# Patient Record
Sex: Female | Born: 1995 | Race: Black or African American | Hispanic: No | Marital: Married | Smoking: Never smoker
Health system: Southern US, Community
[De-identification: ages and names within clinical notes are randomized; demographics above are authoritative.]

## PROBLEM LIST (undated history)

## (undated) ENCOUNTER — Inpatient Hospital Stay (HOSPITAL_COMMUNITY): Payer: Self-pay

## (undated) ENCOUNTER — Ambulatory Visit: Admission: EM | Payer: BC Managed Care – PPO | Source: Home / Self Care

## (undated) DIAGNOSIS — R519 Headache, unspecified: Secondary | ICD-10-CM

## (undated) DIAGNOSIS — R51 Headache: Secondary | ICD-10-CM

## (undated) DIAGNOSIS — E559 Vitamin D deficiency, unspecified: Secondary | ICD-10-CM

## (undated) HISTORY — DX: Headache, unspecified: R51.9

## (undated) HISTORY — PX: NO PAST SURGERIES: SHX2092

## (undated) HISTORY — DX: Headache: R51

## (undated) HISTORY — DX: Vitamin D deficiency, unspecified: E55.9

---
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
## 2013-11-24 ENCOUNTER — Emergency Department (HOSPITAL_COMMUNITY): Payer: Medicaid Other

## 2013-11-24 ENCOUNTER — Emergency Department (HOSPITAL_COMMUNITY)
Admission: EM | Admit: 2013-11-24 | Discharge: 2013-11-24 | Disposition: A | Discharge status: Home / Self Care | Payer: Medicaid Other | Attending: Emergency Medicine | Admitting: Emergency Medicine

## 2013-11-24 ENCOUNTER — Encounter (HOSPITAL_COMMUNITY): Payer: Self-pay | Admitting: Emergency Medicine

## 2013-11-24 DIAGNOSIS — R040 Epistaxis: Secondary | ICD-10-CM | POA: Insufficient documentation

## 2013-11-24 DIAGNOSIS — Z79899 Other long term (current) drug therapy: Secondary | ICD-10-CM | POA: Insufficient documentation

## 2013-11-24 DIAGNOSIS — J3489 Other specified disorders of nose and nasal sinuses: Secondary | ICD-10-CM | POA: Insufficient documentation

## 2013-11-24 DIAGNOSIS — R51 Headache: Secondary | ICD-10-CM | POA: Insufficient documentation

## 2013-11-24 MED ORDER — OXYMETAZOLINE HCL 0.05 % NA SOLN: 1.0000 | Freq: Two times a day (BID) | NASAL | Status: DC

## 2013-11-24 NOTE — ED Notes (Signed)
Patient presents with c/o nosebleed off and on and has a headache

## 2013-11-24 NOTE — ED Provider Notes (Signed)
CSN: [id number]     Arrival date & time 11/24/13  0445 History   First MD Initiated Contact with Patient 11/24/13 [phone]     Chief Complaint  Patient presents with  . Epistaxis  . Headache     (Consider location/radiation/quality/duration/timing/severity/associated sxs/prior Treatment) HPI Comments: Patient presents with intermittent nosebleeds for the past 2 days. States she has noticed blood with clots from her right nostril mostly that is controlled with pressure. No bleeding currently. She endorses gradual onset headache and no focal weakness, numbness or tingling. No fever. Denies any recent illness. Denies any sore throat, runny nose, chest pain or shortness of breath.  The history is provided by the patient.    History reviewed. No pertinent past medical history. History reviewed. No pertinent past surgical history. History reviewed. No pertinent family history. History  Substance Use Topics  . Smoking status: Never Smoker   . Smokeless tobacco: Never Used  . Alcohol Use: No   OB History   Grav Para Term Preterm Abortions TAB SAB Ect Mult Living   1 1 1       1      Review of Systems  Constitutional: Negative for fever, activity change and appetite change.  HENT: Positive for congestion, nosebleeds and rhinorrhea.   Respiratory: Negative for cough, chest tightness and shortness of breath.   Cardiovascular: Negative for chest pain.  Gastrointestinal: Negative for nausea, vomiting and abdominal pain.  Genitourinary: Negative for dysuria, hematuria, vaginal bleeding and vaginal discharge.  Musculoskeletal: Negative for arthralgias and myalgias.  Skin: Negative for rash.  Neurological: Positive for headaches. Negative for dizziness and weakness.  A complete 10 system review of systems was obtained and all systems are negative except as noted in the HPI and PMH.      Allergies  Review of patient's allergies indicates no known allergies.  Home Medications   Prior to  Admission medications   Medication Sig Start Date End Date Taking? Authorizing Provider  oxymetazoline (AFRIN NASAL SPRAY) 0.05 % nasal spray Place 1 spray into both nostrils 2 (two) times daily. 11/24/13   Glynn OctaveStephen Ciani Rutten, MD   BP 118/65  Pulse 66  Temp(Src) 98.3 F (36.8 C) (Oral)  Resp 15  Ht 5\' 6"  (1.676 m)  Wt 178 lb (80.74 kg)  BMI 28.74 kg/m2  SpO2 99%  LMP 11/24/2013 Physical Exam  Nursing note and vitals reviewed. Constitutional: She is oriented to person, place, and time. She appears well-developed and well-nourished. No distress.  HENT:  Head: Normocephalic and atraumatic.  Right Ear: External ear normal.  Left Ear: External ear normal.  Mouth/Throat: Oropharynx is clear and moist. No oropharyngeal exudate.  No active bleeding from bilateral nares. No evidence of recent bleed. No septal hematoma. Oropharynx is clear.  Eyes: Conjunctivae and EOM are normal. Pupils are equal, round, and reactive to light.  Neck: Normal range of motion. Neck supple.  No meningismus.  Cardiovascular: Normal rate, regular rhythm, normal heart sounds and intact distal pulses.   No murmur heard. Pulmonary/Chest: Effort normal and breath sounds normal. No respiratory distress.  Abdominal: Soft. There is no tenderness. There is no rebound and no guarding.  Musculoskeletal: Normal range of motion. She exhibits no edema and no tenderness.  Neurological: She is alert and oriented to person, place, and time. No cranial nerve deficit. She exhibits normal muscle tone. Coordination normal.  No ataxia on finger to nose bilaterally. No pronator drift. 5/5 strength throughout. CN 2-12 intact. Negative Romberg. Equal grip strength. Sensation intact.  Gait is normal.   Skin: Skin is warm.  Psychiatric: She has a normal mood and affect. Her behavior is normal.    ED Course  Procedures (including critical care time) Labs Review Labs Reviewed - No data to display  Imaging Review Dg Chest 2  View  11/24/2013   CLINICAL DATA:  Headache.  Epistaxis.  EXAM: CHEST  2 VIEW  COMPARISON:  None.  FINDINGS: The heart size and mediastinal contours are within normal limits. Both lungs are clear. No pleural effusion or pneumothorax. The visualized skeletal structures are unremarkable.  IMPRESSION: Normal chest radiographs.   Electronically Signed   By: Amie Portland M.D.   On: 11/24/2013 09:08     EKG Interpretation None      MDM   Final diagnoses:  Epistaxis   Intermittent nosebleeds for the past 2 days. Denies trauma. No anticoagulant use. Bleeding controlled on arrival.  No evidence of septal hematoma or hemotympanum.   Patient in no distress  Supportive care and with Afrin and nasal saline. Follow up with PCP.  Glynn Octave, MD 11/24/13 [phone]

## 2013-11-24 NOTE — Discharge Instructions (Signed)

## 2014-02-10 ENCOUNTER — Encounter (HOSPITAL_COMMUNITY): Payer: Self-pay | Admitting: Emergency Medicine

## 2014-06-04 ENCOUNTER — Other Ambulatory Visit: Payer: Self-pay | Admitting: Obstetrics and Gynecology

## 2014-06-05 LAB — CYTOLOGY - PAP

## 2014-07-02 ENCOUNTER — Other Ambulatory Visit: Payer: Self-pay | Admitting: Obstetrics and Gynecology

## 2014-07-24 ENCOUNTER — Ambulatory Visit (INDEPENDENT_AMBULATORY_CARE_PROVIDER_SITE_OTHER): Payer: BLUE CROSS/BLUE SHIELD | Admitting: Neurology

## 2014-07-24 ENCOUNTER — Encounter: Payer: Self-pay | Admitting: Neurology

## 2014-07-24 VITALS — BP 123/79 | HR 73 | Ht 66.0 in | Wt 192.0 lb

## 2014-07-24 DIAGNOSIS — G43009 Migraine without aura, not intractable, without status migrainosus: Secondary | ICD-10-CM | POA: Insufficient documentation

## 2014-07-24 MED ORDER — NORTRIPTYLINE HCL 10 MG PO CAPS: ORAL_CAPSULE | ORAL | Status: DC

## 2014-07-24 MED ORDER — RIZATRIPTAN BENZOATE 5 MG PO TBDP: 5.0000 mg | ORAL_TABLET | ORAL | Status: DC | PRN

## 2014-07-24 NOTE — Progress Notes (Signed)
PATIENT: Wendy Gutierrez DOB: 08/05/1995  HISTORICAL  Wendy Gutierrez is a 19 yo RH AAM, by her gynecologist. Wendy Gutierrez for evaluation of headaches  She denied previous history of headaches, since March 2016, she began to have headaches, initial headache last for 2 weeks, holoacranial severe pounding headaches with associated light noise sensitivity, nauseous, she denies visual change, denies hearing loss, headache gradually improved,  However over the past 1 week, she also had intermittent moderate to severe headaches 2 days in a row, during intense headaches, she saw color distortion,  She is a mother of 89 years old, works at irregular hours, lack of sleep, complains of weight gain, blurry vision,   Laboratory evaluation in February 2016 showed normal TSH, CBC CMP, B12, A1c 5.8, mildly low vitamin D 11  REVIEW OF SYSTEMS: Full 14 system review of systems performed and notable only for not enough sleep, headaches, dizziness, increased thirst, frequent urination, blurry vision, weight gain ALLERGIES: No Known Allergies  HOME MEDICATIONS: Current Outpatient Prescriptions  Medication Sig Dispense Refill  . ergocalciferol (VITAMIN D2) 50000 UNITS capsule Take 50,000 Units by mouth once a week.       PAST MEDICAL HISTORY: Past Medical History  Diagnosis Date  . Vitamin D deficiency   . Headache     PAST SURGICAL HISTORY: Past Surgical History  Procedure Laterality Date  . No past surgeries      FAMILY HISTORY: Family History  Problem Relation Age of Onset  . Healthy Mother   . Healthy Father     SOCIAL HISTORY:  History   Social History  . Marital Status: Single    Spouse Name: N/A  . Number of Children: 0  . Years of Education: 13   Occupational History  . Bakery   . Hostess    Social History Main Topics  . Smoking status: Never Smoker   . Smokeless tobacco: Never Used  . Alcohol Use: No  . Drug Use: No  . Sexual Activity: Yes   Other Topics Concern  .  Not on file   Social History Narrative   Lives at home with mother.   Right-handed.   2-3 cups caffeine per day.   PHYSICAL EXAM   Filed Vitals:   07/24/14 0831  BP: 123/79  Pulse: 73  Height: 5\' 6"  (1.676 m)  Weight: 192 lb (87.091 kg)    Not recorded      Body mass index is 31 kg/(m^2).  PHYSICAL EXAMNIATION:  Gen: NAD, conversant, well nourised, obese, well groomed                     Cardiovascular: Regular rate rhythm, no peripheral edema, warm, nontender. Eyes: Conjunctivae clear without exudates or hemorrhage Neck: Supple, no carotid bruise. Pulmonary: Clear to auscultation bilaterally   NEUROLOGICAL EXAM:  MENTAL STATUS: Speech:    Speech is normal; fluent and spontaneous with normal comprehension.  Cognition:    The patient is oriented to person, place, and time;     recent and remote memory intact;     language fluent;     normal attention, concentration,     fund of knowledge.  CRANIAL NERVES: CN II: Visual fields are full to confrontation. Fundoscopic exam is normal with sharp discs and no vascular changes. Venous pulsations are present bilaterally. Pupils are 4 mm and briskly reactive to light. Visual acuity is 20/20 bilaterally. CN III, IV, VI: extraocular movement are normal. No ptosis. CN V: Facial sensation is intact  to pinprick in all 3 divisions bilaterally. Corneal responses are intact.  CN VII: Face is symmetric with normal eye closure and smile. CN VIII: Hearing is normal to rubbing fingers CN IX, X: Palate elevates symmetrically. Phonation is normal. CN XI: Head turning and shoulder shrug are intact CN XII: Tongue is midline with normal movements and no atrophy.  MOTOR: There is no pronator drift of out-stretched arms. Muscle bulk and tone are normal. Muscle strength is normal.   Shoulder abduction Shoulder external rotation Elbow flexion Elbow extension Wrist flexion Wrist extension Finger abduction Hip flexion Knee flexion Knee extension  Ankle dorsi flexion Ankle plantar flexion  R L REFLEXES: Reflexes are 2+ and symmetric at the biceps, triceps, knees, and ankles. Plantar responses are flexor.  SENSORY: Light touch, pinprick, position sense, and vibration sense are intact in fingers and toes.  COORDINATION: Rapid alternating movements and fine finger movements are intact. There is no dysmetria on finger-to-nose and heel-knee-shin. There are no abnormal or extraneous movements.   GAIT/STANCE: Posture is normal. Gait is steady with normal steps, base, arm swing, and turning. Heel and toe walking are normal. Tandem gait is normal.  Romberg is absent.   DIAGNOSTIC DATA (LABS, IMAGING, TESTING) - I reviewed patient records, labs, notes, testing and imaging myself where available.  Lab Results  Component Value Date   WBC 28.0* 12/06/2011   HGB 11.4* 12/06/2011   HCT 34.1* 12/06/2011   MCV 86.3 12/06/2011   PLT 123* 12/06/2011      Component Value Date/Time   NA 138 07/03/2011 2328   K 4.0 07/03/2011 2328   CL 107 07/03/2011 2328   GLUCOSE 82 07/03/2011 2328   BUN 9 07/03/2011 2328   CREATININE 0.60 07/03/2011 2328    ASSESSMENT AND PLAN  Teriann Livingood is a 19 y.o. female  with migraine headaches, normal neurological examination, I do not see papillary edema today  1, preventive medications nortriptyline, titrating to 10 mg 2 tablets every night 2. Maxalt as needed 3. Return to clinic in 1-2 months  Levert Feinstein, M.D. Ph.D.  [facility] [address], [address] [place], [place] [address] Ph: [phone] Fax: [phone]

## 2014-09-04 ENCOUNTER — Telehealth: Payer: Self-pay | Admitting: *Deleted

## 2014-09-04 ENCOUNTER — Ambulatory Visit: Payer: BLUE CROSS/BLUE SHIELD | Admitting: Neurology

## 2014-09-04 NOTE — Telephone Encounter (Signed)
Patient no showed her appt today

## 2014-09-05 ENCOUNTER — Encounter: Payer: Self-pay | Admitting: Neurology

## 2014-09-24 ENCOUNTER — Ambulatory Visit: Payer: BLUE CROSS/BLUE SHIELD | Admitting: Neurology

## 2014-09-24 ENCOUNTER — Telehealth: Payer: Self-pay | Admitting: *Deleted

## 2014-09-24 NOTE — Telephone Encounter (Signed)
No showed follow up appt. 

## 2014-09-25 ENCOUNTER — Encounter: Payer: Self-pay | Admitting: Neurology

## 2015-06-01 ENCOUNTER — Emergency Department (HOSPITAL_COMMUNITY)
Admission: EM | Admit: 2015-06-01 | Discharge: 2015-06-01 | Disposition: A | Discharge status: Home / Self Care | Payer: Medicaid Other | Attending: Emergency Medicine | Admitting: Emergency Medicine

## 2015-06-01 ENCOUNTER — Encounter (HOSPITAL_COMMUNITY): Payer: Self-pay | Admitting: Family Medicine

## 2015-06-01 DIAGNOSIS — B349 Viral infection, unspecified: Secondary | ICD-10-CM

## 2015-06-01 DIAGNOSIS — Z79899 Other long term (current) drug therapy: Secondary | ICD-10-CM | POA: Insufficient documentation

## 2015-06-01 DIAGNOSIS — R61 Generalized hyperhidrosis: Secondary | ICD-10-CM | POA: Insufficient documentation

## 2015-06-01 DIAGNOSIS — J069 Acute upper respiratory infection, unspecified: Secondary | ICD-10-CM

## 2015-06-01 DIAGNOSIS — E559 Vitamin D deficiency, unspecified: Secondary | ICD-10-CM | POA: Insufficient documentation

## 2015-06-01 MED ORDER — FLUTICASONE PROPIONATE 50 MCG/ACT NA SUSP: 2.0000 | Freq: Every day | NASAL | Status: DC

## 2015-06-01 MED ORDER — ALBUTEROL SULFATE HFA 108 (90 BASE) MCG/ACT IN AERS
2.0000 | INHALATION_SPRAY | Freq: Once | RESPIRATORY_TRACT | Status: AC
  Administered 2015-06-01: 2 via RESPIRATORY_TRACT
  Filled 2015-06-01: qty 6.7

## 2015-06-01 MED ORDER — CETIRIZINE-PSEUDOEPHEDRINE ER 5-120 MG PO TB12: 1.0000 | ORAL_TABLET | Freq: Two times a day (BID) | ORAL | Status: DC

## 2015-06-01 MED ORDER — BENZONATATE 100 MG PO CAPS: 100.0000 mg | ORAL_CAPSULE | Freq: Three times a day (TID) | ORAL | Status: DC | PRN

## 2015-06-01 MED ORDER — NAPROXEN 500 MG PO TABS: 500.0000 mg | ORAL_TABLET | Freq: Two times a day (BID) | ORAL | Status: DC

## 2015-06-01 MED ORDER — SALINE SPRAY 0.65 % NA SOLN: 1.0000 | NASAL | Status: DC | PRN

## 2015-06-01 NOTE — ED Provider Notes (Signed)
CSN: [id number]     Arrival date & time 06/01/15  1834 History  By signing my name below, I, Wendy Gutierrez, attest that this documentation has been prepared under the direction and in the presence of [employer], PA-C. Electronically Signed: Gonzella Gutierrez, Scribe. 06/01/2015. 9:33 PM.   Chief Complaint  Patient presents with  . Nasal Congestion  . Cough  . Headache   The history is provided by the patient. No language interpreter was used.   HPI Comments: Wendy Gutierrez is a 20 y.o. female who presents to the Emergency Department complaining of sudden onset, constant, mild nasal congestion, nonproductive cough, diaphoresis, and HA which began three days ago. She also reports associated SOB, chills, generalized body aches, and mild sore throat in the morning. Pt has tried taking Theraflu, Tylenol, and Mucinex with no relief. She also notes that her niece was told four days ago that she had the flu. Pt denies vomiting and diarrhea. She also denies hx of asthma.  Past Medical History  Diagnosis Date  . Vitamin D deficiency   . Headache    Past Surgical History  Procedure Laterality Date  . No past surgeries     Family History  Problem Relation Age of Onset  . Healthy Mother   . Healthy Father    Social History  Substance Use Topics  . Smoking status: Never Smoker   . Smokeless tobacco: Never Used  . Alcohol Use: No   OB History    Gravida Para Term Preterm AB TAB SAB Ectopic Multiple Living   Review of Systems  Constitutional: Positive for chills and diaphoresis.  HENT: Positive for congestion and sore throat.   Respiratory: Positive for cough and shortness of breath.   Gastrointestinal: Negative for vomiting and diarrhea.  Musculoskeletal: Positive for myalgias ( generalized body aches).  Neurological: Positive for headaches.  All other systems reviewed and are negative.   Allergies  Review of patient's allergies indicates no known  allergies.  Home Medications   Prior to Admission medications   Medication Sig Start Date End Date Taking? Authorizing Provider  ergocalciferol (VITAMIN D2) 50000 UNITS capsule Take 50,000 Units by mouth once a week.    Historical Provider, MD  nortriptyline (PAMELOR) 10 MG capsule One po qhs xone week, then 2 tabs po qhs 07/24/14   Levert Feinstein, MD  rizatriptan (MAXALT-MLT) 5 MG disintegrating tablet Take 1 tablet (5 mg total) by mouth as needed. May repeat in 2 hours if needed 07/24/14   Levert Feinstein, MD  UNABLE TO FIND Med Name: Hydroxycut Weight Loss Tablets    Historical Provider, MD   BP 106/76 mmHg  Pulse 77  Temp(Src) 98.3 F (36.8 C) (Oral)  Resp 16  Ht  (1.651 m)  Wt 192 lb (87.091 kg)  BMI 31.95 kg/m2  SpO2 100%  LMP 05/18/2015   Physical Exam  Constitutional: She is oriented to person, place, and time. She appears well-developed and well-nourished. No distress.  Nontoxic/nonseptic appearing  HENT:  Head: Normocephalic and atraumatic.  Mouth/Throat: Oropharynx is clear and moist. No oropharyngeal exudate.  Oropharynx clear. Uvula midline. Patient tolerating secretions without difficulty.  Eyes: Conjunctivae and EOM are normal. No scleral icterus.  Neck: Normal range of motion.  No nuchal rigidity or meningismus  Cardiovascular: Normal rate, regular rhythm and intact distal pulses.   Pulmonary/Chest: Effort normal and breath sounds normal. No respiratory distress.  She has no wheezes. She has no rales.  No accessory muscle use, tachypnea, or dyspnea. Lungs clear to auscultation bilaterally. Chest expansion symmetric.  Musculoskeletal: Normal range of motion.  Neurological: She is alert and oriented to person, place, and time. She exhibits normal muscle tone. Coordination normal.  Ambulatory with steady gait  Skin: Skin is warm and dry. No rash noted. She is not diaphoretic. No erythema. No pallor.  Psychiatric: She has a normal mood and affect. Her behavior is normal.   Nursing note and vitals reviewed.   ED Course  Procedures  DIAGNOSTIC STUDIES:    Oxygen Saturation is 100% on RA, normal by my interpretation.   COORDINATION OF CARE:  9:33 PM Will prescribe pt nasal spray and inhaler. Discussed treatment plan with pt at bedside and pt agreed to plan.   MDM   Final diagnoses:  Viral illness  Acute URI    Patient with symptoms consistent with viral illness, possible influenza. Vitals are stable, patient afebrile. No signs of dehydration, tolerating PO's. Lungs are clear. Patient out of the window for tx with Tamiflu. She will be discharged with instructions to orally hydrate, rest, and use over-the-counter medications such as anti-inflammatories ibuprofen and Aleve for muscle aches and Tylenol for fever.  Patient will also be given a cough suppressant.   I personally performed the services described in this documentation, which was scribed in my presence. The recorded information has been reviewed and is accurate.    Filed Vitals:   06/01/15 2008 06/01/15 2009 06/01/15 2201  BP: 106/76  119/69  Pulse: 77  72  Temp: 98.3 F (36.8 C)    TempSrc: Oral    Resp: 16  16  Height:   (1.651 m)   Weight:  87.091 kg   SpO2: 100%  99%    Antony Madura, PA-C 06/02/15 1907  Bethann Berkshire, MD 06/04/15 [phone]

## 2015-06-01 NOTE — ED Notes (Signed)
Patient is complaining of nasal congestion, non-productive cough, sweating, and headache. Symptoms started on Friday. Pt has tried Thera-Flu, Tylenol, and Mucinex.

## 2015-06-01 NOTE — Discharge Instructions (Signed)
Upper Respiratory Infection, Adult Most upper respiratory infections (URIs) are a viral infection of the air passages leading to the lungs. A URI affects the nose, throat, and upper air passages. The most common type of URI is nasopharyngitis and is typically referred to as "the common cold." URIs run their course and usually go away on their own. Most of the time, a URI does not require medical attention, but sometimes a bacterial infection in the upper airways can follow a viral infection. This is called a secondary infection. Sinus and middle ear infections are common types of secondary upper respiratory infections. Bacterial pneumonia can also complicate a URI. A URI can worsen asthma and chronic obstructive pulmonary disease (COPD). Sometimes, these complications can require emergency medical care and may be life threatening.  CAUSES Almost all URIs are caused by viruses. A virus is a type of germ and can spread from one person to another.  RISKS FACTORS You may be at risk for a URI if:   You smoke.   You have chronic heart or lung disease.  You have a weakened defense (immune) system.   You are very young or very old.   You have nasal allergies or asthma.  You work in crowded or poorly ventilated areas.  You work in health care facilities or schools. SIGNS AND SYMPTOMS  Symptoms typically develop 2-3 days after you come in contact with a cold virus. Most viral URIs last 7-10 days. However, viral URIs from the influenza virus (flu virus) can last 14-18 days and are typically more severe. Symptoms may include:   Runny or stuffy (congested) nose.   Sneezing.   Cough.   Sore throat.   Headache.   Fatigue.   Fever.   Loss of appetite.   Pain in your forehead, behind your eyes, and over your cheekbones (sinus pain).  Muscle aches.  DIAGNOSIS  Your health care provider may diagnose a URI by:  Physical exam.  Tests to check that your symptoms are not due to  another condition such as:  Strep throat.  Sinusitis.  Pneumonia.  Asthma. TREATMENT  A URI goes away on its own with time. It cannot be cured with medicines, but medicines may be prescribed or recommended to relieve symptoms. Medicines may help:  Reduce your fever.  Reduce your cough.  Relieve nasal congestion. HOME CARE INSTRUCTIONS   Take medicines only as directed by your health care provider.   Gargle warm saltwater or take cough drops to comfort your throat as directed by your health care provider.  Use a warm mist humidifier or inhale steam from a shower to increase air moisture. This may make it easier to breathe.  Drink enough fluid to keep your urine clear or pale yellow.   Eat soups and other clear broths and maintain good nutrition.   Rest as needed.   Return to work when your temperature has returned to normal or as your health care provider advises. You may need to stay home longer to avoid infecting others. You can also use a face mask and careful hand washing to prevent spread of the virus.  Increase the usage of your inhaler if you have asthma.   Do not use any tobacco products, including cigarettes, chewing tobacco, or electronic cigarettes. If you need help quitting, ask your health care provider. PREVENTION  The best way to protect yourself from getting a cold is to practice good hygiene.   Avoid oral or hand contact with people with cold   symptoms.   Wash your hands often if contact occurs.  There is no clear evidence that vitamin C, vitamin E, echinacea, or exercise reduces the chance of developing a cold. However, it is always recommended to get plenty of rest, exercise, and practice good nutrition.  SEEK MEDICAL CARE IF:   You are getting worse rather than better.   Your symptoms are not controlled by medicine.   You have chills.  You have worsening shortness of breath.  You have brown or red mucus.  You have yellow or brown nasal  discharge.  You have pain in your face, especially when you bend forward.  You have a fever.  You have swollen neck glands.  You have pain while swallowing.  You have white areas in the back of your throat. SEEK IMMEDIATE MEDICAL CARE IF:   You have severe or persistent:  Headache.  Ear pain.  Sinus pain.  Chest pain.  You have chronic lung disease and any of the following:  Wheezing.  Prolonged cough.  Coughing up blood.  A change in your usual mucus.  You have a stiff neck.  You have changes in your:  Vision.  Hearing.  Thinking.  Mood. MAKE SURE YOU:   Understand these instructions.  Will watch your condition.  Will get help right away if you are not doing well or get worse.   This information is not intended to replace advice given to you by your health care provider. Make sure you discuss any questions you have with your health care provider.   Document Released: 09/21/2000 Document Revised: 08/12/2014 Document Reviewed: 07/03/2013 Elsevier Interactive Patient Education 2016 [employer]  

## 2016-09-29 DIAGNOSIS — Z3490 Encounter for supervision of normal pregnancy, unspecified, unspecified trimester: Secondary | ICD-10-CM | POA: Insufficient documentation

## 2016-10-10 DIAGNOSIS — R87612 Low grade squamous intraepithelial lesion on cytologic smear of cervix (LGSIL): Secondary | ICD-10-CM | POA: Insufficient documentation

## 2016-11-09 ENCOUNTER — Encounter (HOSPITAL_COMMUNITY): Payer: Self-pay | Admitting: *Deleted

## 2016-11-09 ENCOUNTER — Inpatient Hospital Stay (HOSPITAL_COMMUNITY)
Admission: AD | Admit: 2016-11-09 | Discharge: 2016-11-09 | Disposition: A | Discharge status: Home / Self Care | Payer: Medicaid Other | Source: Ambulatory Visit | Attending: Family Medicine | Admitting: Family Medicine

## 2016-11-09 DIAGNOSIS — R109 Unspecified abdominal pain: Secondary | ICD-10-CM

## 2016-11-09 DIAGNOSIS — O26892 Other specified pregnancy related conditions, second trimester: Secondary | ICD-10-CM

## 2016-11-09 DIAGNOSIS — O23592 Infection of other part of genital tract in pregnancy, second trimester: Secondary | ICD-10-CM | POA: Diagnosis not present

## 2016-11-09 DIAGNOSIS — Z3A14 14 weeks gestation of pregnancy: Secondary | ICD-10-CM | POA: Insufficient documentation

## 2016-11-09 DIAGNOSIS — R1030 Lower abdominal pain, unspecified: Secondary | ICD-10-CM | POA: Insufficient documentation

## 2016-11-09 DIAGNOSIS — B9689 Other specified bacterial agents as the cause of diseases classified elsewhere: Secondary | ICD-10-CM | POA: Diagnosis not present

## 2016-11-09 DIAGNOSIS — N760 Acute vaginitis: Secondary | ICD-10-CM

## 2016-11-09 DIAGNOSIS — O26899 Other specified pregnancy related conditions, unspecified trimester: Secondary | ICD-10-CM

## 2016-11-09 LAB — URINALYSIS, ROUTINE W REFLEX MICROSCOPIC
BACTERIA UA: NONE SEEN
Bilirubin Urine: NEGATIVE
GLUCOSE, UA: NEGATIVE mg/dL
Hgb urine dipstick: NEGATIVE
KETONES UR: NEGATIVE mg/dL
Nitrite: NEGATIVE
Protein, ur: NEGATIVE mg/dL
Specific Gravity, Urine: 1.027 (ref 1.005–1.030)
pH: 5 (ref 5.0–8.0)

## 2016-11-09 LAB — WET PREP, GENITAL
SPERM: NONE SEEN
TRICH WET PREP: NONE SEEN
Yeast Wet Prep HPF POC: NONE SEEN

## 2016-11-09 MED ORDER — METRONIDAZOLE 500 MG PO TABS: 500.0000 mg | ORAL_TABLET | Freq: Two times a day (BID) | ORAL | 0 refills | Status: DC

## 2016-11-09 NOTE — MAU Note (Signed)
Over 2 wks, pain in lower abd.  Mainly when she is standing and walking around.  Sometimes will have a little in her back.

## 2016-11-09 NOTE — Progress Notes (Signed)
Reviewed d/c papers regarding treatment for BV and [facility]

## 2016-11-09 NOTE — MAU Provider Note (Signed)
History     CSN: [id number]  Arrival date and time: 11/09/16 1211   First Provider Initiated Contact with Patient 11/09/16 1402      Chief Complaint  Patient presents with  . Abdominal Pain   HPI  [name] is 21 yo G2P1001 at 14.[redacted] wks gestation presenting with complaints of lower abdominal pain x 2 wks.  She states the pain increases with standing and walking.  She also complains of occasional back pain.  Patient and spouse verbalizes "need to leave immediately to be somewhere else".   Past Medical History:  Diagnosis Date  . Headache   . Vitamin D deficiency     Past Surgical History:  Procedure Laterality Date  . NO PAST SURGERIES      Family History  Problem Relation Age of Onset  . Healthy Mother   . Healthy Father     Social History  Substance Use Topics  . Smoking status: Never Smoker  . Smokeless tobacco: Never Used  . Alcohol use No    Allergies: No Known Allergies  Prescriptions Prior to Admission  Medication Sig Dispense Refill Last Dose  . Prenatal Vit-Fe Fumarate-FA (PRENATAL MULTIVITAMIN) TABS tablet Take 1 tablet by mouth daily at 12 noon.   Past Week at Unknown time  . benzonatate (TESSALON) 100 MG capsule Take 1 capsule (100 mg total) by mouth 3 (three) times daily as needed for cough. 21 capsule 0   . cetirizine-pseudoephedrine (ZYRTEC-D) 5-120 MG tablet Take 1 tablet by mouth 2 (two) times daily. 14 tablet 0   . ergocalciferol (VITAMIN D2) 50000 UNITS capsule Take 50,000 Units by mouth once a week.   Taking  . fluticasone (FLONASE) 50 MCG/ACT nasal spray Place 2 sprays into both nostrils daily. 16 g 0   . naproxen (NAPROSYN) 500 MG tablet Take 1 tablet (500 mg total) by mouth 2 (two) times daily. 30 tablet 0   . nortriptyline (PAMELOR) 10 MG capsule One po qhs xone week, then 2 tabs po qhs 60 capsule 11   . rizatriptan (MAXALT-MLT) 5 MG disintegrating tablet Take 1 tablet (5 mg total) by mouth as needed. May repeat in 2 hours if needed  15 tablet 6   . sodium chloride (OCEAN) 0.65 % SOLN nasal spray Place 1 spray into both nostrils as needed for congestion. 1 Bottle 0   . UNABLE TO FIND Med Name: Hydroxycut Weight Loss Tablets   Taking    Review of Systems  Constitutional: Negative.   HENT: Negative.   Eyes: Negative.   Respiratory: Negative.   Cardiovascular: Negative.   Gastrointestinal: Positive for abdominal pain.  Endocrine: Negative.   Genitourinary: Positive for pelvic pain.  Musculoskeletal: Negative.   Skin: Negative.   Allergic/Immunologic: Negative.   Neurological: Negative.   Hematological: Negative.   Psychiatric/Behavioral: Negative.    Physical Exam   Blood pressure 115/63, pulse 79, temperature 98.6 F (37 C), temperature source Oral, resp. rate 16, weight 202 lb 12 oz (92 kg), SpO2 100 %, unknown if currently breastfeeding.  Physical Exam  Constitutional: She is oriented to person, place, and time. She appears well-developed and well-nourished.  HENT:  Head: Normocephalic.  Eyes: Pupils are equal, round, and reactive to light.  Neck: Normal range of motion.  Cardiovascular: Normal rate, regular rhythm and normal heart sounds.   Respiratory: Effort normal and breath sounds normal.  GI: Soft. Bowel sounds are normal.  Genitourinary:  Genitourinary Comments: Uterus: gravid, S=D, cx; smooth, pink, no lesions, moederate amt  of thick, white d/c, closed/long/firm, no CMT or friability, no adnexal tenderness   Musculoskeletal: Normal range of motion.  Neurological: She is alert and oriented to person, place, and time. She has normal reflexes.  Skin: Skin is warm and dry.  Psychiatric: She has a normal mood and affect. Her behavior is normal. Judgment and thought content normal.    MAU Course  Procedures  MDM CCUA Wet Prep GC/CT HIV (+) FHTS by doppler 155 bpm  Results for orders placed or performed during the hospital encounter of 11/09/16 (from the past 24 hour(s))  Urinalysis,  Routine w reflex microscopic     Status: Abnormal   Collection Time: 11/09/16 12:24 PM  Result Value Ref Range   Color, Urine YELLOW YELLOW   APPearance HAZY (A) CLEAR   Specific Gravity, Urine 1.027 1.005 - 1.030   pH 5.0 5.0 - 8.0   Glucose, UA NEGATIVE NEGATIVE mg/dL   Hgb urine dipstick NEGATIVE NEGATIVE   Bilirubin Urine NEGATIVE NEGATIVE   Ketones, ur NEGATIVE NEGATIVE mg/dL   Protein, ur NEGATIVE NEGATIVE mg/dL   Nitrite NEGATIVE NEGATIVE   Leukocytes, UA TRACE (A) NEGATIVE   RBC / HPF 0-5 0 - 5 RBC/hpf   WBC, UA 0-5 0 - 5 WBC/hpf   Bacteria, UA NONE SEEN NONE SEEN   Squamous Epithelial / LPF 0-5 (A) NONE SEEN   Mucous PRESENT   Wet prep, genital     Status: Abnormal   Collection Time: 11/09/16  2:00 PM  Result Value Ref Range   Yeast Wet Prep HPF POC NONE SEEN NONE SEEN   Trich, Wet Prep NONE SEEN NONE SEEN   Clue Cells Wet Prep HPF POC PRESENT (A) NONE SEEN   WBC, Wet Prep HPF POC FEW (A) NONE SEEN   Sperm NONE SEEN    Assessment and Plan  Abdominal pain affecting pregnancy - Instructions on abd pain in preg given - Keep scheduled appt with [facility] on 8/6  Bacterial vaginitis - Rx for Flagyl 500 mg BID x 7 days - Instructions on BV given  Discharge home  Patient verbalized an understanding of the plan of care and agrees.  Wendy Moraolitta Winnona Wargo, MSN, CNM 11/09/2016, 2:02 PM

## 2016-11-09 NOTE — Progress Notes (Signed)
Wendy Gutierrez CNM discussed d/c plan with pt after exam. Written and verbal d/c instructions given and understanding voiced.

## 2016-11-11 LAB — GC/CHLAMYDIA PROBE AMP (~~LOC~~) NOT AT ARMC
Chlamydia: NEGATIVE
NEISSERIA GONORRHEA: NEGATIVE

## 2016-11-14 ENCOUNTER — Encounter: Payer: Self-pay | Admitting: Obstetrics and Gynecology

## 2016-11-14 ENCOUNTER — Ambulatory Visit (INDEPENDENT_AMBULATORY_CARE_PROVIDER_SITE_OTHER): Payer: Medicaid Other | Admitting: Obstetrics and Gynecology

## 2016-11-14 DIAGNOSIS — Z3480 Encounter for supervision of other normal pregnancy, unspecified trimester: Secondary | ICD-10-CM

## 2016-11-14 DIAGNOSIS — O99210 Obesity complicating pregnancy, unspecified trimester: Secondary | ICD-10-CM

## 2016-11-14 DIAGNOSIS — E669 Obesity, unspecified: Secondary | ICD-10-CM

## 2016-11-14 DIAGNOSIS — Z3482 Encounter for supervision of other normal pregnancy, second trimester: Secondary | ICD-10-CM

## 2016-11-14 DIAGNOSIS — O99212 Obesity complicating pregnancy, second trimester: Secondary | ICD-10-CM

## 2016-11-14 DIAGNOSIS — R87612 Low grade squamous intraepithelial lesion on cytologic smear of cervix (LGSIL): Secondary | ICD-10-CM | POA: Insufficient documentation

## 2016-11-14 MED ORDER — COMFORT FIT MATERNITY SUPP MED MISC: 0 refills | Status: DC

## 2016-11-14 NOTE — Progress Notes (Signed)
  Subjective:    Wendy Gutierrez is a G2P1001 6659w2d being seen today for her first obstetrical visit.  Her obstetrical history is significant for other normal pregnancy. Patient does intend to breast feed. Pregnancy history fully reviewed.  Patient reports pelvic and lower back pain.  Vitals:   11/14/16 1421  BP: 119/70  Pulse: 85  Weight: 202 lb 14.4 oz (92 kg)    HISTORY: OB History  Gravida Para Term Preterm AB Living  2 1 1     1   SAB TAB Ectopic Multiple Live Births          1    # Outcome Date GA Lbr Len/2nd Weight Sex Delivery Anes PTL Lv  2 Current           1 Term 12/05/11 6952w5d 14:00 / 00:28 6 lb 15.6 oz (3.165 kg) F Vag-Spont EPI  LIV     Past Medical History:  Diagnosis Date  . Headache   . Vitamin D deficiency    Past Surgical History:  Procedure Laterality Date  . NO PAST SURGERIES     Family History  Problem Relation Age of Onset  . Healthy Mother   . Healthy Father   . Breast cancer Maternal Grandmother      Exam    Uterus:   16- week size uterus      Assessment:    Pregnancy: G2P1001 Patient Active Problem List   Diagnosis Date Noted  . Encounter for supervision of other normal pregnancy, unspecified trimester 11/14/2016  . LGSIL on Pap smear of cervix 11/14/2016  . Migraine without aura and without status migrainosus, not intractable 07/24/2014        Plan:     Initial labs drawn. Prenatal vitamins. Problem list reviewed and updated. Pap smear and ob urine culture reviewed Rx pregnancy support belt provided to help with lower back discomfort. Also advised patient to stretch or join a maternity exercise class Genetic Screening discussed Quad Screen: ordered.  Ultrasound discussed; fetal survey: ordered.  Follow up in 4 weeks. 50% of 30 min visit spent on counseling and coordination of care.     Marshel Golubski 11/14/2016

## 2016-11-14 NOTE — Patient Instructions (Addendum)
 Second Trimester of Pregnancy The second trimester is from week 14 through week 27 (months 4 through 6). The second trimester is often a time when you feel your best. Your body has adjusted to being pregnant, and you begin to feel better physically. Usually, morning sickness has lessened or quit completely, you may have more energy, and you may have an increase in appetite. The second trimester is also a time when the fetus is growing rapidly. At the end of the sixth month, the fetus is about 9 inches long and weighs about 1 pounds. You will likely begin to feel the baby move (quickening) between 16 and 20 weeks of pregnancy. Body changes during your second trimester Your body continues to go through many changes during your second trimester. The changes vary from woman to woman.  Your weight will continue to increase. You will notice your lower abdomen bulging out.  You may begin to get stretch marks on your hips, abdomen, and breasts.  You may develop headaches that can be relieved by medicines. The medicines should be approved by your health care provider.  You may urinate more often because the fetus is pressing on your bladder.  You may develop or continue to have heartburn as a result of your pregnancy.  You may develop constipation because certain hormones are causing the muscles that push waste through your intestines to slow down.  You may develop hemorrhoids or swollen, bulging veins (varicose veins).  You may have back pain. This is caused by: ? Weight gain. ? Pregnancy hormones that are relaxing the joints in your pelvis. ? A shift in weight and the muscles that support your balance.  Your breasts will continue to grow and they will continue to become tender.  Your gums may bleed and may be sensitive to brushing and flossing.  Dark spots or blotches (chloasma, mask of pregnancy) may develop on your face. This will likely fade after the baby is born.  A dark line from  your belly button to the pubic area (linea nigra) may appear. This will likely fade after the baby is born.  You may have changes in your hair. These can include thickening of your hair, rapid growth, and changes in texture. Some women also have hair loss during or after pregnancy, or hair that feels dry or thin. Your hair will most likely return to normal after your baby is born.  What to expect at prenatal visits During a routine prenatal visit:  You will be weighed to make sure you and the fetus are growing normally.  Your blood pressure will be taken.  Your abdomen will be measured to track your baby's growth.  The fetal heartbeat will be listened to.  Any test results from the previous visit will be discussed.  Your health care provider may ask you:  How you are feeling.  If you are feeling the baby move.  If you have had any abnormal symptoms, such as leaking fluid, bleeding, severe headaches, or abdominal cramping.  If you are using any tobacco products, including cigarettes, chewing tobacco, and electronic cigarettes.  If you have any questions.  Other tests that may be performed during your second trimester include:  Blood tests that check for: ? Low iron levels (anemia). ? High blood sugar that affects pregnant women (gestational diabetes) between 24 and 28 weeks. ? Rh antibodies. This is to check for a protein on red blood cells (Rh factor).  Urine tests to check for infections, diabetes,   or protein in the urine.  An ultrasound to confirm the proper growth and development of the baby.  An amniocentesis to check for possible genetic problems.  Fetal screens for spina bifida and Down syndrome.  HIV (human immunodeficiency virus) testing. Routine prenatal testing includes screening for HIV, unless you choose not to have this test.  Follow these instructions at home: Medicines  Follow your health care provider's instructions regarding medicine use. Specific  medicines may be either safe or unsafe to take during pregnancy.  Take a prenatal vitamin that contains at least 600 micrograms (mcg) of folic acid.  If you develop constipation, try taking a stool softener if your health care provider approves. Eating and drinking  Eat a balanced diet that includes fresh fruits and vegetables, whole grains, good sources of protein such as meat, eggs, or tofu, and low-fat dairy. Your health care provider will help you determine the amount of weight gain that is right for you.  Avoid raw meat and uncooked cheese. These carry germs that can cause birth defects in the baby.  If you have low calcium intake from food, talk to your health care provider about whether you should take a daily calcium supplement.  Limit foods that are high in fat and processed sugars, such as fried and sweet foods.  To prevent constipation: ? Drink enough fluid to keep your urine clear or pale yellow. ? Eat foods that are high in fiber, such as fresh fruits and vegetables, whole grains, and beans. Activity  Exercise only as directed by your health care provider. Most women can continue their usual exercise routine during pregnancy. Try to exercise for 30 minutes at least 5 days a week. Stop exercising if you experience uterine contractions.  Avoid heavy lifting, wear low heel shoes, and practice good posture.  A sexual relationship may be continued unless your health care provider directs you otherwise. Relieving pain and discomfort  Wear a good support bra to prevent discomfort from breast tenderness.  Take warm sitz baths to soothe any pain or discomfort caused by hemorrhoids. Use hemorrhoid cream if your health care provider approves.  Rest with your legs elevated if you have leg cramps or low back pain.  If you develop varicose veins, wear support hose. Elevate your feet for 15 minutes, 3-4 times a day. Limit salt in your diet. Prenatal Care  Write down your questions.  Take them to your prenatal visits.  Keep all your prenatal visits as told by your health care provider. This is important. Safety  Wear your seat belt at all times when driving.  Make a list of emergency phone numbers, including numbers for family, friends, the hospital, and police and fire departments. General instructions  Ask your health care provider for a referral to a local prenatal education class. Begin classes no later than the beginning of month 6 of your pregnancy.  Ask for help if you have counseling or nutritional needs during pregnancy. Your health care provider can offer advice or refer you to specialists for help with various needs.  Do not use hot tubs, steam rooms, or saunas.  Do not douche or use tampons or scented sanitary pads.  Do not cross your legs for long periods of time.  Avoid cat litter boxes and soil used by cats. These carry germs that can cause birth defects in the baby and possibly loss of the fetus by miscarriage or stillbirth.  Avoid all smoking, herbs, alcohol, and unprescribed drugs. Chemicals in these products   can affect the formation and growth of the baby.  Do not use any products that contain nicotine or tobacco, such as cigarettes and e-cigarettes. If you need help quitting, ask your health care provider.  Visit your dentist if you have not gone yet during your pregnancy. Use a soft toothbrush to brush your teeth and be gentle when you floss. Contact a health care provider if:  You have dizziness.  You have mild pelvic cramps, pelvic pressure, or nagging pain in the abdominal area.  You have persistent nausea, vomiting, or diarrhea.  You have a bad smelling vaginal discharge.  You have pain when you urinate. Get help right away if:  You have a fever.  You are leaking fluid from your vagina.  You have spotting or bleeding from your vagina.  You have severe abdominal cramping or pain.  You have rapid weight gain or weight  loss.  You have shortness of breath with chest pain.  You notice sudden or extreme swelling of your face, hands, ankles, feet, or legs.  You have not felt your baby move in over an hour.  You have severe headaches that do not go away when you take medicine.  You have vision changes. Summary  The second trimester is from week 14 through week 27 (months 4 through 6). It is also a time when the fetus is growing rapidly.  Your body goes through many changes during pregnancy. The changes vary from woman to woman.  Avoid all smoking, herbs, alcohol, and unprescribed drugs. These chemicals affect the formation and growth your baby.  Do not use any tobacco products, such as cigarettes, chewing tobacco, and e-cigarettes. If you need help quitting, ask your health care provider.  Contact your health care provider if you have any questions. Keep all prenatal visits as told by your health care provider. This is important. This information is not intended to replace advice given to you by your health care provider. Make sure you discuss any questions you have with your health care provider. Document Released: 03/22/2001 Document Revised: 09/03/2015 Document Reviewed: 05/29/2012 [employer] Interactive Patient Education  2017 [employer]  Contraception Choices Contraception (birth control) is the use of any methods or devices to prevent pregnancy. Below are some methods to help avoid pregnancy. Hormonal methods  Contraceptive implant. This is a thin, plastic tube containing progesterone hormone. It does not contain estrogen hormone. Your health care provider inserts the tube in the inner part of the upper arm. The tube can remain in place for up to 3 years. After 3 years, the implant must be removed. The implant prevents the ovaries from releasing an egg (ovulation), thickens the cervical mucus to prevent sperm from entering the uterus, and thins the lining of the inside of the  uterus.  Progesterone-only injections. These injections are given every 3 months by your health care provider to prevent pregnancy. This synthetic progesterone hormone stops the ovaries from releasing eggs. It also thickens cervical mucus and changes the uterine lining. This makes it harder for sperm to survive in the uterus.  Birth control pills. These pills contain estrogen and progesterone hormone. They work by preventing the ovaries from releasing eggs (ovulation). They also cause the cervical mucus to thicken, preventing the sperm from entering the uterus. Birth control pills are prescribed by a health care provider.Birth control pills can also be used to treat heavy periods.  Minipill. This type of birth control pill contains only the progesterone hormone. They are taken every day   of each month and must be prescribed by your health care provider.  Birth control patch. The patch contains hormones similar to those in birth control pills. It must be changed once a week and is prescribed by a health care provider.  Vaginal ring. The ring contains hormones similar to those in birth control pills. It is left in the vagina for 3 weeks, removed for 1 week, and then a new one is put back in place. The patient must be comfortable inserting and removing the ring from the vagina.A health care provider's prescription is necessary.  Emergency contraception. Emergency contraceptives prevent pregnancy after unprotected sexual intercourse. This pill can be taken right after sex or up to 5 days after unprotected sex. It is most effective the sooner you take the pills after having sexual intercourse. Most emergency contraceptive pills are available without a prescription. Check with your pharmacist. Do not use emergency contraception as your only form of birth control. Barrier methods  Female condom. This is a thin sheath (latex or rubber) that is worn over the penis during sexual intercourse. It can be used with  spermicide to increase effectiveness.  Female condom. This is a soft, loose-fitting sheath that is put into the vagina before sexual intercourse.  Diaphragm. This is a soft, latex, dome-shaped barrier that must be fitted by a health care provider. It is inserted into the vagina, along with a spermicidal jelly. It is inserted before intercourse. The diaphragm should be left in the vagina for 6 to 8 hours after intercourse.  Cervical cap. This is a round, soft, latex or plastic cup that fits over the cervix and must be fitted by a health care provider. The cap can be left in place for up to 48 hours after intercourse.  Sponge. This is a soft, circular piece of polyurethane foam. The sponge has spermicide in it. It is inserted into the vagina after wetting it and before sexual intercourse.  Spermicides. These are chemicals that kill or block sperm from entering the cervix and uterus. They come in the form of creams, jellies, suppositories, foam, or tablets. They do not require a prescription. They are inserted into the vagina with an applicator before having sexual intercourse. The process must be repeated every time you have sexual intercourse. Intrauterine contraception  Intrauterine device (IUD). This is a T-shaped device that is put in a woman's uterus during a menstrual period to prevent pregnancy. There are 2 types: ? Copper IUD. This type of IUD is wrapped in copper wire and is placed inside the uterus. Copper makes the uterus and fallopian tubes produce a fluid that kills sperm. It can stay in place for 10 years. ? Hormone IUD. This type of IUD contains the hormone progestin (synthetic progesterone). The hormone thickens the cervical mucus and prevents sperm from entering the uterus, and it also thins the uterine lining to prevent implantation of a fertilized egg. The hormone can weaken or kill the sperm that get into the uterus. It can stay in place for 3-5 years, depending on which type of IUD  is used. Permanent methods of contraception  Female tubal ligation. This is when the woman's fallopian tubes are surgically sealed, tied, or blocked to prevent the egg from traveling to the uterus.  Hysteroscopic sterilization. This involves placing a small coil or insert into each fallopian tube. Your doctor uses a technique called hysteroscopy to do the procedure. The device causes scar tissue to form. This results in permanent blockage of the fallopian   tubes, so the sperm cannot fertilize the egg. It takes about 3 months after the procedure for the tubes to become blocked. You must use another form of birth control for these 3 months.  Female sterilization. This is when the female has the tubes that carry sperm tied off (vasectomy).This blocks sperm from entering the vagina during sexual intercourse. After the procedure, the man can still ejaculate fluid (semen). Natural planning methods  Natural family planning. This is not having sexual intercourse or using a barrier method (condom, diaphragm, cervical cap) on days the woman could become pregnant.  Calendar method. This is keeping track of the length of each menstrual cycle and identifying when you are fertile.  Ovulation method. This is avoiding sexual intercourse during ovulation.  Symptothermal method. This is avoiding sexual intercourse during ovulation, using a thermometer and ovulation symptoms.  Post-ovulation method. This is timing sexual intercourse after you have ovulated. Regardless of which type or method of contraception you choose, it is important that you use condoms to protect against the transmission of sexually transmitted infections (STIs). Talk with your health care provider about which form of contraception is most appropriate for you. This information is not intended to replace advice given to you by your health care provider. Make sure you discuss any questions you have with your health care provider. Document Released:  03/28/2005 Document Revised: 09/03/2015 Document Reviewed: 09/20/2012 [employer] Interactive Patient Education  2017 [employer]   Breastfeeding Deciding to breastfeed is one of the best choices you can make for you and your baby. A change in hormones during pregnancy causes your breast tissue to grow and increases the number and size of your milk ducts. These hormones also allow proteins, sugars, and fats from your blood supply to make breast milk in your milk-producing glands. Hormones prevent breast milk from being released before your baby is born as well as prompt milk flow after birth. Once breastfeeding has begun, thoughts of your baby, as well as his or her sucking or crying, can stimulate the release of milk from your milk-producing glands. Benefits of breastfeeding For Your Baby  Your first milk (colostrum) helps your baby's digestive system function better.  There are antibodies in your milk that help your baby fight off infections.  Your baby has a lower incidence of asthma, allergies, and sudden infant death syndrome.  The nutrients in breast milk are better for your baby than infant formulas and are designed uniquely for your baby's needs.  Breast milk improves your baby's brain development.  Your baby is less likely to develop other conditions, such as childhood obesity, asthma, or type 2 diabetes mellitus.  For You  Breastfeeding helps to create a very special bond between you and your baby.  Breastfeeding is convenient. Breast milk is always available at the correct temperature and costs nothing.  Breastfeeding helps to burn calories and helps you lose the weight gained during pregnancy.  Breastfeeding makes your uterus contract to its prepregnancy size faster and slows bleeding (lochia) after you give birth.  Breastfeeding helps to lower your risk of developing type 2 diabetes mellitus, osteoporosis, and breast or ovarian cancer later in life.  Signs that your baby  is hungry Early Signs of Hunger  Increased alertness or activity.  Stretching.  Movement of the head from side to side.  Movement of the head and opening of the mouth when the corner of the mouth or cheek is stroked (rooting).  Increased sucking sounds, smacking lips, cooing, sighing, or   squeaking.  Hand-to-mouth movements.  Increased sucking of fingers or hands.  Late Signs of Hunger  Fussing.  Intermittent crying.  Extreme Signs of Hunger Signs of extreme hunger will require calming and consoling before your baby will be able to breastfeed successfully. Do not wait for the following signs of extreme hunger to occur before you initiate breastfeeding:  Restlessness.  A loud, strong cry.  Screaming.  Breastfeeding basics Breastfeeding Initiation  Find a comfortable place to sit or lie down, with your neck and back well supported.  Place a pillow or rolled up blanket under your baby to bring him or her to the level of your breast (if you are seated). Nursing pillows are specially designed to help support your arms and your baby while you breastfeed.  Make sure that your baby's abdomen is facing your abdomen.  Gently massage your breast. With your fingertips, massage from your chest wall toward your nipple in a circular motion. This encourages milk flow. You may need to continue this action during the feeding if your milk flows slowly.  Support your breast with 4 fingers underneath and your thumb above your nipple. Make sure your fingers are well away from your nipple and your baby's mouth.  Stroke your baby's lips gently with your finger or nipple.  When your baby's mouth is open wide enough, quickly bring your baby to your breast, placing your entire nipple and as much of the colored area around your nipple (areola) as possible into your baby's mouth. ? More areola should be visible above your baby's upper lip than below the lower lip. ? Your baby's tongue should be  between his or her lower gum and your breast.  Ensure that your baby's mouth is correctly positioned around your nipple (latched). Your baby's lips should create a seal on your breast and be turned out (everted).  It is common for your baby to suck about 2-3 minutes in order to start the flow of breast milk.  Latching Teaching your baby how to latch on to your breast properly is very important. An improper latch can cause nipple pain and decreased milk supply for you and poor weight gain in your baby. Also, if your baby is not latched onto your nipple properly, he or she may swallow some air during feeding. This can make your baby fussy. Burping your baby when you switch breasts during the feeding can help to get rid of the air. However, teaching your baby to latch on properly is still the best way to prevent fussiness from swallowing air while breastfeeding. Signs that your baby has successfully latched on to your nipple:  Silent tugging or silent sucking, without causing you pain.  Swallowing heard between every 3-4 sucks.  Muscle movement above and in front of his or her ears while sucking.  Signs that your baby has not successfully latched on to nipple:  Sucking sounds or smacking sounds from your baby while breastfeeding.  Nipple pain.  If you think your baby has not latched on correctly, slip your finger into the corner of your baby's mouth to break the suction and place it between your baby's gums. Attempt breastfeeding initiation again. Signs of Successful Breastfeeding Signs from your baby:  A gradual decrease in the number of sucks or complete cessation of sucking.  Falling asleep.  Relaxation of his or her body.  Retention of a small amount of milk in his or her mouth.  Letting go of your breast by himself or herself.    Signs from you:  Breasts that have increased in firmness, weight, and size 1-3 hours after feeding.  Breasts that are softer immediately after  breastfeeding.  Increased milk volume, as well as a change in milk consistency and color by the fifth day of breastfeeding.  Nipples that are not sore, cracked, or bleeding.  Signs That Your Baby is Getting Enough Milk  Wetting at least 1-2 diapers during the first 24 hours after birth.  Wetting at least 5-6 diapers every 24 hours for the first week after birth. The urine should be clear or pale yellow by 5 days after birth.  Wetting 6-8 diapers every 24 hours as your baby continues to grow and develop.  At least 3 stools in a 24-hour period by age 5 days. The stool should be soft and yellow.  At least 3 stools in a 24-hour period by age 7 days. The stool should be seedy and yellow.  No loss of weight greater than 10% of birth weight during the first 3 days of age.  Average weight gain of 4-7 ounces (113-198 g) per week after age 4 days.  Consistent daily weight gain by age 5 days, without weight loss after the age of 2 weeks.  After a feeding, your baby may spit up a small amount. This is common. Breastfeeding frequency and duration Frequent feeding will help you make more milk and can prevent sore nipples and breast engorgement. Breastfeed when you feel the need to reduce the fullness of your breasts or when your baby shows signs of hunger. This is called "breastfeeding on demand." Avoid introducing a pacifier to your baby while you are working to establish breastfeeding (the first 4-6 weeks after your baby is born). After this time you may choose to use a pacifier. Research has shown that pacifier use during the first year of a baby's life decreases the risk of sudden infant death syndrome (SIDS). Allow your baby to feed on each breast as long as he or she wants. Breastfeed until your baby is finished feeding. When your baby unlatches or falls asleep while feeding from the first breast, offer the second breast. Because newborns are often sleepy in the first few weeks of life, you may  need to awaken your baby to get him or her to feed. Breastfeeding times will vary from baby to baby. However, the following rules can serve as a guide to help you ensure that your baby is properly fed:  Newborns (babies 4 weeks of age or younger) may breastfeed every 1-3 hours.  Newborns should not go longer than 3 hours during the day or 5 hours during the night without breastfeeding.  You should breastfeed your baby a minimum of 8 times in a 24-hour period until you begin to introduce solid foods to your baby at around 6 months of age.  Breast milk pumping Pumping and storing breast milk allows you to ensure that your baby is exclusively fed your breast milk, even at times when you are unable to breastfeed. This is especially important if you are going back to work while you are still breastfeeding or when you are not able to be present during feedings. Your lactation consultant can give you guidelines on how long it is safe to store breast milk. A breast pump is a machine that allows you to pump milk from your breast into a sterile bottle. The pumped breast milk can then be stored in a refrigerator or freezer. Some breast pumps are operated by   hand, while others use electricity. Ask your lactation consultant which type will work best for you. Breast pumps can be purchased, but some hospitals and breastfeeding support groups lease breast pumps on a monthly basis. A lactation consultant can teach you how to hand express breast milk, if you prefer not to use a pump. Caring for your breasts while you breastfeed Nipples can become dry, cracked, and sore while breastfeeding. The following recommendations can help keep your breasts moisturized and healthy:  Avoid using soap on your nipples.  Wear a supportive bra. Although not required, special nursing bras and tank tops are designed to allow access to your breasts for breastfeeding without taking off your entire bra or top. Avoid wearing  underwire-style bras or extremely tight bras.  Air dry your nipples for 3-4minutes after each feeding.  Use only cotton bra pads to absorb leaked breast milk. Leaking of breast milk between feedings is normal.  Use lanolin on your nipples after breastfeeding. Lanolin helps to maintain your skin's normal moisture barrier. If you use pure lanolin, you do not need to wash it off before feeding your baby again. Pure lanolin is not toxic to your baby. You may also hand express a few drops of breast milk and gently massage that milk into your nipples and allow the milk to air dry.  In the first few weeks after giving birth, some women experience extremely full breasts (engorgement). Engorgement can make your breasts feel heavy, warm, and tender to the touch. Engorgement peaks within 3-5 days after you give birth. The following recommendations can help ease engorgement:  Completely empty your breasts while breastfeeding or pumping. You may want to start by applying warm, moist heat (in the shower or with warm water-soaked hand towels) just before feeding or pumping. This increases circulation and helps the milk flow. If your baby does not completely empty your breasts while breastfeeding, pump any extra milk after he or she is finished.  Wear a snug bra (nursing or regular) or tank top for 1-2 days to signal your body to slightly decrease milk production.  Apply ice packs to your breasts, unless this is too uncomfortable for you.  Make sure that your baby is latched on and positioned properly while breastfeeding.  If engorgement persists after 48 hours of following these recommendations, contact your health care provider or a lactation consultant. Overall health care recommendations while breastfeeding  Eat healthy foods. Alternate between meals and snacks, eating 3 of each per day. Because what you eat affects your breast milk, some of the foods may make your baby more irritable than usual. Avoid  eating these foods if you are sure that they are negatively affecting your baby.  Drink milk, fruit juice, and water to satisfy your thirst (about 10 glasses a day).  Rest often, relax, and continue to take your prenatal vitamins to prevent fatigue, stress, and anemia.  Continue breast self-awareness checks.  Avoid chewing and smoking tobacco. Chemicals from cigarettes that pass into breast milk and exposure to secondhand smoke may harm your baby.  Avoid alcohol and drug use, including marijuana. Some medicines that may be harmful to your baby can pass through breast milk. It is important to ask your health care provider before taking any medicine, including all over-the-counter and prescription medicine as well as vitamin and herbal supplements. It is possible to become pregnant while breastfeeding. If birth control is desired, ask your health care provider about options that will be safe for your baby. Contact   a health care provider if:  You feel like you want to stop breastfeeding or have become frustrated with breastfeeding.  You have painful breasts or nipples.  Your nipples are cracked or bleeding.  Your breasts are red, tender, or warm.  You have a swollen area on either breast.  You have a fever or chills.  You have nausea or vomiting.  You have drainage other than breast milk from your nipples.  Your breasts do not become full before feedings by the fifth day after you give birth.  You feel sad and depressed.  Your baby is too sleepy to eat well.  Your baby is having trouble sleeping.  Your baby is wetting less than 3 diapers in a 24-hour period.  Your baby has less than 3 stools in a 24-hour period.  Your baby's skin or the white part of his or her eyes becomes yellow.  Your baby is not gaining weight by 5 days of age. Get help right away if:  Your baby is overly tired (lethargic) and does not want to wake up and feed.  Your baby develops an unexplained  fever. This information is not intended to replace advice given to you by your health care provider. Make sure you discuss any questions you have with your health care provider. Document Released: 03/28/2005 Document Revised: 09/09/2015 Document Reviewed: 09/19/2012 Elsevier Interactive Patient Education  2017 [employer]vier Inc.  

## 2016-11-14 NOTE — Addendum Note (Signed)
Addended by: Dalphine HandingGARDNER, Eryca Bolte L on: 11/14/2016 03:16 PM   Modules accepted: Orders

## 2016-11-15 DIAGNOSIS — Z3483 Encounter for supervision of other normal pregnancy, third trimester: Secondary | ICD-10-CM

## 2016-11-15 LAB — HEMOGLOBIN A1C
ESTIMATED AVERAGE GLUCOSE: 103 mg/dL
Hgb A1c MFr Bld: 5.2 % (ref 4.8–5.6)

## 2016-11-18 LAB — AFP TETRA
DIA MOM VALUE: 0.98
DIA VALUE (EIA): 148.39 pg/mL
DSR (By Age)    1 IN: 1081
DSR (SECOND TRIMESTER) 1 IN: 8418
GESTATIONAL AGE AFP: 15.2 wk
MATERNAL AGE AT EDD: 23.7 a
MSAFP MOM: 0.57
MSAFP: 14.9 ng/mL
MSHCG MOM: 0.65
MSHCG: 26502 m[IU]/mL
OSB RISK: 10000
T18 (By Age): 1:4212 {titer}
Test Results:: NEGATIVE
UE3 MOM: 1.04
WEIGHT: 202 [lb_av]
uE3 Value: 0.57 ng/mL

## 2016-11-18 LAB — OBSTETRIC PANEL, INCLUDING HIV
Antibody Screen: NEGATIVE
BASOS ABS: 0 10*3/uL (ref 0.0–0.2)
Basos: 0 %
EOS (ABSOLUTE): 0.2 10*3/uL (ref 0.0–0.4)
EOS: 2 %
HEMOGLOBIN: 11.9 g/dL (ref 11.1–15.9)
HEP B S AG: NEGATIVE
HIV Screen 4th Generation wRfx: NONREACTIVE
Hematocrit: 34.7 % (ref 34.0–46.6)
IMMATURE GRANS (ABS): 0 10*3/uL (ref 0.0–0.1)
IMMATURE GRANULOCYTES: 0 %
LYMPHS: 25 %
Lymphocytes Absolute: 2.3 10*3/uL (ref 0.7–3.1)
MCH: 27.6 pg (ref 26.6–33.0)
MCHC: 34.3 g/dL (ref 31.5–35.7)
MCV: 81 fL (ref 79–97)
MONOS ABS: 0.5 10*3/uL (ref 0.1–0.9)
Monocytes: 5 %
NEUTROS PCT: 68 %
Neutrophils Absolute: 6.2 10*3/uL (ref 1.4–7.0)
PLATELETS: 195 10*3/uL (ref 150–379)
RBC: 4.31 x10E6/uL (ref 3.77–5.28)
RDW: 14 % (ref 12.3–15.4)
RH TYPE: POSITIVE
RPR: NONREACTIVE
Rubella Antibodies, IGG: 0.97 index — ABNORMAL LOW (ref >0.99)
WBC: 9.1 10*3/uL (ref 3.4–10.8)

## 2016-11-18 LAB — HEMOGLOBINOPATHY EVALUATION
HEMOGLOBIN A2 QUANTITATION: 1.9 % (ref 1.8–3.2)
HGB C: 0 %
HGB S: 0 %
HGB VARIANT: 0 %
Hemoglobin F Quantitation: 0 % (ref 0.0–2.0)
Hgb A: 98.1 % (ref 96.4–98.8)

## 2016-11-21 ENCOUNTER — Encounter: Payer: Self-pay | Admitting: Obstetrics and Gynecology

## 2016-11-21 DIAGNOSIS — O9989 Other specified diseases and conditions complicating pregnancy, childbirth and the puerperium: Secondary | ICD-10-CM

## 2016-11-21 DIAGNOSIS — Z283 Underimmunization status: Secondary | ICD-10-CM | POA: Insufficient documentation

## 2016-11-21 DIAGNOSIS — O09899 Supervision of other high risk pregnancies, unspecified trimester: Secondary | ICD-10-CM | POA: Insufficient documentation

## 2016-11-21 LAB — SMN1 COPY NUMBER ANALYSIS (SMA CARRIER SCREENING)

## 2016-11-22 LAB — CYSTIC FIBROSIS MUTATION 97: GENE DIS ANAL CARRIER INTERP BLD/T-IMP: NOT DETECTED

## 2016-12-08 ENCOUNTER — Telehealth: Payer: Self-pay

## 2016-12-08 NOTE — Telephone Encounter (Signed)
S/w patient and she stated that additional info on FMLA forms needed to be completed, patient provided phone # to company, contacted and provided info over the phone and representative stated that they would inform the patient.

## 2016-12-13 ENCOUNTER — Ambulatory Visit (INDEPENDENT_AMBULATORY_CARE_PROVIDER_SITE_OTHER): Payer: Medicaid Other | Admitting: Obstetrics & Gynecology

## 2016-12-13 ENCOUNTER — Encounter: Payer: Self-pay | Admitting: Obstetrics & Gynecology

## 2016-12-13 ENCOUNTER — Encounter: Payer: Medicaid Other | Admitting: Obstetrics & Gynecology

## 2016-12-13 ENCOUNTER — Ambulatory Visit (HOSPITAL_COMMUNITY)
Admission: RE | Admit: 2016-12-13 | Discharge: 2016-12-13 | Disposition: A | Discharge status: Home / Self Care | Payer: Medicaid Other | Source: Ambulatory Visit | Attending: Obstetrics and Gynecology | Admitting: Obstetrics and Gynecology

## 2016-12-13 ENCOUNTER — Other Ambulatory Visit: Payer: Self-pay | Admitting: Obstetrics and Gynecology

## 2016-12-13 VITALS — BP 122/72 | HR 82 | Wt 204.0 lb

## 2016-12-13 DIAGNOSIS — O99212 Obesity complicating pregnancy, second trimester: Secondary | ICD-10-CM

## 2016-12-13 DIAGNOSIS — Z3689 Encounter for other specified antenatal screening: Secondary | ICD-10-CM

## 2016-12-13 DIAGNOSIS — Z3480 Encounter for supervision of other normal pregnancy, unspecified trimester: Secondary | ICD-10-CM

## 2016-12-13 DIAGNOSIS — Z3A19 19 weeks gestation of pregnancy: Secondary | ICD-10-CM

## 2016-12-13 NOTE — Patient Instructions (Signed)

## 2016-12-13 NOTE — Progress Notes (Signed)
   PRENATAL VISIT NOTE  Subjective:  Wendy Gutierrez is a 21 y.o. G2P1001 at 6946w1d being seen today for ongoing prenatal care.  She is currently monitored for the following issues for this low-risk pregnancy and has Migraine without aura and without status migrainosus, not intractable; Encounter for supervision of other normal pregnancy, unspecified trimester; LGSIL on Pap smear of cervix; Maternal obesity, antepartum; and Rubella non-immune status, antepartum on her problem list.  Patient reports no complaints.  Contractions: Not present. Vag. Bleeding: None.  Movement: Present. Denies leaking of fluid.   The following portions of the patient's history were reviewed and updated as appropriate: allergies, current medications, past family history, past medical history, past social history, past surgical history and problem list. Problem list updated.  Objective:   Vitals:   12/13/16 1124  BP: 122/72  Pulse: 82  Weight: 204 lb (92.5 kg)    Fetal Status: Fetal Heart Rate (bpm): 150   Movement: Present     General:  Alert, oriented and cooperative. Patient is in no acute distress.  Skin: Skin is warm and dry. No rash noted.   Cardiovascular: Normal heart rate noted  Respiratory: Normal respiratory effort, no problems with respiration noted  Abdomen: Soft, gravid, appropriate for gestational age.  Pain/Pressure: Present     Pelvic: Cervical exam deferred        Extremities: Normal range of motion.  Edema: None  Mental Status:  Normal mood and affect. Normal behavior. Normal judgment and thought content.   Assessment and Plan:anatomy scan is today  Pregnancy: G2P1001 at 5046w1d  1. Encounter for supervision of other normal pregnancy, unspecified trimester [facility]  Preterm labor symptoms and general obstetric precautions including but not limited to vaginal bleeding, contractions, leaking of fluid and fetal movement were reviewed in detail with the patient. Please refer  to After Visit Summary for other counseling recommendations.  Return in about 4 weeks (around 01/10/2017).   Scheryl DarterJames Reason Helzer, MD

## 2016-12-19 ENCOUNTER — Telehealth: Payer: Self-pay | Admitting: *Deleted

## 2016-12-19 ENCOUNTER — Encounter: Payer: Self-pay | Admitting: *Deleted

## 2016-12-19 NOTE — Telephone Encounter (Signed)
Spoke with pt regarding questions about u/s and back pain.  Pt states that her pregnancy support band is not really helping with her back pain.  Pt states that she is on her feet walking all day at work and is still having increase back pain. Pt advised that she may check to make sure she is wearing support belt properly.  Pt advised that she may take 2 extra strength Tylenol as needed, drink plenty of fluids and discuss with employer regarding rest breaks as needed or if she needs accomodation paperwork. Pt states she has been able to sit as needed at work but is still having back pain. Pt advised that if previous suggestions do not help, to discuss at next appt in order for any other recommendations.  Pt also had questions regarding u/s. Pt was confused by results.  Pt made aware that some of the fetal anatomy was difficult to visualize due to her size/body shape and way baby was positioned. Pt made aware that a repeat u/s should be ordered at her next OB visit in order to complete anatomy.  Pt states understanding.

## 2016-12-26 ENCOUNTER — Telehealth: Payer: Self-pay | Admitting: *Deleted

## 2016-12-26 NOTE — Telephone Encounter (Signed)
Patient is calling to alert the provider that she is having spells of blurry vision 2 times/month. 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 The blurred vision lasts about 1-2 hours and goes away-usually sitting and drinking water helps. She can not relate the symptoms to anything particular and wants to know if their is any advisement. ( It happened an hour after she got to work this week and she had eaten breakfast.) Told patient I would check with her provider and let her know.

## 2017-01-09 ENCOUNTER — Telehealth: Payer: Self-pay

## 2017-01-09 NOTE — Telephone Encounter (Signed)
Returned call and pt had questions about appt tomorrow.

## 2017-01-10 ENCOUNTER — Ambulatory Visit (INDEPENDENT_AMBULATORY_CARE_PROVIDER_SITE_OTHER): Payer: Medicaid Other | Admitting: Obstetrics & Gynecology

## 2017-01-10 DIAGNOSIS — Z3480 Encounter for supervision of other normal pregnancy, unspecified trimester: Secondary | ICD-10-CM

## 2017-01-10 DIAGNOSIS — Z3482 Encounter for supervision of other normal pregnancy, second trimester: Secondary | ICD-10-CM

## 2017-01-10 NOTE — Progress Notes (Signed)
   PRENATAL VISIT NOTE  Subjective:  Zakeria Kulzer is a 21 y.o. G2P1001 at [redacted]w[redacted]d being seen today for ongoing prenatal care.  She is currently monitored for the following issues for this low-risk pregnancy and has Migraine without aura and without status migrainosus, not intractable; Encounter for supervision of other normal pregnancy, unspecified trimester; LGSIL on Pap smear of cervix; Maternal obesity, antepartum; and Rubella non-immune status, antepartum on her problem list.  Patient reports low abdominal pressure.  Contractions: Not present. Vag. Bleeding: None.  Movement: Present. Denies leaking of fluid.   The following portions of the patient's history were reviewed and updated as appropriate: allergies, current medications, past family history, past medical history, past social history, past surgical history and problem list. Problem list updated.  Objective:   Vitals:   01/10/17 0942  BP: 123/74  Pulse: 96  Weight: 217 lb (98.4 kg)    Fetal Status: Fetal Heart Rate (bpm): 145   Movement: Present     General:  Alert, oriented and cooperative. Patient is in no acute distress.  Skin: Skin is warm and dry. No rash noted.   Cardiovascular: Normal heart rate noted  Respiratory: Normal respiratory effort, no problems with respiration noted  Abdomen: Soft, gravid, appropriate for gestational age.  Pain/Pressure: Present     Pelvic: Cervical exam deferred        Extremities: Normal range of motion.  Edema: None  Mental Status:  Normal mood and affect. Normal behavior. Normal judgment and thought content.   Assessment and Plan:  Pregnancy: G2P1001 at [redacted]w[redacted]d  1. Encounter for supervision of other normal pregnancy, unspecified trimester Needs to complete anatomy - US MFM OB FOLLOW UP; Future  Preterm labor symptoms and general obstetric precautions including but not limited to vaginal bleeding, contractions, leaking of fluid and fetal movement were reviewed in detail with the  patient. Please refer to After Visit Summary for other counseling recommendations.  Return in about 4 weeks (around 02/07/2017).   Scheryl Darter, MD

## 2017-01-10 NOTE — Patient Instructions (Signed)

## 2017-01-24 ENCOUNTER — Other Ambulatory Visit (HOSPITAL_COMMUNITY): Payer: Self-pay | Admitting: *Deleted

## 2017-01-24 ENCOUNTER — Other Ambulatory Visit: Payer: Self-pay | Admitting: Obstetrics & Gynecology

## 2017-01-24 ENCOUNTER — Ambulatory Visit (HOSPITAL_COMMUNITY)
Admission: RE | Admit: 2017-01-24 | Discharge: 2017-01-24 | Disposition: A | Discharge status: Home / Self Care | Payer: Medicaid Other | Source: Ambulatory Visit | Attending: Obstetrics & Gynecology | Admitting: Obstetrics & Gynecology

## 2017-01-24 DIAGNOSIS — Z3482 Encounter for supervision of other normal pregnancy, second trimester: Secondary | ICD-10-CM | POA: Insufficient documentation

## 2017-01-24 DIAGNOSIS — Z3A25 25 weeks gestation of pregnancy: Secondary | ICD-10-CM | POA: Diagnosis not present

## 2017-01-24 DIAGNOSIS — Z3480 Encounter for supervision of other normal pregnancy, unspecified trimester: Secondary | ICD-10-CM | POA: Diagnosis present

## 2017-01-24 DIAGNOSIS — O99212 Obesity complicating pregnancy, second trimester: Secondary | ICD-10-CM | POA: Diagnosis not present

## 2017-01-24 DIAGNOSIS — Z362 Encounter for other antenatal screening follow-up: Secondary | ICD-10-CM | POA: Diagnosis not present

## 2017-01-24 DIAGNOSIS — Z3683 Encounter for fetal screening for congenital cardiac abnormalities: Secondary | ICD-10-CM

## 2017-02-07 ENCOUNTER — Encounter (HOSPITAL_COMMUNITY): Payer: Self-pay

## 2017-02-07 ENCOUNTER — Ambulatory Visit (INDEPENDENT_AMBULATORY_CARE_PROVIDER_SITE_OTHER): Payer: Medicaid Other | Admitting: Obstetrics and Gynecology

## 2017-02-07 DIAGNOSIS — O99891 Other specified diseases and conditions complicating pregnancy: Secondary | ICD-10-CM

## 2017-02-07 DIAGNOSIS — O9989 Other specified diseases and conditions complicating pregnancy, childbirth and the puerperium: Secondary | ICD-10-CM

## 2017-02-07 DIAGNOSIS — Z3480 Encounter for supervision of other normal pregnancy, unspecified trimester: Secondary | ICD-10-CM

## 2017-02-07 DIAGNOSIS — M549 Dorsalgia, unspecified: Secondary | ICD-10-CM | POA: Insufficient documentation

## 2017-02-07 DIAGNOSIS — O358XX0 Maternal care for other (suspected) fetal abnormality and damage, not applicable or unspecified: Secondary | ICD-10-CM

## 2017-02-07 DIAGNOSIS — O35BXX0 Maternal care for other (suspected) fetal abnormality and damage, fetal cardiac anomalies, not applicable or unspecified: Secondary | ICD-10-CM | POA: Insufficient documentation

## 2017-02-07 MED ORDER — CYCLOBENZAPRINE HCL 10 MG PO TABS: 10.0000 mg | ORAL_TABLET | Freq: Three times a day (TID) | ORAL | 1 refills | Status: DC | PRN

## 2017-02-07 NOTE — Progress Notes (Signed)
Pt states increase in back and lower pelvic pain. Pt states support belt does not help. Would like to discuss work conditions as well.

## 2017-02-07 NOTE — Progress Notes (Signed)
Subjective:  Wendy Gutierrez is a 21 y.o. G2P1001 at 2513w1d being seen today for ongoing prenatal care.  She is currently monitored for the following issues for this high-risk pregnancy and has Migraine without aura and without status migrainosus, not intractable; Encounter for supervision of other normal pregnancy, unspecified trimester; LGSIL on Pap smear of cervix; Maternal obesity, antepartum; Rubella non-immune status, antepartum; Fetal cardiac anomaly affecting pregnancy, antepartum; and Back pain affecting pregnancy on her problem list.  Patient reports general discomforts of pregnancy.  Contractions: Not present. Vag. Bleeding: None.  Movement: Present. Denies leaking of fluid.   The following portions of the patient's history were reviewed and updated as appropriate: allergies, current medications, past family history, past medical history, past social history, past surgical history and problem list. Problem list updated.  Objective:   Vitals:   02/07/17 1002  BP: 130/79  Pulse: (!) 103  Weight: 221 lb (100.2 kg)    Fetal Status: Fetal Heart Rate (bpm): 150   Movement: Present     General:  Alert, oriented and cooperative. Patient is in no acute distress.  Skin: Skin is warm and dry. No rash noted.   Cardiovascular: Normal heart rate noted  Respiratory: Normal respiratory effort, no problems with respiration noted  Abdomen: Soft, gravid, appropriate for gestational age. Pain/Pressure: Present     Pelvic:  Cervical exam deferred        Extremities: Normal range of motion.     Mental Status: Normal mood and affect. Normal behavior. Normal judgment and thought content.   Urinalysis:      Assessment and Plan:  Pregnancy: G2P1001 at 4813w1d  1. Encounter for supervision of other normal pregnancy, unspecified trimester Stable Declined flu vaccine Glucola ordered  2. Anomaly of heart of fetus affecting pregnancy, antepartum, single or unspecified fetus  - US Fetal  Echocardiography; Future  3. Back pain affecting pregnancy in second trimester  - cyclobenzaprine (FLEXERIL) 10 MG tablet; Take 1 tablet (10 mg total) by mouth every 8 (eight) hours as needed for muscle spasms.  Dispense: 30 tablet; Refill: 1  Preterm labor symptoms and general obstetric precautions including but not limited to vaginal bleeding, contractions, leaking of fluid and fetal movement were reviewed in detail with the patient. Please refer to After Visit Summary for other counseling recommendations.  Return in about 3 weeks (around 02/28/2017) for OB visit.   Hermina StaggersErvin, Louann Hopson L, MD

## 2017-02-07 NOTE — Addendum Note (Signed)
Addended by: Hermina StaggersERVIN, Jaydeen Darley L on: 02/07/2017 12:31 PM   Modules accepted: Orders

## 2017-02-14 ENCOUNTER — Inpatient Hospital Stay (HOSPITAL_COMMUNITY): Payer: Medicaid Other

## 2017-02-14 ENCOUNTER — Inpatient Hospital Stay (HOSPITAL_COMMUNITY)
Admission: AD | Admit: 2017-02-14 | Discharge: 2017-02-14 | Disposition: A | Discharge status: Home / Self Care | Payer: Medicaid Other | Source: Ambulatory Visit | Attending: Obstetrics & Gynecology | Admitting: Obstetrics & Gynecology

## 2017-02-14 ENCOUNTER — Encounter (HOSPITAL_COMMUNITY): Payer: Self-pay | Admitting: *Deleted

## 2017-02-14 ENCOUNTER — Other Ambulatory Visit: Payer: Medicaid Other

## 2017-02-14 DIAGNOSIS — Z3A28 28 weeks gestation of pregnancy: Secondary | ICD-10-CM

## 2017-02-14 DIAGNOSIS — O9989 Other specified diseases and conditions complicating pregnancy, childbirth and the puerperium: Secondary | ICD-10-CM | POA: Diagnosis not present

## 2017-02-14 DIAGNOSIS — O4693 Antepartum hemorrhage, unspecified, third trimester: Secondary | ICD-10-CM | POA: Diagnosis not present

## 2017-02-14 DIAGNOSIS — O99283 Endocrine, nutritional and metabolic diseases complicating pregnancy, third trimester: Secondary | ICD-10-CM | POA: Insufficient documentation

## 2017-02-14 DIAGNOSIS — E559 Vitamin D deficiency, unspecified: Secondary | ICD-10-CM | POA: Insufficient documentation

## 2017-02-14 DIAGNOSIS — Z803 Family history of malignant neoplasm of breast: Secondary | ICD-10-CM | POA: Diagnosis not present

## 2017-02-14 DIAGNOSIS — O99210 Obesity complicating pregnancy, unspecified trimester: Secondary | ICD-10-CM

## 2017-02-14 DIAGNOSIS — Z79899 Other long term (current) drug therapy: Secondary | ICD-10-CM | POA: Diagnosis not present

## 2017-02-14 DIAGNOSIS — Z2839 Other underimmunization status: Secondary | ICD-10-CM

## 2017-02-14 DIAGNOSIS — Z283 Underimmunization status: Secondary | ICD-10-CM

## 2017-02-14 DIAGNOSIS — E669 Obesity, unspecified: Secondary | ICD-10-CM | POA: Diagnosis not present

## 2017-02-14 DIAGNOSIS — O99213 Obesity complicating pregnancy, third trimester: Secondary | ICD-10-CM | POA: Insufficient documentation

## 2017-02-14 DIAGNOSIS — R109 Unspecified abdominal pain: Secondary | ICD-10-CM

## 2017-02-14 DIAGNOSIS — N930 Postcoital and contact bleeding: Secondary | ICD-10-CM | POA: Diagnosis not present

## 2017-02-14 DIAGNOSIS — N939 Abnormal uterine and vaginal bleeding, unspecified: Secondary | ICD-10-CM

## 2017-02-14 DIAGNOSIS — O26893 Other specified pregnancy related conditions, third trimester: Secondary | ICD-10-CM | POA: Diagnosis present

## 2017-02-14 DIAGNOSIS — O09899 Supervision of other high risk pregnancies, unspecified trimester: Secondary | ICD-10-CM

## 2017-02-14 DIAGNOSIS — Z3480 Encounter for supervision of other normal pregnancy, unspecified trimester: Secondary | ICD-10-CM

## 2017-02-14 LAB — CBC
HCT: 32.4 % — ABNORMAL LOW (ref 36.0–46.0)
Hemoglobin: 10.7 g/dL — ABNORMAL LOW (ref 12.0–15.0)
MCH: 27.2 pg (ref 26.0–34.0)
MCHC: 33 g/dL (ref 30.0–36.0)
MCV: 82.2 fL (ref 78.0–100.0)
Platelets: 175 10*3/uL (ref 150–400)
RBC: 3.94 MIL/uL (ref 3.87–5.11)
RDW: 14.1 % (ref 11.5–15.5)
WBC: 8.3 10*3/uL (ref 4.0–10.5)

## 2017-02-14 LAB — URINALYSIS, ROUTINE W REFLEX MICROSCOPIC

## 2017-02-14 LAB — URINALYSIS, MICROSCOPIC (REFLEX)

## 2017-02-14 NOTE — Discharge Instructions (Signed)
Vaginal Bleeding During Pregnancy, Third Trimester °A small amount of bleeding (spotting) from the vagina is common in pregnancy. Sometimes the bleeding is normal and is not a problem, and sometimes it is a sign of something serious. Be sure to tell your doctor about any bleeding from your vagina right away. °Follow these instructions at home: °· Watch your condition for any changes. °· Follow your doctor's instructions about how active you can be. °· If you are on bed rest: °? You may need to stay in bed and only get up to use the bathroom. °? You may be allowed to do some activities. °? If you need help, make plans for someone to help you. °· Write down: °? The number of pads you use each day. °? How often you change pads. °? How soaked (saturated) your pads are. °· Do not use tampons. °· Do not douche. °· Do not have sex or orgasms until your doctor says it is okay. °· Follow your doctor's advice about lifting, driving, and doing physical activities. °· If you pass any tissue from your vagina, save the tissue so you can show it to your doctor. °· Only take medicines as told by your doctor. °· Do not take aspirin because it can make you bleed. °· Keep all follow-up visits as told by your doctor. °Contact a doctor if: °· You bleed from your vagina. °· You have cramps. °· You have labor pains. °· You have a fever that does not go away after you take medicine. °Get help right away if: °· You have very bad cramps in your back or belly (abdomen). °· You have chills. °· You have a gush of fluid from your vagina. °· You pass large clots or tissue from your vagina. °· You bleed more. °· You feel light-headed or weak. °· You pass out (faint). °· You do not feel your baby move around as much as before. °This information is not intended to replace advice given to you by your health care provider. Make sure you discuss any questions you have with your health care provider. °Document Released: 08/12/2013 Document Revised:  09/03/2015 Document Reviewed: 12/03/2012 °Elsevier Interactive Patient Education © 2018 Elsevier Inc. ° °

## 2017-02-14 NOTE — MAU Note (Signed)
Pt presents with c/o VB that began last s/p coitus.  Reports bleeding is bright red, tapered into a pinkish color, now bright red again.  Pt states bleeding has decreased since last night.  Denies LOF.  Reports +FM.

## 2017-02-14 NOTE — Progress Notes (Signed)
EFM removed, pt to U/S.

## 2017-02-14 NOTE — MAU Note (Signed)
Pt reports bleeding since last pm, states it has slowed some now but during the night she was changing a pad q 2 hours. Reports off/on sharp lower abd pain.

## 2017-02-14 NOTE — Progress Notes (Signed)
FHR tracing sketchy from 1050 to 1104, pt in stirups for pelvic exam.  RN searching for FHR.

## 2017-02-14 NOTE — MAU Provider Note (Signed)
Chief Complaint:  Abdominal Pain and Vaginal Bleeding   None     HPI: Wendy Gutierrez is a 21 y.o. G2P1001 at [redacted]w[redacted]d who presents to MAU reporting bleeding that started last night.  Patient states that she had intercourse that night ans started bleeding right after.  Patient states that it was bright red.  At one point she was saturating a pad every 2 hours.  Bleeding slowed a little bit but still continues to have bright red bleeding.  Denies any pain. Had some initial abdominal pain after the incident which resolved. Positive fetal movement.  No loss of fluid.  She did not have any preterm labor with her prior pregnancy.  No pregnancy complications this pregnancy.  Denies contractions, leakage of fluid, vaginal discharge. Good fetal movement. No dysuria. No fevers.   Pregnancy Course:  Prenatal care at [facility]  Past Medical History: Past Medical History:  Diagnosis Date  . Headache   . Vitamin D deficiency     Past obstetric history: OB History  Gravida Para Term Preterm AB Living  2 1 1     1   SAB TAB Ectopic Multiple Live Births          1    # Outcome Date GA Lbr Len/2nd Weight Sex Delivery Anes PTL Lv  2 Current           1 Term 12/05/11 [redacted]w[redacted]d 14:00 / 00:28 3.165 kg (6 lb 15.6 oz) F Vag-Spont EPI  LIV      Past Surgical History: Past Surgical History:  Procedure Laterality Date  . NO PAST SURGERIES       Family History: Family History  Problem Relation Age of Onset  . Healthy Mother   . Healthy Father   . Breast cancer Maternal Grandmother     Social History: Social History   Tobacco Use  . Smoking status: Never Smoker  . Smokeless tobacco: Never Used  Substance Use Topics  . Alcohol use: No  . Drug use: No    Allergies: No Known Allergies  Meds:  Medications Prior to Admission  Medication Sig Dispense Refill Last Dose  . cyclobenzaprine (FLEXERIL) 10 MG tablet Take 1 tablet (10 mg total) by mouth every 8 (eight) hours as needed for muscle spasms. 30  tablet 1   . Elastic Bandages & Supports (COMFORT FIT MATERNITY SUPP MED) MISC Wear daily when ambulating (Patient not taking: Reported on 01/10/2017) 1 each 0 Not Taking  . Prenatal Vit-Fe Fumarate-FA (PRENATAL MULTIVITAMIN) TABS tablet Take 1 tablet by mouth daily at 12 noon.   Taking    I have reviewed patient's Past Medical Hx, Surgical Hx, Family Hx, Social Hx, medications and allergies.   ROS:  All systems reviewed and are negative for acute change except as noted in the HPI.   Physical Exam   Patient Vitals for the past 24 hrs:  BP Temp Temp src Pulse Resp SpO2 Height Weight  02/14/17 1012 (!) 119/58 98.3 F (36.8 C) Oral 78 18 99 % 5\' 6"  (1.676 m) 101.2 kg (223 lb)   Constitutional: Well-developed, well-nourished female in no acute distress.  Cardiovascular: normal rate and rhythm, pulses intact Respiratory: normal rate and effort.  GI: Abd soft, non-tender, gravid appropriate for gestational age.  MS: Extremities nontender, no edema, normal ROM Neurologic: Alert and oriented x 4.  Pelvic: NEFG, bright red blood in vagina, cervix partly seen and no lacerations appreciated, blood at os. No CMT Psych: normal mood and affect  Labs: Results for orders placed or performed during the hospital encounter of 02/14/17 (from the past 24 hour(s))  Urinalysis, Routine w reflex microscopic     Status: Abnormal   Collection Time: 02/14/17 10:09 AM  Result Value Ref Range   Color, Urine RED (A) YELLOW   APPearance TURBID (A) CLEAR   Specific Gravity, Urine  1.005 - 1.030    TEST NOT REPORTED DUE TO COLOR INTERFERENCE OF URINE PIGMENT   pH  5.0 - 8.0    TEST NOT REPORTED DUE TO COLOR INTERFERENCE OF URINE PIGMENT   Glucose, UA (A) NEGATIVE mg/dL    TEST NOT REPORTED DUE TO COLOR INTERFERENCE OF URINE PIGMENT   Hgb urine dipstick (A) NEGATIVE    TEST NOT REPORTED DUE TO COLOR INTERFERENCE OF URINE PIGMENT   Bilirubin Urine (A) NEGATIVE    TEST NOT REPORTED DUE TO COLOR INTERFERENCE  OF URINE PIGMENT   Ketones, ur (A) NEGATIVE mg/dL    TEST NOT REPORTED DUE TO COLOR INTERFERENCE OF URINE PIGMENT   Protein, ur (A) NEGATIVE mg/dL    TEST NOT REPORTED DUE TO COLOR INTERFERENCE OF URINE PIGMENT   Nitrite (A) NEGATIVE    TEST NOT REPORTED DUE TO COLOR INTERFERENCE OF URINE PIGMENT   Leukocytes, UA (A) NEGATIVE    TEST NOT REPORTED DUE TO COLOR INTERFERENCE OF URINE PIGMENT  Urinalysis, Microscopic (reflex)     Status: Abnormal   Collection Time: 02/14/17 10:09 AM  Result Value Ref Range   RBC / HPF TOO NUMEROUS TO COUNT 0 - 5 RBC/hpf   WBC, UA 0-5 0 - 5 WBC/hpf   Bacteria, UA RARE (A) NONE SEEN   Squamous Epithelial / LPF 6-30 (A) NONE SEEN  CBC     Status: Abnormal   Collection Time: 02/14/17 11:53 AM  Result Value Ref Range   WBC 8.3 4.0 - 10.5 K/uL   RBC 3.94 3.87 - 5.11 MIL/uL   Hemoglobin 10.7 (L) 12.0 - 15.0 g/dL   HCT 16.132.4 (L) 09.636.0 - 04.546.0 %   MCV 82.2 78.0 - 100.0 fL   MCH 27.2 26.0 - 34.0 pg   MCHC 33.0 30.0 - 36.0 g/dL   RDW 40.914.1 81.111.5 - 91.415.5 %   Platelets 175 150 - 400 K/uL    Imaging:  [place] [facility] Ob Follow Up  Result Date: 01/24/2017 ----------------------------------------------------------------------  OBSTETRICS REPORT                      (Signed Final 01/24/2017 09:52 am) ---------------------------------------------------------------------- Patient Info  ID #:       [id number]                          D.O.B.:  [birth year] (21 yrs)  Name:       Wendy Gutierrez                  Visit Date: 01/24/2017 08:43 am ---------------------------------------------------------------------- Performed By  Performed By:     Lenise ArenaHannah Bazemore        Ref. Address:     Faculty                    RDMS  Attending:        Ledon SnareBrian Brost MD         [facility]  Referred By:      Gigi GinPEGGY  CONSTANT MD ---------------------------------------------------------------------- Orders   #  Description                                 Code   1  [place] [facility] OB  FOLLOW UP                         [id number]  ----------------------------------------------------------------------   #  Ordered By               Order #        Accession #    Episode #   1  Scheryl Darter             [id number]      [id number]     [id number]  ---------------------------------------------------------------------- Indications   [redacted] weeks gestation of pregnancy                Z3A.25   Obesity complicating pregnancy, second         O99.212   trimester   Encounter for other antenatal screening        Z36.2   follow-up  ---------------------------------------------------------------------- OB History  Blood Type:            Height:  5'5"   Weight (lb):  204       BMI:  33.94  Gravidity:    2         Term:   1  Living:       1 ---------------------------------------------------------------------- Fetal Evaluation  Num Of Fetuses:     1  Fetal Heart         150  Rate(bpm):  Cardiac Activity:   Observed  Presentation:       Cephalic  Placenta:           Anterior, above cervical os  P. Cord Insertion:  Previously Visualized  Amniotic Fluid  AFI FV:      Subjectively within normal limits                              Largest Pocket(cm)                              5.52 ---------------------------------------------------------------------- Biometry  BPD:      62.8  mm     G. Age:  25w 3d         53  %    CI:        70.63   %    70 - 86                                                          FL/HC:      19.2   %    18.7 - 20.3  HC:      238.2  mm     G. Age:  25w 6d         55  %    HC/AC:      1.12        1.04 - 1.22  AC:      213.5  mm  G. Age:  25w 6d         63  %    FL/BPD:     72.9   %    71 - 87  FL:       45.8  mm     G. Age:  25w 2d         38  %    FL/AC:      21.5   %    20 - 24  HUM:      44.2  mm     G. Age:  26w 2d         72  %  Est. FW:     829  gm    1 lb 13 oz      62  % ---------------------------------------------------------------------- Gestational Age  LMP:           25w 1d        Date:   08/01/16                 EDD:   05/08/17  U/S Today:     25w 4d                                        EDD:   05/05/17  Best:          25w 1d     Det. By:  LMP  (08/01/16)          EDD:   05/08/17 ---------------------------------------------------------------------- Anatomy  Cranium:               Appears normal         Aortic Arch:            Appears normal  Cavum:                 Appears normal         Ductal Arch:            Previously seen  Ventricles:            Appears normal         Diaphragm:              Appears normal  Choroid Plexus:        Appears normal         Stomach:                Appears normal, left                                                                        sided  Cerebellum:            Appears normal         Abdomen:                Appears normal  Posterior Fossa:       Appears normal         Abdominal Wall:         Previously seen  Nuchal Fold:           Appears normal  Cord Vessels:           Previously seen  Face:                  Appears normal         Kidneys:                Appear normal                         (orbits and profile)  Lips:                  Appears normal         Bladder:                Appears normal  Thoracic:              Appears normal         Spine:                  Appears normal  Heart:                 Abnormal, see          Upper Extremities:      Previously seen                         comments  RVOT:                  Previously seen        Lower Extremities:      Previously seen  LVOT:                  Abnormal, see                         comments  Other:  Fetus appears to be a female. Heels prev. visualized.  Technically          difficult due to maternal habitus and fetal position. Nasal bone          visualized. ---------------------------------------------------------------------- Cervix Uterus Adnexa  Cervix  Length:            3.1  cm.  Persistant LUS Contraction  Uterus  No abnormality visualized.  Left Ovary  Within normal limits.  Right  Ovary  Within normal limits.  Cul De Sac:   No free fluid seen. ---------------------------------------------------------------------- Impression  [place] intrauterine pregnancy at 25 weeks 1 day gestation  with fetal cardiac activity  Cephalic presentation  Anterior placenta without evidence of previa  Normal appearing fetal growth and amniotic fluid volume  Limited fetal anatomic survey secondary to maternal habitus  and fetal position  Normal appearing cervical length ---------------------------------------------------------------------- Recommendations  Referral to pediatric cardiology for fetal echocardiography  Recommend follow-up ultrasound examination in  4 weeks ----------------------------------------------------------------------                   Ledon SnareBrian Brost, MD Electronically Signed Final Report   01/24/2017 09:52 am ----------------------------------------------------------------------   MAU Course: Vitals and nursing notes reviewed I have ordered labs/imaging and reviewed them UA unable to be tested due to significant blood US normal without placental or fetal complications   I personally reviewed the patient's NST today, found to be REACTIVE. 145 bpm, mod var, +accels, no decels. CTX: None   MDM: Plan of care reviewed with patient, including labs and tests ordered and  medical treatment.   Assessment: 1. Postcoital bleeding   2. Rubella non-immune status, antepartum   3. Encounter for supervision of other normal pregnancy, unspecified trimester   4. Maternal obesity, antepartum   5. Vaginal bleeding   6. Abdominal pain   7. [redacted] weeks gestation of pregnancy   8. Vaginal bleeding in pregnancy, third trimester     Plan: Discharge home in stable condition.  Preterm labor precautions and fetal kick counts reviewed Return precautions provided Handout given Follow-up with OB provider   Caryl Ada, DO OB Fellow Center for [facility], [facility] 02/14/2017  10:20 AM

## 2017-02-20 ENCOUNTER — Telehealth: Payer: Self-pay

## 2017-02-20 NOTE — Telephone Encounter (Signed)
Pt called and complains of having some spotting.States that she was seen at MAU last Tuesday 11/6 for bleeding after IC. States that she is still spotting, and denies having in IC since evaluated at this time. Pt advised to return to MAU for evaluation

## 2017-02-21 ENCOUNTER — Other Ambulatory Visit: Payer: Self-pay

## 2017-02-21 ENCOUNTER — Encounter (HOSPITAL_COMMUNITY): Payer: Self-pay | Admitting: *Deleted

## 2017-02-21 ENCOUNTER — Ambulatory Visit (HOSPITAL_COMMUNITY)
Admission: RE | Admit: 2017-02-21 | Discharge: 2017-02-21 | Disposition: A | Discharge status: Home / Self Care | Payer: Medicaid Other | Source: Ambulatory Visit | Attending: Certified Nurse Midwife | Admitting: Certified Nurse Midwife

## 2017-02-21 ENCOUNTER — Other Ambulatory Visit (HOSPITAL_COMMUNITY): Payer: Self-pay | Admitting: Obstetrics and Gynecology

## 2017-02-21 ENCOUNTER — Encounter (HOSPITAL_COMMUNITY): Payer: Self-pay

## 2017-02-21 ENCOUNTER — Inpatient Hospital Stay (HOSPITAL_COMMUNITY)
Admission: AD | Admit: 2017-02-21 | Discharge: 2017-02-21 | Disposition: A | Discharge status: Home / Self Care | Payer: Medicaid Other | Source: Ambulatory Visit | Attending: Obstetrics and Gynecology | Admitting: Obstetrics and Gynecology

## 2017-02-21 DIAGNOSIS — N898 Other specified noninflammatory disorders of vagina: Secondary | ICD-10-CM

## 2017-02-21 DIAGNOSIS — O23593 Infection of other part of genital tract in pregnancy, third trimester: Secondary | ICD-10-CM | POA: Insufficient documentation

## 2017-02-21 DIAGNOSIS — Z3683 Encounter for fetal screening for congenital cardiac abnormalities: Secondary | ICD-10-CM | POA: Diagnosis not present

## 2017-02-21 DIAGNOSIS — B9689 Other specified bacterial agents as the cause of diseases classified elsewhere: Secondary | ICD-10-CM | POA: Insufficient documentation

## 2017-02-21 DIAGNOSIS — O26893 Other specified pregnancy related conditions, third trimester: Secondary | ICD-10-CM

## 2017-02-21 DIAGNOSIS — O26853 Spotting complicating pregnancy, third trimester: Secondary | ICD-10-CM | POA: Insufficient documentation

## 2017-02-21 DIAGNOSIS — O4693 Antepartum hemorrhage, unspecified, third trimester: Secondary | ICD-10-CM | POA: Diagnosis present

## 2017-02-21 DIAGNOSIS — Z3A29 29 weeks gestation of pregnancy: Secondary | ICD-10-CM

## 2017-02-21 DIAGNOSIS — O4690 Antepartum hemorrhage, unspecified, unspecified trimester: Secondary | ICD-10-CM | POA: Insufficient documentation

## 2017-02-21 DIAGNOSIS — Z6790 Unspecified blood type, Rh positive: Secondary | ICD-10-CM

## 2017-02-21 DIAGNOSIS — Z3689 Encounter for other specified antenatal screening: Secondary | ICD-10-CM

## 2017-02-21 DIAGNOSIS — N760 Acute vaginitis: Secondary | ICD-10-CM

## 2017-02-21 LAB — URINALYSIS, ROUTINE W REFLEX MICROSCOPIC
BILIRUBIN URINE: NEGATIVE
Glucose, UA: NEGATIVE mg/dL
Hgb urine dipstick: NEGATIVE
KETONES UR: NEGATIVE mg/dL
Leukocytes, UA: NEGATIVE
NITRITE: NEGATIVE
Protein, ur: NEGATIVE mg/dL
Specific Gravity, Urine: 1.029 (ref 1.005–1.030)
pH: 5 (ref 5.0–8.0)

## 2017-02-21 LAB — OB RESULTS CONSOLE GC/CHLAMYDIA: GC PROBE AMP, GENITAL: NEGATIVE

## 2017-02-21 LAB — WET PREP, GENITAL
SPERM: NONE SEEN
TRICH WET PREP: NONE SEEN
Yeast Wet Prep HPF POC: NONE SEEN

## 2017-02-21 MED ORDER — METRONIDAZOLE 500 MG PO TABS: 500.0000 mg | ORAL_TABLET | Freq: Two times a day (BID) | ORAL | 0 refills | Status: DC

## 2017-02-21 NOTE — MAU Note (Signed)
Was here last wk, bleeding after intercourse.  Was told to f/u with MD if still bleeding in 48 hrs. Called office yesterday and was told to come in .  Just had US at [facility].

## 2017-02-21 NOTE — MAU Provider Note (Signed)
History     CSN: [id number]  Arrival date and time: 02/21/17 1011   First Provider Initiated Contact with Patient 02/21/17 1116      Chief Complaint  Patient presents with  . Vaginal Bleeding   G2P1001 @29 .1 wks here with vaginal bleeding. She was seen 1 week ago for postcoital VB. She reports bleeding has continued. Describes as small amount red-pink blood, mostly sees on the toilet paper but sometimes is on liner. No clots. Reports good FM. No LOF, ctx, or pain. No IC since before first bleeding episode.   OB History    Gravida Para Term Preterm AB Living   2 1 1     1    SAB TAB Ectopic Multiple Live Births           1      Past Medical History:  Diagnosis Date  . Headache   . Vitamin D deficiency     Past Surgical History:  Procedure Laterality Date  . NO PAST SURGERIES      Family History  Problem Relation Age of Onset  . Healthy Mother   . Healthy Father   . Breast cancer Maternal Grandmother   . Diabetes Maternal Uncle     Social History   Tobacco Use  . Smoking status: Never Smoker  . Smokeless tobacco: Never Used  Substance Use Topics  . Alcohol use: No  . Drug use: No    Allergies: No Known Allergies  Medications Prior to Admission  Medication Sig Dispense Refill Last Dose  . Prenatal Vit-Fe Fumarate-FA (PRENATAL MULTIVITAMIN) TABS tablet Take 1 tablet by mouth daily at 12 noon.   02/20/2017 at Unknown time  . cyclobenzaprine (FLEXERIL) 10 MG tablet Take 1 tablet (10 mg total) by mouth every 8 (eight) hours as needed for muscle spasms. (Patient not taking: Reported on 02/21/2017) 30 tablet 1 Not Taking at Unknown time  . Elastic Bandages & Supports (COMFORT FIT MATERNITY SUPP MED) MISC Wear daily when ambulating (Patient not taking: Reported on 01/10/2017) 1 each 0 Not Taking    Review of Systems  Gastrointestinal: Negative for abdominal pain.  Genitourinary: Positive for vaginal bleeding.   Physical Exam   Blood pressure (!) 110/56, pulse  74, temperature 98.7 F (37.1 C), temperature source Oral, resp. rate 16, weight 220 lb (99.8 kg), last menstrual period 08/01/2016, SpO2 100 %, unknown if currently breastfeeding.  Physical Exam  Constitutional: She is oriented to person, place, and time. She appears well-developed and well-nourished. No distress.  HENT:  Head: Normocephalic and atraumatic.  Neck: Normal range of motion.  Cardiovascular: Normal rate.  Respiratory: Effort normal. No respiratory distress.  GI: Soft. She exhibits no distension. There is no tenderness.  gravid  Genitourinary:  Genitourinary Comments: External: no lesions or erythema Vagina: rugated, pink, moist, elongated clot coming from os, no active bleeding. No blood in vault, scant brown discharge Cervix closed/long   Musculoskeletal: Normal range of motion.  Neurological: She is alert and oriented to person, place, and time.  Skin: Skin is warm and dry.  Psychiatric: She has a normal mood and affect.  EFM: 145 bpm, mod variability, + accels, no decels Toco: none  Results for orders placed or performed during the hospital encounter of 02/21/17 (from the past 24 hour(s))  Urinalysis, Routine w reflex microscopic     Status: Abnormal   Collection Time: 02/21/17 10:32 AM  Result Value Ref Range   Color, Urine YELLOW YELLOW   APPearance HAZY (A) CLEAR  Specific Gravity, Urine 1.029 1.005 - 1.030   pH 5.0 5.0 - 8.0   Glucose, UA NEGATIVE NEGATIVE mg/dL   Hgb urine dipstick NEGATIVE NEGATIVE   Bilirubin Urine NEGATIVE NEGATIVE   Ketones, ur NEGATIVE NEGATIVE mg/dL   Protein, ur NEGATIVE NEGATIVE mg/dL   Nitrite NEGATIVE NEGATIVE   Leukocytes, UA NEGATIVE NEGATIVE   [place] [facility]fm Ob Follow Up  Result Date: 02/21/2017 ----------------------------------------------------------------------  OBSTETRICS REPORT                      (Signed Final 02/21/2017 10:28 am) ---------------------------------------------------------------------- Patient Info  ID #:        [id number]                          D.O.B.:  1995-05-20 (21 yrs)  Name:       Wendy Gutierrez                  Visit Date: 02/21/2017 09:29 am ---------------------------------------------------------------------- Performed By  Performed By:     Tommi Emery         Ref. Address:     Faculty                    RDMS  Attending:        Darlyn Read MD         Secondary Phy.:   MAU Nursing-                                                             MAU/Triage  Referred By:      Gigi Gin                  Location:         [facility]                    CONSTANT MD ---------------------------------------------------------------------- Orders   #  Description                                 Code   1  [place] MFM OB FOLLOW UP                         [phone]  ----------------------------------------------------------------------   #  Ordered By               Order #        Accession #    Episode #   1  Ledon Snare              [id number]      [id number]     [id number]  ---------------------------------------------------------------------- Indications   [redacted] weeks gestation of pregnancy                Z3A.29   Vaginal bleeding in pregnancy, third trimester O46.93   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 Cord Insertion:  Previously Visualized  Amniotic Fluid  AFI FV:      Subjectively within normal limits  AFI Sum(cm)     %Tile       Largest Pocket(cm)  14.32           48          5.62  RUQ(cm)        RLQ(cm)       LUQ(cm)        LLQ(cm)  4.31          1.82          2.57           5.62 ---------------------------------------------------------------------- Biometry  BPD:      75.1  mm     G. Age:  30w 1d         69  %    CI:         83.1   %    70 - 86                                                          FL/HC:      20.6   %    19.6 - 20.8  HC:      259.8  mm     G. Age:  28w 2d          5  %    HC/AC:      1.03        0.99 - 1.21  AC:      253.1  mm     G. Age:  29w 4d         55  %    FL/BPD:     71.4   %    71 - 87  FL:       53.6  mm     G. Age:  28w 3d         18  %    FL/AC:      21.2   %    20 - 24  HUM:      51.7  mm     G. Age:  30w 1d         67  %  CER:      39.2  mm     G. Age:  33w 1d       > 95  %  Est. FW:    1330  gm    2 lb 15 oz      50  % ---------------------------------------------------------------------- Gestational Age  LMP:           29w 1d        Date:  08/01/16                 EDD:   05/08/17  U/S Today:     29w 1d                                        EDD:   05/08/17  Best:          29w 1d     Det. By:  LMP  (08/01/16)          EDD:   05/08/17 ---------------------------------------------------------------------- Anatomy  Cranium:               Appears normal         Aortic Arch:            Appears normal  Cavum:                 Appears normal         Ductal Arch:            Previously seen  Ventricles:            Appears normal         Diaphragm:              Previously seen  Choroid Plexus:        Previously seen        Stomach:                Appears normal, left                                                                        sided  Cerebellum:            Appears normal         Abdomen:                Appears normal  Posterior Fossa:       Previously seen        Abdominal Wall:         Previously seen  Nuchal Fold:           Previously seen        Cord Vessels:           Appears normal (3                                                                        vessel  cord)  Face:                  Orbits and profile     Kidneys:                Appear normal                         previously seen  Lips:  Appears normal         Bladder:                Appears normal  Thoracic:              Appears normal         Spine:                  Previously seen  Heart:                 Previously seen        Upper Extremities:      Previously seen  RVOT:                  Previously seen        Lower Extremities:      Previously seen  LVOT:                  Previously seen  Other:  Fetus appears to be a female. Heels prev. visualized.  Technically          difficult due to maternal habitus and fetal position. Nasal bone prev.          visualized. ---------------------------------------------------------------------- Cervix Uterus Adnexa  Cervix  Not visualized (advanced GA >29wks)  Uterus  No abnormality visualized.  Left Ovary  Size(cm)       2.5  x   2.6    x  2.3       Vol(ml): 7.8  Within normal limits.  Right Ovary  Size(cm)       3.4  x   2.4    x  1.9       Vol(ml): 8.1  Within normal limits.  Cul De Sac:   No free fluid seen.  Adnexa:       No abnormality visualized. ---------------------------------------------------------------------- Comments  Normal fetal echocardiogram. ---------------------------------------------------------------------- Impression  Single living intrauterine pregnancy at 29w 1d.  Cephalic presentation.  Placenta Anterior, above cervical os.  Normal amniotic fluid volume.  Appropriate interval fetal growth.  Normal interval fetal anatomy. ---------------------------------------------------------------------- Recommendations  Follow-up ultrasounds as clinically indicated. ----------------------------------------------------------------------                   Darlyn Read, MD Electronically Signed Final Report   02/21/2017 10:28 am ----------------------------------------------------------------------   MAU Course  Procedures  MDM Labs  ordered and reviewed. MFM [place] today >no previa or abruption. No evidence of PTL or active bleeding. Will treat BV. Maintain pelvic rest until all bleeding has stopped. Stable for discharge home.  Assessment and Plan   1. [redacted] weeks gestation of pregnancy   2. NST (non-stress test) reactive   3. Spotting affecting pregnancy in third trimester   4. Blood type, Rh positive   5. Bacterial vaginosis    Discharge home Follow up in OB office next week as scheduled PTL/bleeding return precautions Rx Flagyl  Allergies as of 02/21/2017   No Known Allergies     Medication List    TAKE these medications   COMFORT FIT MATERNITY SUPP MED Misc Wear daily when ambulating   cyclobenzaprine 10 MG tablet Commonly known as:  FLEXERIL Take 1 tablet (10 mg total) by mouth every 8 (eight) hours as needed for muscle spasms.   metroNIDAZOLE 500 MG tablet Commonly known as:  FLAGYL Take 1 tablet (500 mg total) 2 (two) times daily by mouth.   prenatal multivitamin Tabs tablet Take 1 tablet by mouth daily at 12  noon.      Donette LarryMelanie Oronde Hallenbeck, CNM 02/21/2017, 11:29 AM

## 2017-02-21 NOTE — Discharge Instructions (Signed)
Bacterial Vaginosis Bacterial vaginosis is an infection of the vagina. It happens when too many germs (bacteria) grow in the vagina. This infection puts you at risk for infections from sex (STIs). Treating this infection can lower your risk for some STIs. You should also treat this if you are pregnant. It can cause your baby to be born early. Follow these instructions at home: Medicines  Take over-the-counter and prescription medicines only as told by your doctor.  Take or use your antibiotic medicine as told by your doctor. Do not stop taking or using it even if you start to feel better. General instructions  If you your sexual partner is a woman, tell her that you have this infection. She needs to get treatment if she has symptoms. If you have a female partner, he does not need to be treated.  During treatment: ? Avoid sex. ? Do not douche. ? Avoid alcohol as told. ? Avoid breastfeeding as told.  Drink enough fluid to keep your pee (urine) clear or pale yellow.  Keep your vagina and butt (rectum) clean. ? Wash the area with warm water every day. ? Wipe from front to back after you use the toilet.  Keep all follow-up visits as told by your doctor. This is important. Preventing this condition  Do not douche.  Use only warm water to wash around your vagina.  Use protection when you have sex. This includes: ? Latex condoms. ? Dental dams.  Limit how many people you have sex with. It is best to only have sex with the same person (be monogamous).  Get tested for STIs. Have your partner get tested.  Wear underwear that is cotton or lined with cotton.  Avoid tight pants and pantyhose. This is most important in summer.  Do not use any products that have nicotine or tobacco in them. These include cigarettes and e-cigarettes. If you need help quitting, ask your doctor.  Do not use illegal drugs.  Limit how much alcohol you drink. Contact a doctor if:  Your symptoms do not get  better, even after you are treated.  You have more discharge or pain when you pee (urinate).  You have a fever.  You have pain in your belly (abdomen).  You have pain with sex.  Your bleed from your vagina between periods. Summary  This infection happens when too many germs (bacteria) grow in the vagina.  Treating this condition can lower your risk for some infections from sex (STIs).  You should also treat this if you are pregnant. It can cause early (premature) birth.  Do not stop taking or using your antibiotic medicine even if you start to feel better. This information is not intended to replace advice given to you by your health care provider. Make sure you discuss any questions you have with your health care provider. Document Released: 01/05/2008 Document Revised: 12/12/2015 Document Reviewed: 12/12/2015 Elsevier Interactive Patient Education  2017 [employer] Vaginal Bleeding During Pregnancy, Third Trimester A small amount of bleeding (spotting) from the vagina is common in pregnancy. Sometimes the bleeding is normal and is not a problem, and sometimes it is a sign of something serious. Be sure to tell your doctor about any bleeding from your vagina right away. Follow these instructions at home:  Watch your condition for any changes.  Follow your doctor's instructions about how active you can be.  If you are on bed rest: ? You may need to stay in bed and only get up to  use the bathroom. ? You may be allowed to do some activities. ? If you need help, make plans for someone to help you.  Write down: ? The number of pads you use each day. ? How often you change pads. ? How soaked (saturated) your pads are.  Do not use tampons.  Do not douche.  Do not have sex or orgasms until your doctor says it is okay.  Follow your doctor's advice about lifting, driving, and doing physical activities.  If you pass any tissue from your vagina, save the tissue so you can show  it to your doctor.  Only take medicines as told by your doctor.  Do not take aspirin because it can make you bleed.  Keep all follow-up visits as told by your doctor. Contact a doctor if:  You bleed from your vagina.  You have cramps.  You have labor pains.  You have a fever that does not go away after you take medicine. Get help right away if:  You have very bad cramps in your back or belly (abdomen).  You have chills.  You have a gush of fluid from your vagina.  You pass large clots or tissue from your vagina.  You bleed more.  You feel light-headed or weak.  You pass out (faint).  You do not feel your baby move around as much as before. This information is not intended to replace advice given to you by your health care provider. Make sure you discuss any questions you have with your health care provider. Document Released: 08/12/2013 Document Revised: 09/03/2015 Document Reviewed: 12/03/2012 Elsevier Interactive Patient Education  [employer]2018 [employer] Inc[employer]

## 2017-02-22 LAB — GC/CHLAMYDIA PROBE AMP (~~LOC~~) NOT AT ARMC
CHLAMYDIA, DNA PROBE: NEGATIVE
NEISSERIA GONORRHEA: NEGATIVE

## 2017-02-28 ENCOUNTER — Encounter: Payer: Self-pay | Admitting: Obstetrics and Gynecology

## 2017-02-28 ENCOUNTER — Ambulatory Visit (INDEPENDENT_AMBULATORY_CARE_PROVIDER_SITE_OTHER): Payer: Medicaid Other | Admitting: Obstetrics and Gynecology

## 2017-02-28 VITALS — BP 118/75 | HR 102 | Wt 219.7 lb

## 2017-02-28 DIAGNOSIS — Z3480 Encounter for supervision of other normal pregnancy, unspecified trimester: Secondary | ICD-10-CM

## 2017-02-28 DIAGNOSIS — O35BXX0 Maternal care for other (suspected) fetal abnormality and damage, fetal cardiac anomalies, not applicable or unspecified: Secondary | ICD-10-CM

## 2017-02-28 DIAGNOSIS — O358XX0 Maternal care for other (suspected) fetal abnormality and damage, not applicable or unspecified: Secondary | ICD-10-CM

## 2017-02-28 NOTE — Progress Notes (Signed)
Patient reports good fetal movement with some pressure, denies contractions. 

## 2017-02-28 NOTE — Progress Notes (Signed)
Subjective:  Wendy Gutierrez is a 21 y.o. G2P1001 at 1326w1d being seen today for ongoing prenatal care.  She is currently monitored for the following issues for this low-risk pregnancy and has Migraine without aura and without status migrainosus, not intractable; Encounter for supervision of other normal pregnancy, unspecified trimester; LGSIL on Pap smear of cervix; Maternal obesity, antepartum; Rubella non-immune status, antepartum; Fetal cardiac anomaly affecting pregnancy, antepartum; and Back pain affecting pregnancy on their problem list.  Patient reports general discomforts of pregnancy.  Contractions: Not present. Vag. Bleeding: None.  Movement: Present. Denies leaking of fluid.   The following portions of the patient's history were reviewed and updated as appropriate: allergies, current medications, past family history, past medical history, past social history, past surgical history and problem list. Problem list updated.  Objective:   Vitals:   02/28/17 0901  BP: 118/75  Pulse: (!) 102  Weight: 219 lb 11.2 oz (99.7 kg)    Fetal Status: Fetal Heart Rate (bpm): 140   Movement: Present     General:  Alert, oriented and cooperative. Patient is in no acute distress.  Skin: Skin is warm and dry. No rash noted.   Cardiovascular: Normal heart rate noted  Respiratory: Normal respiratory effort, no problems with respiration noted  Abdomen: Soft, gravid, appropriate for gestational age. Pain/Pressure: Present     Pelvic:  Cervical exam deferred        Extremities: Normal range of motion.  Edema: Trace  Mental Status: Normal mood and affect. Normal behavior. Normal judgment and thought content.   Urinalysis:      Assessment and Plan:  Pregnancy: G2P1001 at 1726w1d  1. Encounter for supervision of other normal pregnancy, unspecified trimester Stable To schedule fasting appt tomorrow for 28 week labs  2. Anomaly of heart of fetus affecting pregnancy, antepartum, single or unspecified  fetus Normal Fetal ECHO  Preterm labor symptoms and general obstetric precautions including but not limited to vaginal bleeding, contractions, leaking of fluid and fetal movement were reviewed in detail with the patient. Please refer to After Visit Summary for other counseling recommendations.  Return in about 2 weeks (around 03/14/2017) for OB visit.   Hermina StaggersErvin, Ahmari Duerson L, MD

## 2017-03-07 ENCOUNTER — Other Ambulatory Visit: Payer: Medicaid Other

## 2017-03-07 DIAGNOSIS — Z3480 Encounter for supervision of other normal pregnancy, unspecified trimester: Secondary | ICD-10-CM

## 2017-03-08 LAB — CBC
HEMATOCRIT: 34.6 % (ref 34.0–46.6)
HEMOGLOBIN: 11 g/dL — AB (ref 11.1–15.9)
MCH: 26.1 pg — ABNORMAL LOW (ref 26.6–33.0)
MCHC: 31.8 g/dL (ref 31.5–35.7)
MCV: 82 fL (ref 79–97)
Platelets: 216 10*3/uL (ref 150–379)
RBC: 4.21 x10E6/uL (ref 3.77–5.28)
RDW: 14.5 % (ref 12.3–15.4)
WBC: 9.1 10*3/uL (ref 3.4–10.8)

## 2017-03-08 LAB — HIV ANTIBODY (ROUTINE TESTING W REFLEX): HIV Screen 4th Generation wRfx: NONREACTIVE

## 2017-03-08 LAB — GLUCOSE TOLERANCE, 2 HOURS W/ 1HR
GLUCOSE, 2 HOUR: 137 mg/dL (ref 65–152)
Glucose, 1 hour: 135 mg/dL (ref 65–179)
Glucose, Fasting: 78 mg/dL (ref 65–91)

## 2017-03-08 LAB — RPR: RPR: NONREACTIVE

## 2017-03-14 ENCOUNTER — Telehealth: Payer: Self-pay

## 2017-03-14 ENCOUNTER — Encounter (HOSPITAL_COMMUNITY): Payer: Self-pay | Admitting: *Deleted

## 2017-03-14 ENCOUNTER — Inpatient Hospital Stay (HOSPITAL_COMMUNITY)
Admission: AD | Admit: 2017-03-14 | Discharge: 2017-03-14 | Disposition: A | Discharge status: Home / Self Care | Payer: Medicaid Other | Source: Ambulatory Visit | Attending: Family Medicine | Admitting: Family Medicine

## 2017-03-14 ENCOUNTER — Other Ambulatory Visit: Payer: Self-pay

## 2017-03-14 DIAGNOSIS — O99210 Obesity complicating pregnancy, unspecified trimester: Secondary | ICD-10-CM

## 2017-03-14 DIAGNOSIS — O98813 Other maternal infectious and parasitic diseases complicating pregnancy, third trimester: Secondary | ICD-10-CM | POA: Diagnosis not present

## 2017-03-14 DIAGNOSIS — Z3480 Encounter for supervision of other normal pregnancy, unspecified trimester: Secondary | ICD-10-CM

## 2017-03-14 DIAGNOSIS — O26893 Other specified pregnancy related conditions, third trimester: Secondary | ICD-10-CM | POA: Diagnosis not present

## 2017-03-14 DIAGNOSIS — Z3A32 32 weeks gestation of pregnancy: Secondary | ICD-10-CM | POA: Diagnosis not present

## 2017-03-14 DIAGNOSIS — Z2839 Other underimmunization status: Secondary | ICD-10-CM

## 2017-03-14 DIAGNOSIS — Z283 Underimmunization status: Secondary | ICD-10-CM

## 2017-03-14 DIAGNOSIS — R109 Unspecified abdominal pain: Secondary | ICD-10-CM

## 2017-03-14 DIAGNOSIS — B373 Candidiasis of vulva and vagina: Secondary | ICD-10-CM | POA: Insufficient documentation

## 2017-03-14 DIAGNOSIS — O9989 Other specified diseases and conditions complicating pregnancy, childbirth and the puerperium: Secondary | ICD-10-CM

## 2017-03-14 DIAGNOSIS — B3731 Acute candidiasis of vulva and vagina: Secondary | ICD-10-CM

## 2017-03-14 LAB — WET PREP, GENITAL
Clue Cells Wet Prep HPF POC: NONE SEEN
Sperm: NONE SEEN
TRICH WET PREP: NONE SEEN

## 2017-03-14 LAB — URINALYSIS, ROUTINE W REFLEX MICROSCOPIC
BILIRUBIN URINE: NEGATIVE
Glucose, UA: NEGATIVE mg/dL
HGB URINE DIPSTICK: NEGATIVE
Ketones, ur: NEGATIVE mg/dL
LEUKOCYTES UA: NEGATIVE
NITRITE: NEGATIVE
PH: 5 (ref 5.0–8.0)
Protein, ur: 30 mg/dL — AB
SPECIFIC GRAVITY, URINE: 1.025 (ref 1.005–1.030)

## 2017-03-14 LAB — AMNISURE RUPTURE OF MEMBRANE (ROM) NOT AT ARMC: AMNISURE: NEGATIVE

## 2017-03-14 MED ORDER — TERCONAZOLE 0.4 % VA CREA: 1.0000 | TOPICAL_CREAM | Freq: Every day | VAGINAL | 0 refills | Status: AC

## 2017-03-14 NOTE — MAU Note (Signed)
Last wk on Friday,  Noted ? Leaking when she stands or sits.  Now is having more pain and pressure, tightness in stomach. They had been infrequent, noted an increase today, couple times an hour.

## 2017-03-14 NOTE — Telephone Encounter (Signed)
Pt called stating that she is having some LOF since last week.She states that she it was urine or discharge. She also complains of having increase of vaginal pain and pressure that started this past weekend. Pt advised to go to MAU for evaluation

## 2017-03-14 NOTE — MAU Provider Note (Signed)
History     CSN: [id number]  Arrival date and time: 03/14/17 1213   First Provider Initiated Contact with Patient 03/14/17 1308      Chief Complaint  Patient presents with  . Rupture of Membranes  . Abdominal Pain   HPI  Ms. Darcey Noraaisley Smalls is a 21 y.o. G2P1001 at 5320w1d gestation presenting to MAU with complaints of ?leaking fluid on Friday 11/30 and on-going uterine tightening and pelvic pressure "for months". She states she was at work sitting on a stool after using the BR and noticed her underwear became wet. She reports it has been randomly happening since Friday, but not today. She called [facility] today and they told her to "come here to get looked at". She states she has told Dr. Clearance CootsHarper about the abdominal tightening and pelvic pressure. She was told by Dr. Clearance CootsHarper to "take it easy at work" and he gave her note for light duty while at work. She denies any N/V/D, fever, or VB.  Past Medical History:  Diagnosis Date  . Headache   . Vitamin D deficiency     Past Surgical History:  Procedure Laterality Date  . NO PAST SURGERIES      Family History  Problem Relation Age of Onset  . Healthy Mother   . Healthy Father   . Breast cancer Maternal Grandmother   . Diabetes Maternal Uncle     Social History   Tobacco Use  . Smoking status: Never Smoker  . Smokeless tobacco: Never Used  Substance Use Topics  . Alcohol use: No  . Drug use: No    Allergies: No Known Allergies  Medications Prior to Admission  Medication Sig Dispense Refill Last Dose  . cyclobenzaprine (FLEXERIL) 10 MG tablet Take 1 tablet (10 mg total) by mouth every 8 (eight) hours as needed for muscle spasms. (Patient not taking: Reported on 02/21/2017) 30 tablet 1 Not Taking  . Elastic Bandages & Supports (COMFORT FIT MATERNITY SUPP MED) MISC Wear daily when ambulating (Patient not taking: Reported on 01/10/2017) 1 each 0 Not Taking  . metroNIDAZOLE (FLAGYL) 500 MG tablet Take 1 tablet (500 mg total) 2 (two)  times daily by mouth. 14 tablet 0 Taking  . Prenatal Vit-Fe Fumarate-FA (PRENATAL MULTIVITAMIN) TABS tablet Take 1 tablet by mouth daily at 12 noon.   Taking    Review of Systems  Constitutional: Negative.   HENT: Negative.   Eyes: Negative.   Respiratory: Negative.   Cardiovascular: Negative.   Gastrointestinal: Positive for abdominal pain ("tightening").  Endocrine: Negative.   Genitourinary: Positive for pelvic pain ("pressure") and vaginal discharge ("leaking since Friday (11/30)").  Musculoskeletal: Negative.   Skin: Negative.   Allergic/Immunologic: Negative.   Neurological: Negative.   Hematological: Negative.   Psychiatric/Behavioral: Negative.    Physical Exam   Blood pressure 123/69, pulse 86, temperature 98.3 F (36.8 C), temperature source Oral, resp. rate 16, weight 101.9 kg (224 lb 12 oz), last menstrual period 08/01/2016, SpO2 99 %.  Physical Exam  MAU Course  Procedures  MDM [facility]UA Amnisure Wet Prep NST - FHR: 135 bpm / moderate variability / accels present / decels absent / TOCO: none  Results for orders placed or performed during the hospital encounter of 03/14/17 (from the past 24 hour(s))  Urinalysis, Routine w reflex microscopic     Status: Abnormal   Collection Time: 03/14/17 12:24 PM  Result Value Ref Range   Color, Urine YELLOW YELLOW   APPearance HAZY (A) CLEAR   Specific Gravity, Urine  1.025 1.005 - 1.030   pH 5.0 5.0 - 8.0   Glucose, UA NEGATIVE NEGATIVE mg/dL   Hgb urine dipstick NEGATIVE NEGATIVE   Bilirubin Urine NEGATIVE NEGATIVE   Ketones, ur NEGATIVE NEGATIVE mg/dL   Protein, ur 30 (A) NEGATIVE mg/dL   Nitrite NEGATIVE NEGATIVE   Leukocytes, UA NEGATIVE NEGATIVE   RBC / HPF 0-5 0 - 5 RBC/hpf   WBC, UA 0-5 0 - 5 WBC/hpf   Bacteria, UA RARE (A) NONE SEEN   Squamous Epithelial / LPF 6-30 (A) NONE SEEN   Mucus PRESENT   Wet prep, genital     Status: Abnormal   Collection Time: 03/14/17  1:20 PM  Result Value Ref Range   Yeast Wet  Prep HPF POC PRESENT (A) NONE SEEN   Trich, Wet Prep NONE SEEN NONE SEEN   Clue Cells Wet Prep HPF POC NONE SEEN NONE SEEN   WBC, Wet Prep HPF POC FEW (A) NONE SEEN   Sperm NONE SEEN     Assessment and Plan  Candida vaginitis - Rx for Terazol 0.75% cream p.v. Hs x 5 days - Information provided on candida vaginitis - Keep scheduled appt on 12/5 with [facility] - Discharge home - Patient verbalized an understanding of the plan of care and agrees.    Raelyn Moraolitta Iyonna Rish, MSN, CNM 03/14/2017, 1:15 PM

## 2017-03-15 ENCOUNTER — Encounter: Payer: Self-pay | Admitting: Obstetrics and Gynecology

## 2017-03-15 ENCOUNTER — Ambulatory Visit (INDEPENDENT_AMBULATORY_CARE_PROVIDER_SITE_OTHER): Payer: Medicaid Other | Admitting: Obstetrics and Gynecology

## 2017-03-15 VITALS — BP 111/72 | HR 99 | Wt 224.6 lb

## 2017-03-15 DIAGNOSIS — Z3483 Encounter for supervision of other normal pregnancy, third trimester: Secondary | ICD-10-CM

## 2017-03-15 DIAGNOSIS — O358XX0 Maternal care for other (suspected) fetal abnormality and damage, not applicable or unspecified: Secondary | ICD-10-CM

## 2017-03-15 DIAGNOSIS — Z3480 Encounter for supervision of other normal pregnancy, unspecified trimester: Secondary | ICD-10-CM

## 2017-03-15 DIAGNOSIS — O35BXX0 Maternal care for other (suspected) fetal abnormality and damage, fetal cardiac anomalies, not applicable or unspecified: Secondary | ICD-10-CM

## 2017-03-15 NOTE — Progress Notes (Signed)
Subjective:  Wendy Gutierrez is a 21 y.o. G2P1001 at 10680w2d being seen today for ongoing prenatal care.  She is currently monitored for the following issues for this low-risk pregnancy and has Migraine without aura and without status migrainosus, not intractable; Encounter for supervision of other normal pregnancy, unspecified trimester; LGSIL on Pap smear of cervix; Maternal obesity, antepartum; Rubella non-immune status, antepartum; Fetal cardiac anomaly affecting pregnancy, antepartum; Back pain affecting pregnancy; and Candida vaginitis on their problem list.  Patient reports seen yesterday in MAU and Dx with yeast infection. Has not picked up Rx yet. Pt encouraged to do so.  Contractions: Irregular. Vag. Bleeding: None.  Movement: Present. Denies leaking of fluid.   The following portions of the patient's history were reviewed and updated as appropriate: allergies, current medications, past family history, past medical history, past social history, past surgical history and problem list. Problem list updated.  Objective:   Vitals:   03/15/17 0819  BP: 111/72  Pulse: 99  Weight: 224 lb 9.6 oz (101.9 kg)    Fetal Status: Fetal Heart Rate (bpm): 142   Movement: Present     General:  Alert, oriented and cooperative. Patient is in no acute distress.  Skin: Skin is warm and dry. No rash noted.   Cardiovascular: Normal heart rate noted  Respiratory: Normal respiratory effort, no problems with respiration noted  Abdomen: Soft, gravid, appropriate for gestational age. Pain/Pressure: Present     Pelvic:  Cervical exam deferred        Extremities: Normal range of motion.  Edema: Trace  Mental Status: Normal mood and affect. Normal behavior. Normal judgment and thought content.   Urinalysis:      Assessment and Plan:  Pregnancy: G2P1001 at 4380w2d  1. Encounter for supervision of other normal pregnancy, unspecified trimester Stable  2. Anomaly of heart of fetus affecting pregnancy,  antepartum, single or unspecified fetus Nl ECHO  Preterm labor symptoms and general obstetric precautions including but not limited to vaginal bleeding, contractions, leaking of fluid and fetal movement were reviewed in detail with the patient. Please refer to After Visit Summary for other counseling recommendations.  Return in about 2 weeks (around 03/29/2017) for OB visit.   Hermina StaggersErvin, Kassey Laforest L, MD

## 2017-03-15 NOTE — Patient Instructions (Signed)
Third Trimester of Pregnancy The third trimester is from week 28 through week 40 (months 7 through 9). The third trimester is a time when the unborn baby (fetus) is growing rapidly. At the end of the ninth month, the fetus is about 20 inches in length and weighs 6-10 pounds. Body changes during your third trimester Your body will continue to go through many changes during pregnancy. The changes vary from woman to woman. During the third trimester:  Your weight will continue to increase. You can expect to gain 25-35 pounds (11-16 kg) by the end of the pregnancy.  You may begin to get stretch marks on your hips, abdomen, and breasts.  You may urinate more often because the fetus is moving lower into your pelvis and pressing on your bladder.  You may develop or continue to have heartburn. This is caused by increased hormones that slow down muscles in the digestive tract.  You may develop or continue to have constipation because increased hormones slow digestion and cause the muscles that push waste through your intestines to relax.  You may develop hemorrhoids. These are swollen veins (varicose veins) in the rectum that can itch or be painful.  You may develop swollen, bulging veins (varicose veins) in your legs.  You may have increased body aches in the pelvis, back, or thighs. This is due to weight gain and increased hormones that are relaxing your joints.  You may have changes in your hair. These can include thickening of your hair, rapid growth, and changes in texture. Some women also have hair loss during or after pregnancy, or hair that feels dry or thin. Your hair will most likely return to normal after your baby is born.  Your breasts will continue to grow and they will continue to become tender. A yellow fluid (colostrum) may leak from your breasts. This is the first milk you are producing for your baby.  Your belly button may stick out.  You may notice more swelling in your hands,  face, or ankles.  You may have increased tingling or numbness in your hands, arms, and legs. The skin on your belly may also feel numb.  You may feel short of breath because of your expanding uterus.  You may have more problems sleeping. This can be caused by the size of your belly, increased need to urinate, and an increase in your body's metabolism.  You may notice the fetus "dropping," or moving lower in your abdomen (lightening).  You may have increased vaginal discharge.  You may notice your joints feel loose and you may have pain around your pelvic bone.  What to expect at prenatal visits You will have prenatal exams every 2 weeks until week 36. Then you will have weekly prenatal exams. During a routine prenatal visit:  You will be weighed to make sure you and the baby are growing normally.  Your blood pressure will be taken.  Your abdomen will be measured to track your baby's growth.  The fetal heartbeat will be listened to.  Any test results from the previous visit will be discussed.  You may have a cervical check near your due date to see if your cervix has softened or thinned (effaced).  You will be tested for Group B streptococcus. This happens between 35 and 37 weeks.  Your health care provider may ask you:  What your birth plan is.  How you are feeling.  If you are feeling the baby move.  If you have had   any abnormal symptoms, such as leaking fluid, bleeding, severe headaches, or abdominal cramping.  If you are using any tobacco products, including cigarettes, chewing tobacco, and electronic cigarettes.  If you have any questions.  Other tests or screenings that may be performed during your third trimester include:  Blood tests that check for low iron levels (anemia).  Fetal testing to check the health, activity level, and growth of the fetus. Testing is done if you have certain medical conditions or if there are problems during the  pregnancy.  Nonstress test (NST). This test checks the health of your baby to make sure there are no signs of problems, such as the baby not getting enough oxygen. During this test, a belt is placed around your belly. The baby is made to move, and its heart rate is monitored during movement.  What is false labor? False labor is a condition in which you feel small, irregular tightenings of the muscles in the womb (contractions) that usually go away with rest, changing position, or drinking water. These are called Braxton Hicks contractions. Contractions may last for hours, days, or even weeks before true labor sets in. If contractions come at regular intervals, become more frequent, increase in intensity, or become painful, you should see your health care provider. What are the signs of labor?  Abdominal cramps.  Regular contractions that start at 10 minutes apart and become stronger and more frequent with time.  Contractions that start on the top of the uterus and spread down to the lower abdomen and back.  Increased pelvic pressure and dull back pain.  A watery or bloody mucus discharge that comes from the vagina.  Leaking of amniotic fluid. This is also known as your "water breaking." It could be a slow trickle or a gush. Let your health care provider know if it has a color or strange odor. If you have any of these signs, call your health care provider right away, even if it is before your due date. Follow these instructions at home: Medicines  Follow your health care provider's instructions regarding medicine use. Specific medicines may be either safe or unsafe to take during pregnancy.  Take a prenatal vitamin that contains at least 600 micrograms (mcg) of folic acid.  If you develop constipation, try taking a stool softener if your health care provider approves. Eating and drinking  Eat a balanced diet that includes fresh fruits and vegetables, whole grains, good sources of protein  such as meat, eggs, or tofu, and low-fat dairy. Your health care provider will help you determine the amount of weight gain that is right for you.  Avoid raw meat and uncooked cheese. These carry germs that can cause birth defects in the baby.  If you have low calcium intake from food, talk to your health care provider about whether you should take a daily calcium supplement.  Eat four or five small meals rather than three large meals a day.  Limit foods that are high in fat and processed sugars, such as fried and sweet foods.  To prevent constipation: ? Drink enough fluid to keep your urine clear or pale yellow. ? Eat foods that are high in fiber, such as fresh fruits and vegetables, whole grains, and beans. Activity  Exercise only as directed by your health care provider. Most women can continue their usual exercise routine during pregnancy. Try to exercise for 30 minutes at least 5 days a week. Stop exercising if you experience uterine contractions.  Avoid heavy   lifting.  Do not exercise in extreme heat or humidity, or at high altitudes.  Wear low-heel, comfortable shoes.  Practice good posture.  You may continue to have sex unless your health care provider tells you otherwise. Relieving pain and discomfort  Take frequent breaks and rest with your legs elevated if you have leg cramps or low back pain.  Take warm sitz baths to soothe any pain or discomfort caused by hemorrhoids. Use hemorrhoid cream if your health care provider approves.  Wear a good support bra to prevent discomfort from breast tenderness.  If you develop varicose veins: ? Wear support pantyhose or compression stockings as told by your healthcare provider. ? Elevate your feet for 15 minutes, 3-4 times a day. Prenatal care  Write down your questions. Take them to your prenatal visits.  Keep all your prenatal visits as told by your health care provider. This is important. Safety  Wear your seat belt at  all times when driving.  Make a list of emergency phone numbers, including numbers for family, friends, the hospital, and police and fire departments. General instructions  Avoid cat litter boxes and soil used by cats. These carry germs that can cause birth defects in the baby. If you have a cat, ask someone to clean the litter box for you.  Do not travel far distances unless it is absolutely necessary and only with the approval of your health care provider.  Do not use hot tubs, steam rooms, or saunas.  Do not drink alcohol.  Do not use any products that contain nicotine or tobacco, such as cigarettes and e-cigarettes. If you need help quitting, ask your health care provider.  Do not use any medicinal herbs or unprescribed drugs. These chemicals affect the formation and growth of the baby.  Do not douche or use tampons or scented sanitary pads.  Do not cross your legs for long periods of time.  To prepare for the arrival of your baby: ? Take prenatal classes to understand, practice, and ask questions about labor and delivery. ? Make a trial run to the hospital. ? Visit the hospital and tour the maternity area. ? Arrange for maternity or paternity leave through employers. ? Arrange for family and friends to take care of pets while you are in the hospital. ? Purchase a rear-facing car seat and make sure you know how to install it in your car. ? Pack your hospital bag. ? Prepare the baby's nursery. Make sure to remove all pillows and stuffed animals from the baby's crib to prevent suffocation.  Visit your dentist if you have not gone during your pregnancy. Use a soft toothbrush to brush your teeth and be gentle when you floss. Contact a health care provider if:  You are unsure if you are in labor or if your water has broken.  You become dizzy.  You have mild pelvic cramps, pelvic pressure, or nagging pain in your abdominal area.  You have lower back pain.  You have persistent  nausea, vomiting, or diarrhea.  You have an unusual or bad smelling vaginal discharge.  You have pain when you urinate. Get help right away if:  Your water breaks before 37 weeks.  You have regular contractions less than 5 minutes apart before 37 weeks.  You have a fever.  You are leaking fluid from your vagina.  You have spotting or bleeding from your vagina.  You have severe abdominal pain or cramping.  You have rapid weight loss or weight gain.    You have shortness of breath with chest pain.  You notice sudden or extreme swelling of your face, hands, ankles, feet, or legs.  Your baby makes fewer than 10 movements in 2 hours.  You have severe headaches that do not go away when you take medicine.  You have vision changes. Summary  The third trimester is from week 28 through week 40, months 7 through 9. The third trimester is a time when the unborn baby (fetus) is growing rapidly.  During the third trimester, your discomfort may increase as you and your baby continue to gain weight. You may have abdominal, leg, and back pain, sleeping problems, and an increased need to urinate.  During the third trimester your breasts will keep growing and they will continue to become tender. A yellow fluid (colostrum) may leak from your breasts. This is the first milk you are producing for your baby.  False labor is a condition in which you feel small, irregular tightenings of the muscles in the womb (contractions) that eventually go away. These are called Braxton Hicks contractions. Contractions may last for hours, days, or even weeks before true labor sets in.  Signs of labor can include: abdominal cramps; regular contractions that start at 10 minutes apart and become stronger and more frequent with time; watery or bloody mucus discharge that comes from the vagina; increased pelvic pressure and dull back pain; and leaking of amniotic fluid. This information is not intended to replace advice  given to you by your health care provider. Make sure you discuss any questions you have with your health care provider. Document Released: 03/22/2001 Document Revised: 09/03/2015 Document Reviewed: 05/29/2012 Elsevier Interactive Patient Education  2017 [employer]  

## 2017-03-28 ENCOUNTER — Ambulatory Visit (INDEPENDENT_AMBULATORY_CARE_PROVIDER_SITE_OTHER): Payer: Medicaid Other | Admitting: Obstetrics & Gynecology

## 2017-03-28 VITALS — BP 126/74 | HR 102 | Wt 225.0 lb

## 2017-03-28 DIAGNOSIS — Z3480 Encounter for supervision of other normal pregnancy, unspecified trimester: Secondary | ICD-10-CM

## 2017-03-28 NOTE — Progress Notes (Signed)
   PRENATAL VISIT NOTE  Subjective:  Wendy Gutierrez is a 21 y.o. G2P1001 at 223w1d being seen today for ongoing prenatal care.  She is currently monitored for the following issues for this low-risk pregnancy and has Migraine without aura and without status migrainosus, not intractable; Encounter for supervision of other normal pregnancy, unspecified trimester; LGSIL on Pap smear of cervix; Maternal obesity, antepartum; Rubella non-immune status, antepartum; Back pain affecting pregnancy; and Candida vaginitis on their problem list.  Patient reports pelvic pressure.  Contractions: Irregular. Vag. Bleeding: None.  Movement: Present. Denies leaking of fluid.   The following portions of the patient's history were reviewed and updated as appropriate: allergies, current medications, past family history, past medical history, past social history, past surgical history and problem list. Problem list updated.  Objective:   Vitals:   03/28/17 0954  BP: 126/74  Pulse: (!) 102  Weight: 225 lb (102.1 kg)    Fetal Status:     Movement: Present     General:  Alert, oriented and cooperative. Patient is in no acute distress.  Skin: Skin is warm and dry. No rash noted.   Cardiovascular: Normal heart rate noted  Respiratory: Normal respiratory effort, no problems with respiration noted  Abdomen: Soft, gravid, appropriate for gestational age.  Pain/Pressure: Present     Pelvic: Cervical exam performed        Extremities: Normal range of motion.  Edema: Trace  Mental Status:  Normal mood and affect. Normal behavior. Normal judgment and thought content.   Assessment and Plan:  Pregnancy: G2P1001 at 283w1d  1. Encounter for supervision of other normal pregnancy, unspecified trimester Discomforts of pregnancy  Preterm labor symptoms and general obstetric precautions including but not limited to vaginal bleeding, contractions, leaking of fluid and fetal movement were reviewed in detail with the  patient. Please refer to After Visit Summary for other counseling recommendations.  Return in about 2 weeks (around 04/11/2017).   Scheryl DarterJames Arnold, MD

## 2017-03-28 NOTE — Patient Instructions (Signed)

## 2017-03-29 ENCOUNTER — Telehealth: Payer: Self-pay | Admitting: Pediatrics

## 2017-03-29 NOTE — Telephone Encounter (Signed)
I called patient back. She reports at yesterday's ROB babies HR dropped from 175 to 160 when nurse was check FHTs.  She states she was concerned.  I advised her that some variables in FHTs are normal.  I advised her to continue to monitor FM, if anything out of the ordinary contact office for f/u.  I also advised fetal echo was WNL and gave reassurance.  I advised that Dr. Debroah LoopArnold did not mention anything in regards to fetal heart issues/concerns in the note from yesterday's ROB.  I advised again to contact office with further questions. She voiced thanks, understanding, and agreed with plan.

## 2017-03-29 NOTE — Telephone Encounter (Signed)
[facility] line [facility]

## 2017-04-10 ENCOUNTER — Encounter (HOSPITAL_COMMUNITY): Payer: Self-pay

## 2017-04-10 ENCOUNTER — Telehealth: Payer: Self-pay

## 2017-04-10 ENCOUNTER — Other Ambulatory Visit: Payer: Self-pay

## 2017-04-10 ENCOUNTER — Observation Stay (HOSPITAL_COMMUNITY)
Admission: AD | Admit: 2017-04-10 | Discharge: 2017-04-11 | Disposition: A | Discharge status: Home / Self Care | Payer: BLUE CROSS/BLUE SHIELD | Source: Ambulatory Visit | Attending: Obstetrics and Gynecology | Admitting: Obstetrics and Gynecology

## 2017-04-10 ENCOUNTER — Inpatient Hospital Stay (HOSPITAL_COMMUNITY): Payer: BLUE CROSS/BLUE SHIELD

## 2017-04-10 DIAGNOSIS — O368390 Maternal care for abnormalities of the fetal heart rate or rhythm, unspecified trimester, not applicable or unspecified: Secondary | ICD-10-CM

## 2017-04-10 DIAGNOSIS — Z79899 Other long term (current) drug therapy: Secondary | ICD-10-CM | POA: Diagnosis not present

## 2017-04-10 DIAGNOSIS — Z362 Encounter for other antenatal screening follow-up: Secondary | ICD-10-CM | POA: Diagnosis not present

## 2017-04-10 DIAGNOSIS — O4100X0 Oligohydramnios, unspecified trimester, not applicable or unspecified: Secondary | ICD-10-CM | POA: Diagnosis present

## 2017-04-10 DIAGNOSIS — O368131 Decreased fetal movements, third trimester, fetus 1: Secondary | ICD-10-CM | POA: Insufficient documentation

## 2017-04-10 DIAGNOSIS — Z3689 Encounter for other specified antenatal screening: Secondary | ICD-10-CM

## 2017-04-10 DIAGNOSIS — Z0371 Encounter for suspected problem with amniotic cavity and membrane ruled out: Secondary | ICD-10-CM | POA: Diagnosis present

## 2017-04-10 DIAGNOSIS — O4103X1 Oligohydramnios, third trimester, fetus 1: Secondary | ICD-10-CM | POA: Diagnosis not present

## 2017-04-10 DIAGNOSIS — O368130 Decreased fetal movements, third trimester, not applicable or unspecified: Secondary | ICD-10-CM | POA: Diagnosis not present

## 2017-04-10 DIAGNOSIS — Z3A36 36 weeks gestation of pregnancy: Secondary | ICD-10-CM | POA: Diagnosis not present

## 2017-04-10 DIAGNOSIS — O283 Abnormal ultrasonic finding on antenatal screening of mother: Secondary | ICD-10-CM | POA: Diagnosis present

## 2017-04-10 DIAGNOSIS — O368190 Decreased fetal movements, unspecified trimester, not applicable or unspecified: Secondary | ICD-10-CM

## 2017-04-10 LAB — CBC
HCT: 33.2 % — ABNORMAL LOW (ref 36.0–46.0)
HEMOGLOBIN: 10.7 g/dL — AB (ref 12.0–15.0)
MCH: 25.7 pg — ABNORMAL LOW (ref 26.0–34.0)
MCHC: 32.2 g/dL (ref 30.0–36.0)
MCV: 79.6 fL (ref 78.0–100.0)
PLATELETS: 189 10*3/uL (ref 150–400)
RBC: 4.17 MIL/uL (ref 3.87–5.11)
RDW: 14.7 % (ref 11.5–15.5)
WBC: 9.2 10*3/uL (ref 4.0–10.5)

## 2017-04-10 LAB — URINALYSIS, ROUTINE W REFLEX MICROSCOPIC
Bilirubin Urine: NEGATIVE
Glucose, UA: NEGATIVE mg/dL
HGB URINE DIPSTICK: NEGATIVE
Ketones, ur: NEGATIVE mg/dL
Leukocytes, UA: NEGATIVE
Nitrite: NEGATIVE
Protein, ur: NEGATIVE mg/dL
SPECIFIC GRAVITY, URINE: 1.013 (ref 1.005–1.030)
pH: 7 (ref 5.0–8.0)

## 2017-04-10 LAB — TYPE AND SCREEN
ABO/RH(D): O POS
ANTIBODY SCREEN: NEGATIVE

## 2017-04-10 LAB — OB RESULTS CONSOLE GBS: STREP GROUP B AG: NEGATIVE

## 2017-04-10 LAB — AMNISURE RUPTURE OF MEMBRANE (ROM) NOT AT ARMC: AMNISURE: NEGATIVE

## 2017-04-10 LAB — POCT FERN TEST: POCT FERN TEST: NEGATIVE

## 2017-04-10 MED ORDER — LACTATED RINGERS IV SOLN
INTRAVENOUS | Status: DC
  Administered 2017-04-10 – 2017-04-11 (×2): via INTRAVENOUS

## 2017-04-10 MED ORDER — DOCUSATE SODIUM 100 MG PO CAPS
100.0000 mg | ORAL_CAPSULE | Freq: Every day | ORAL | Status: DC
  Administered 2017-04-11: 100 mg via ORAL
  Filled 2017-04-10: qty 1

## 2017-04-10 MED ORDER — ACETAMINOPHEN 325 MG PO TABS: 650.0000 mg | ORAL_TABLET | ORAL | Status: DC | PRN

## 2017-04-10 MED ORDER — PRENATAL MULTIVITAMIN CH
1.0000 | ORAL_TABLET | Freq: Every day | ORAL | Status: DC
  Administered 2017-04-11: 1 via ORAL
  Filled 2017-04-10: qty 1

## 2017-04-10 MED ORDER — CALCIUM CARBONATE ANTACID 500 MG PO CHEW: 2.0000 | CHEWABLE_TABLET | ORAL | Status: DC | PRN

## 2017-04-10 NOTE — MAU Note (Signed)
Pt reports increased pelvic pressure and pain. She hads been feleing this since Friday night. Might be braxton hicks but having 5-6 /hr. reported baby has not been moving much today either.Pt stated she has had more wetness in her underwear today. No big gushes of fluid.

## 2017-04-10 NOTE — Telephone Encounter (Signed)
Returned call and pt stated that she is having vaginal discharge and is unsure of the cause. Pt denies discomfort/odor. Pt reports good fetal movement with contractions that come and go. Advised that increased discharge or possible BV can occur during pregnancy, advised that she would be checked at upcoming appt, also advised of warning signs of preterm labor and [facility] changes occur before appt on 04-13-16, pt agreed.

## 2017-04-10 NOTE — MAU Provider Note (Signed)
S: Wendy Gutierrez is a 21 y.o. G2P1001 at 2445w0d  who presents to MAU today complaining of leaking of fluid since. States her underwear has been damp today. Denies gushes of fluid. Denies vaginal bleeding. Endorses irregular contractions & pelvic pressure since Friday. Reports decreased fetal movement earlier today. States normal movement since arriving to MAU.   O: BP 118/65   Pulse 89   Temp 98.9 F (37.2 C) (Oral)   Resp 18   Ht 5\' 6"  (1.676 m)   Wt 228 lb (103.4 kg)   LMP 08/01/2016 (Exact Date)   BMI 36.80 kg/m  GENERAL: Well-developed, well-nourished female in no acute distress.  HEAD: Normocephalic, atraumatic.  CHEST: Normal effort of breathing, regular heart rate ABDOMEN: Soft, nontender, gravid PELVIC: Normal external female genitalia. Vagina is pink and rugated. Cervix with normal contour, no lesions. Normal discharge.  No pooling.   Cervical exam:  Dilation: 1 Effacement (%): 40 Cervical Position: Posterior Station: Ballotable Exam by:: Ginnie Smartachel Schmidt RN      Fetal Monitoring: Baseline: 135 Variability: moderate Accelerations: 15x15 Decelerations: variable decels Contractions: irregular contractions  Results for orders placed or performed during the hospital encounter of 04/10/17 (from the past 24 hour(s))  Urinalysis, Routine w reflex microscopic     Status: None   Collection Time: 04/10/17  4:58 PM  Result Value Ref Range   Color, Urine YELLOW YELLOW   APPearance CLEAR CLEAR   Specific Gravity, Urine 1.013 1.005 - 1.030   pH 7.0 5.0 - 8.0   Glucose, UA NEGATIVE NEGATIVE mg/dL   Hgb urine dipstick NEGATIVE NEGATIVE   Bilirubin Urine NEGATIVE NEGATIVE   Ketones, ur NEGATIVE NEGATIVE mg/dL   Protein, ur NEGATIVE NEGATIVE mg/dL   Nitrite NEGATIVE NEGATIVE   Leukocytes, UA NEGATIVE NEGATIVE  POCT fern test     Status: None   Collection Time: 04/10/17  5:37 PM  Result Value Ref Range   POCT Fern Test Negative = intact amniotic membranes    Fern  negative Reassuring tracing with small variable decel x 1. BPP ordered. BPP 6/8 (off for breathing) & AFI 4.8. C/w Dr. Earlene Plateravis. Will collect amnisure.  Amnisure pending. Care turned over to Dr. Earlene Plateravis.    A: 1. [redacted] weeks gestation of pregnancy   2. Antepartum variable deceleration   3. Decreased fetal movement affecting management of pregnancy in third trimester, single or unspecified fetus      P: Amnisure pending  Judeth HornLawrence, Catriona Dillenbeck, NP 04/10/2017 5:25 PM

## 2017-04-10 NOTE — H&P (Signed)
Obstetric History and Physical  Wendy Gutierrez is a 21 y.o. G2P1001 with IUP at 2035w0d presenting for leaking of fluid, decreased fetal movement and contractions. States only decreased fetal movement today but improved on arrival to [facility]. Also with irregular contractions that she has had that were slightly more painful today, still irregular. Also with leaking of fluid that she has had ongoing for several weeks. Denies bleeding or other complaints. Reports unremarkable pregnancy.    Prenatal Course Source of Care: [place] with onset of care at 15 weeks Pregnancy complications or risks: Patient Active Problem List   Diagnosis Date Noted  . Abnormal fetal ultrasound 04/10/2017  . Candida vaginitis 03/14/2017  . Back pain affecting pregnancy 02/07/2017  . Rubella non-immune status, antepartum 11/21/2016  . Encounter for supervision of other normal pregnancy, unspecified trimester 11/14/2016  . LGSIL on Pap smear of cervix 11/14/2016  . Maternal obesity, antepartum 11/14/2016  . Migraine without aura and without status migrainosus, not intractable 07/24/2014   She plans to bottle feed She desires undecided for postpartum contraception.   Prenatal labs and studies: ABO, Rh: O/Positive/-- (08/06 1627) Antibody: Negative (08/06 1627) Rubella: 0.97 (08/06 1627) RPR: Non Reactive (11/27 1021)  HBsAg: Negative (08/06 1627)  HIV: Non Reactive (11/27 1021)  GBS:  pending 1 hr Glucola  78/135/137 Genetic screening normal Anatomy US normal with limited views of heart  Prenatal Transfer Tool  Maternal Diabetes: No Genetic Screening: Normal Maternal Ultrasounds/Referrals: Normal Fetal Ultrasounds or other Referrals:  Fetal echo due to limited views of heart, normal Maternal Substance Abuse:  No Significant Maternal Medications:  None Significant Maternal Lab Results: None  Past Medical History:  Diagnosis Date  . Headache   . Vitamin D deficiency     Past Surgical History:  Procedure  Laterality Date  . NO PAST SURGERIES      OB History  Gravida Para Term Preterm AB Living  2 1 1     1   SAB TAB Ectopic Multiple Live Births          1    # Outcome Date GA Lbr Len/2nd Weight Sex Delivery Anes PTL Lv  2 Current           1 Term 12/05/11 3124w5d 14:00 / 00:28 6 lb 15.6 oz (3.165 kg) F Vag-Spont EPI  LIV      Social History   Socioeconomic History  . Marital status: Single    Spouse name: None  . Number of children: 0  . Years of education: 4613  . Highest education level: None  Social Needs  . Financial resource strain: None  . Food insecurity - worry: None  . Food insecurity - inability: None  . Transportation needs - medical: None  . Transportation needs - non-medical: None  Occupational History  . Occupation: [employer]  . Occupation: Hostess  Tobacco Use  . Smoking status: Never Smoker  . Smokeless tobacco: Never Used  Substance and Sexual Activity  . Alcohol use: No  . Drug use: No  . Sexual activity: Yes    Partners: Male  Other Topics Concern  . None  Social History Narrative   Lives at home with mother.   Right-handed.   2-3 cups caffeine per day.    Family History  Problem Relation Age of Onset  . Healthy Mother   . Healthy Father   . Breast cancer Maternal Grandmother   . Diabetes Maternal Uncle     Medications Prior to Admission  Medication Sig  Dispense Refill Last Dose  . Elastic Bandages & Supports (COMFORT FIT MATERNITY SUPP MED) MISC Wear daily when ambulating 1 each 0 Taking  . Prenatal Vit-Fe Fumarate-FA (PRENATAL MULTIVITAMIN) TABS tablet Take 1 tablet by mouth daily at 12 noon.   Taking    No Known Allergies  Review of Systems: Negative except for what is mentioned in HPI.  Physical Exam: BP 118/65   Pulse 89   Temp 98.9 F (37.2 C) (Oral)   Resp 18   Ht 5\' 6"  (1.676 m)   Wt 228 lb (103.4 kg)   LMP 08/01/2016 (Exact Date)   BMI 36.80 kg/m  CONSTITUTIONAL: Well-developed, well-nourished female in no acute  distress.  HENT:  Normocephalic, atraumatic, External right and left ear normal. Oropharynx is clear and moist EYES: Conjunctivae and EOM are normal. Pupils are equal, round, and reactive to light. No scleral icterus.  NECK: Normal range of motion, supple, no masses SKIN: Skin is warm and dry. No rash noted. Not diaphoretic. No erythema. No pallor. NEUROLOGIC: Alert and oriented to person, place, and time. Normal reflexes, muscle tone coordination. No cranial nerve deficit noted. PSYCHIATRIC: Normal mood and affect. Normal behavior. Normal judgment and thought content. CARDIOVASCULAR: Normal heart rate noted, regular rhythm RESPIRATORY: Effort and breath sounds normal, no problems with respiration noted ABDOMEN: Soft, nontender, nondistended, gravid. MUSCULOSKELETAL: Normal range of motion. No edema and no tenderness. 2+ distal pulses.  Cervical Exam: Dilatation 1 cm   Effacement 40%   Station ballotable   Presentation: cephalic FHT:  Baseline rate 130 bpm   Variability moderate  Accelerations present   Decelerations none Contractions: irregular   Pertinent Labs/Studies:   Results for orders placed or performed during the hospital encounter of 04/10/17 (from the past 24 hour(s))  Urinalysis, Routine w reflex microscopic     Status: None   Collection Time: 04/10/17  4:58 PM  Result Value Ref Range   Color, Urine YELLOW YELLOW   APPearance CLEAR CLEAR   Specific Gravity, Urine 1.013 1.005 - 1.030   pH 7.0 5.0 - 8.0   Glucose, UA NEGATIVE NEGATIVE mg/dL   Hgb urine dipstick NEGATIVE NEGATIVE   Bilirubin Urine NEGATIVE NEGATIVE   Ketones, ur NEGATIVE NEGATIVE mg/dL   Protein, ur NEGATIVE NEGATIVE mg/dL   Nitrite NEGATIVE NEGATIVE   Leukocytes, UA NEGATIVE NEGATIVE  POCT fern test     Status: None   Collection Time: 04/10/17  5:37 PM  Result Value Ref Range   POCT Fern Test Negative = intact amniotic membranes   Amnisure rupture of membrane (rom)not at [facility]     Status: None    Collection Time: 04/10/17  7:59 PM  Result Value Ref Range   Amnisure ROM NEGATIVE     Assessment : Wendy Gutierrez is a 21 y.o. G2P1001 at [redacted]w[redacted]d being admitted for observation overnight for equivocal fetal testing. With BPP 6/10 (-2 for breathing and AFI 4.8 cm) and negative testing for rupture. Reviewed that with equivocal testing, would not recommend discharge home at this point. Reviewed that with normal MVP but low AFI and complaints for rupture of membranes, amnisure may give false negative. Would recommend she be observed overnight and repeat BPP/amnisure in am, patient verbalizes understanding of this plan and is agreeable. Reviewed that with any worsening of fetal status, would recommend delivery. Reviewed that with NICU census being high, if induction is indicated and neonate needs to be admitted to NICU, baby would likely be stabilized and transferred to another facility. She verbalizes  understanding of this plan.  Plan: Admit to Antepartum for overnight observation Cont EFM/toco IVFs overnight Regular diet Bed rest with bathroom privileges Repeat BPP in am with MFM   K. Therese SarahMeryl Wynette Jersey, M.D. Attending Obstetrician & Gynecologist, [facility] [employer], [facility]  04/10/2017, 9:24 PM

## 2017-04-11 ENCOUNTER — Inpatient Hospital Stay (HOSPITAL_COMMUNITY): Payer: BLUE CROSS/BLUE SHIELD

## 2017-04-11 DIAGNOSIS — Z3A36 36 weeks gestation of pregnancy: Secondary | ICD-10-CM | POA: Diagnosis not present

## 2017-04-11 DIAGNOSIS — O4100X0 Oligohydramnios, unspecified trimester, not applicable or unspecified: Secondary | ICD-10-CM | POA: Diagnosis not present

## 2017-04-11 DIAGNOSIS — O4103X1 Oligohydramnios, third trimester, fetus 1: Secondary | ICD-10-CM | POA: Diagnosis not present

## 2017-04-11 DIAGNOSIS — O368130 Decreased fetal movements, third trimester, not applicable or unspecified: Secondary | ICD-10-CM | POA: Diagnosis not present

## 2017-04-11 LAB — AMNISURE RUPTURE OF MEMBRANE (ROM) NOT AT ARMC: Amnisure ROM: NEGATIVE

## 2017-04-11 NOTE — Discharge Instructions (Signed)
Fetal Movement Counts °Patient Name: ________________________________________________ Patient Due Date: ____________________ °What is a fetal movement count? °A fetal movement count is the number of times that you feel your baby move during a certain amount of time. This may also be called a fetal kick count. A fetal movement count is recommended for every pregnant woman. You may be asked to start counting fetal movements as early as week 28 of your pregnancy. °Pay attention to when your baby is most active. You may notice your baby's sleep and wake cycles. You may also notice things that make your baby move more. You should do a fetal movement count: °· When your baby is normally most active. °· At the same time each day. ° °A good time to count movements is while you are resting, after having something to eat and drink. °How do I count fetal movements? °1. Find a quiet, comfortable area. Sit, or lie down on your side. °2. Write down the date, the start time and stop time, and the number of movements that you felt between those two times. Take this information with you to your health care visits. °3. For 2 hours, count kicks, flutters, swishes, rolls, and jabs. You should feel at least 10 movements during 2 hours. °4. You may stop counting after you have felt 10 movements. °5. If you do not feel 10 movements in 2 hours, have something to eat and drink. Then, keep resting and counting for 1 hour. If you feel at least 4 movements during that hour, you may stop counting. °Contact a health care provider if: °· You feel fewer than 4 movements in 2 hours. °· Your baby is not moving like he or she usually does. ° °This information is not intended to replace advice given to you by your health care provider. Make sure you discuss any questions you have with your health care provider. °Document Released: 04/27/2006 Document Revised: 11/25/2015 Document Reviewed: 05/07/2015 °Elsevier Interactive Patient Education © 2018  Elsevier Inc. ° °

## 2017-04-11 NOTE — Progress Notes (Signed)
To pt room to adjust FHM for pts RN. Pt states that she had a small amount of pink discharge when wiping.  Pts RN was notified of this. Sheryn BisonGordon, Jaleigh Mccroskey Warner

## 2017-04-11 NOTE — Progress Notes (Signed)
All discharge teaching is completed with the patient. All printed discharge instructions given and explained to the patient. Patient verbalizes an understanding of all discharge teaching. No questions or concerns voiced at this time.

## 2017-04-11 NOTE — Discharge Summary (Signed)
Physician Discharge Summary  Patient ID: Wendy Gutierrez MRN: [id number] DOB/AGE: 05/01/1995 21 y.o.  Admit date: 04/10/2017 Discharge date: 04/11/2017  Admission Diagnoses: Oligohydramnios Abnormal US  Discharge Diagnoses:  Reactive Fetal testing  Discharged Condition: good  Hospital Course: Patient presents with decreased FM and leaking fluid. Amnisure was negative, but BPP was 6/10 with oligohydramnios and no breathing. Repeat BPP today shows BPP 10/10 with AFI of 16. Patient being discharged to home.     Consults: None  Significant Diagnostic Studies: above  Treatments: IV hydration  Discharge Exam: Blood pressure 121/60, pulse 85, temperature 98.6 F (37 C), temperature source Oral, resp. rate 18, height 5\' 6"  (1.676 m), weight 228 lb (103.4 kg), last menstrual period 08/01/2016, SpO2 100 %, unknown if currently breastfeeding.  See Dr Jinny Sandersavis's exam from earlier. Exam unchanged.  Disposition: 01-Home or Self Care  Discharge Instructions    Activity as tolerated   Complete by:  As directed    Call MD for:  difficulty breathing, headache or visual disturbances   Complete by:  As directed    Call MD for:  extreme fatigue   Complete by:  As directed    Call MD for:  hives   Complete by:  As directed    Call MD for:  persistant dizziness or light-headedness   Complete by:  As directed    Call MD for:  persistant nausea and vomiting   Complete by:  As directed    Call MD for:  redness, tenderness, or signs of infection (pain, swelling, redness, odor or green/yellow discharge around incision site)   Complete by:  As directed    Call MD for:  severe uncontrolled pain   Complete by:  As directed    Call MD for:  temperature >100.4   Complete by:  As directed    Diet - low sodium heart healthy   Complete by:  As directed      Allergies as of 04/11/2017   No Known Allergies     Medication List    TAKE these medications   COMFORT FIT MATERNITY SUPP MED Misc Wear  daily when ambulating   prenatal multivitamin Tabs tablet Take 1 tablet by mouth daily at 12 noon.      Follow-up Information    [facility] on 04/13/2017.   Specialty:  Obstetrics and Gynecology Contact information: [address], [address] [place] [id number] [phone]           Signed: Levie HeritageJacob J Stinson 04/11/2017, 2:14 PM

## 2017-04-11 NOTE — L&D Delivery Note (Signed)
Delivery Note At 1:49 PM a viable female was delivered via  (Presentation: vertex; OA) over an intact perineum. Head delivered OA. No nuchal cord present. Shoulder and body delivered in usual fashion. Infant placed on mother's abdomen, dried and bulb suctioned. Cord clamped x 2 after 1-minute delay, and cut by family member. Cord blood drawn. After 1 minute, the cord was clamped and cut. 40 units of pitocin diluted in 1000cc LR was infused rapidly IV.  The placenta separated spontaneously and delivered via CCT and maternal pushing effort.  It was inspected and appears to be intact with a 3 VC.     APGAR: 9, 9; weight pending.   Placenta status: intact, sent to L&D.  Cord: 3V without complications.  Cord pH: not sent  Anesthesia: epidural Episiotomy:  n/a Lacerations:  n/a Suture Repair: n/a Est. Blood Loss (mL):  250  Mom to postpartum.  Baby to Couplet care / Skin to Skin.  Wendy Gutierrez 05/02/2017, 2:01 PM

## 2017-04-11 NOTE — Progress Notes (Signed)
FACULTY PRACTICE ANTEPARTUM PROGRESS NOTE  Wendy Gutierrez is a 22 y.o. G2P1001 at [redacted]w[redacted]d who is admitted for equivocal fetal testing.  Estimated Date of Delivery: 05/08/17 Fetal presentation is cephalic.  Length of Stay:  1 Days. Admitted 04/10/2017  Subjective:  Patient reports normal fetal movement.  She denies uterine contractions, denies bleeding, reports some continued leaking of fluid from vagina.  Vitals:  Blood pressure (!) 118/55, pulse 78, temperature 99 F (37.2 C), temperature source Oral, resp. rate 18, height 5\' 6"  (1.676 m), weight 228 lb (103.4 kg), last menstrual period 08/01/2016, SpO2 99 %, unknown if currently breastfeeding. Physical Examination: CONSTITUTIONAL: Well-developed, well-nourished female in no acute distress.  HENT:  Normocephalic, atraumatic, External right and left ear normal. Oropharynx is clear and moist EYES: Conjunctivae and EOM are normal. Pupils are equal, round, and reactive to light. No scleral icterus.  NECK: Normal range of motion, supple, no masses. SKIN: Skin is warm and dry. No rash noted. Not diaphoretic. No erythema. No pallor. NEUROLGIC: Alert and oriented to person, place, and time. Normal reflexes, muscle tone coordination. No cranial nerve deficit noted. PSYCHIATRIC: Normal mood and affect. Normal behavior. Normal judgment and thought content. CARDIOVASCULAR: Normal heart rate noted, regular rhythm RESPIRATORY: Effort and breath sounds normal, no problems with respiration noted MUSCULOSKELETAL: Normal range of motion. No edema and no tenderness. ABDOMEN: Soft, nontender, nondistended, gravid. CERVIX: Dilation: 1 Effacement (%): 40 Cervical Position: Posterior Station: Ballotable Exam by:: Ginnie Smart RN  Fetal monitoring: FHR: 135 bpm, Variability: moderate, Accelerations: Present, Decelerations: Absent  Uterine activity: irregular contractions per hour  Results for orders placed or performed during the hospital encounter of  04/10/17 (from the past 48 hour(s))  Urinalysis, Routine w reflex microscopic     Status: None   Collection Time: 04/10/17  4:58 PM  Result Value Ref Range   Color, Urine YELLOW YELLOW   APPearance CLEAR CLEAR   Specific Gravity, Urine 1.013 1.005 - 1.030   pH 7.0 5.0 - 8.0   Glucose, UA NEGATIVE NEGATIVE mg/dL   Hgb urine dipstick NEGATIVE NEGATIVE   Bilirubin Urine NEGATIVE NEGATIVE   Ketones, ur NEGATIVE NEGATIVE mg/dL   Protein, ur NEGATIVE NEGATIVE mg/dL   Nitrite NEGATIVE NEGATIVE   Leukocytes, UA NEGATIVE NEGATIVE  POCT fern test     Status: None   Collection Time: 04/10/17  5:37 PM  Result Value Ref Range   POCT Fern Test Negative = intact amniotic membranes   Amnisure rupture of membrane (rom)not at [facility]     Status: None   Collection Time: 04/10/17  7:59 PM  Result Value Ref Range   Amnisure ROM NEGATIVE   Type and screen [facility]     Status: None   Collection Time: 04/10/17  9:28 PM  Result Value Ref Range   ABO/RH(D) O POS    Antibody Screen NEG    Sample Expiration 04/13/2017   CBC     Status: Abnormal   Collection Time: 04/10/17  9:34 PM  Result Value Ref Range   WBC 9.2 4.0 - 10.5 K/uL   RBC 4.17 3.87 - 5.11 MIL/uL   Hemoglobin 10.7 (L) 12.0 - 15.0 g/dL   HCT 16.1 (L) 09.6 - 04.5 %   MCV 79.6 78.0 - 100.0 fL   MCH 25.7 (L) 26.0 - 34.0 pg   MCHC 32.2 30.0 - 36.0 g/dL   RDW 40.9 81.1 - 91.4 %   Platelets 189 150 - 400 K/uL    No results  found.  Current scheduled medications . docusate sodium  100 mg Oral Daily  . prenatal multivitamin  1 tablet Oral Q1200    I have reviewed the patient's current medications.  ASSESSMENT: Active Problems:   Abnormal fetal ultrasound   PLAN: Repeat amnisure this am Repeat BPP with MFM this am Reactive NST overnight Cont EFM/toco Regular diet   Continue routine antenatal care.   Baldemar LenisK. Meryl Felicia Bloomquist, M.D. Attending Obstetrician & Gynecologist, [facility] [employer]  [facility], [facility]

## 2017-04-13 ENCOUNTER — Ambulatory Visit (INDEPENDENT_AMBULATORY_CARE_PROVIDER_SITE_OTHER): Payer: BLUE CROSS/BLUE SHIELD | Admitting: Nurse Practitioner

## 2017-04-13 ENCOUNTER — Other Ambulatory Visit: Payer: Self-pay

## 2017-04-13 VITALS — BP 118/73 | HR 96 | Wt 228.7 lb

## 2017-04-13 DIAGNOSIS — O368190 Decreased fetal movements, unspecified trimester, not applicable or unspecified: Secondary | ICD-10-CM

## 2017-04-13 DIAGNOSIS — Z3A36 36 weeks gestation of pregnancy: Secondary | ICD-10-CM

## 2017-04-13 DIAGNOSIS — Z3480 Encounter for supervision of other normal pregnancy, unspecified trimester: Secondary | ICD-10-CM

## 2017-04-13 DIAGNOSIS — Z3483 Encounter for supervision of other normal pregnancy, third trimester: Secondary | ICD-10-CM

## 2017-04-13 LAB — CULTURE, BETA STREP (GROUP B ONLY)

## 2017-04-13 NOTE — Progress Notes (Signed)
    Subjective:  Wendy Gutierrez is a 22 y.o. G2P1001 at 241w3d being seen today for ongoing prenatal care.  She is currently monitored for the following issues for this low-risk pregnancy and has Migraine without aura and without status migrainosus, not intractable; Encounter for supervision of other normal pregnancy, unspecified trimester; LGSIL on Pap smear of cervix; Maternal obesity, antepartum; Rubella non-immune status, antepartum; Back pain affecting pregnancy; Candida vaginitis; [redacted] weeks gestation of pregnancy; and Decreased fetus movements affecting management of mother in third trimester on their problem list.  Patient reports no complaints.  Contractions: Irritability. Vag. Bleeding: None.  Movement: Present. Denies leaking of fluid.   Was admitted at the hospital for one day on 04-10-17.  Was evaluated for leaking of fluid and had BPP of 6/8 that resolved the next day - BPP on 04-11-17 was 8/8 and client was discharged  - notes and US results reviewed.  Reports no further leaking and baby is moving well.  The following portions of the patient's history were reviewed and updated as appropriate: allergies, current medications, past family history, past medical history, past social history, past surgical history and problem list. Problem list updated.  Objective:   Vitals:   04/13/17 0944  BP: 118/73  Pulse: 96  Weight: 228 lb 11.2 oz (103.7 kg)    Fetal Status:     Movement: Present     General:  Alert, oriented and cooperative. Patient is in no acute distress.  Skin: Skin is warm and dry. No rash noted.   Cardiovascular: Normal heart rate noted  Respiratory: Normal respiratory effort, no problems with respiration noted  Abdomen: Soft, gravid, appropriate for gestational age. Pain/Pressure: Present     Pelvic:  Cervical exam deferred        Extremities: Normal range of motion.  Edema: Trace  Mental Status: Normal mood and affect. Normal behavior. Normal judgment and thought content.    Urinalysis:      Assessment and Plan:  Pregnancy: G2P1001 at 242w3d  1. Encounter for supervision of other normal pregnancy, unspecified trimester   Term labor symptoms and general obstetric precautions including but not limited to vaginal bleeding, contractions, leaking of fluid and fetal movement were reviewed in detail with the patient. Please refer to After Visit Summary for other counseling recommendations.  Return in about 1 week (around 04/20/2017).  Nolene BernheimERRI BURLESON, RN, MSN, NP-BC Nurse Practitioner, [facility] [employer], [facility] 04/13/2017 10:03 AM

## 2017-04-13 NOTE — Patient Instructions (Signed)
Braxton Hicks Contractions °Contractions of the uterus can occur throughout pregnancy, but they are not always a sign that you are in labor. You may have practice contractions called Braxton Hicks contractions. These false labor contractions are sometimes confused with true labor. °What are Braxton Hicks contractions? °Braxton Hicks contractions are tightening movements that occur in the muscles of the uterus before labor. Unlike true labor contractions, these contractions do not result in opening (dilation) and thinning of the cervix. Toward the end of pregnancy (32-34 weeks), Braxton Hicks contractions can happen more often and may become stronger. These contractions are sometimes difficult to tell apart from true labor because they can be very uncomfortable. You should not feel embarrassed if you go to the hospital with false labor. °Sometimes, the only way to tell if you are in true labor is for your health care provider to look for changes in the cervix. The health care provider will do a physical exam and may monitor your contractions. If you are not in true labor, the exam should show that your cervix is not dilating and your water has not broken. °If there are other health problems associated with your pregnancy, it is completely safe for you to be sent home with false labor. You may continue to have Braxton Hicks contractions until you go into true labor. °How to tell the difference between true labor and false labor °True labor °· Contractions last 30-70 seconds. °· Contractions become very regular. °· Discomfort is usually felt in the top of the uterus, and it spreads to the lower abdomen and low back. °· Contractions do not go away with walking. °· Contractions usually become more intense and increase in frequency. °· The cervix dilates and gets thinner. °False labor °· Contractions are usually shorter and not as strong as true labor contractions. °· Contractions are usually irregular. °· Contractions  are often felt in the front of the lower abdomen and in the groin. °· Contractions may go away when you walk around or change positions while lying down. °· Contractions get weaker and are shorter-lasting as time goes on. °· The cervix usually does not dilate or become thin. °Follow these instructions at home: °· Take over-the-counter and prescription medicines only as told by your health care provider. °· Keep up with your usual exercises and follow other instructions from your health care provider. °· Eat and drink lightly if you think you are going into labor. °· If Braxton Hicks contractions are making you uncomfortable: °? Change your position from lying down or resting to walking, or change from walking to resting. °? Sit and rest in a tub of warm water. °? Drink enough fluid to keep your urine pale yellow. Dehydration may cause these contractions. °? Do slow and deep breathing several times an hour. °· Keep all follow-up prenatal visits as told by your health care provider. This is important. °Contact a health care provider if: °· You have a fever. °· You have continuous pain in your abdomen. °Get help right away if: °· Your contractions become stronger, more regular, and closer together. °· You have fluid leaking or gushing from your vagina. °· You pass blood-tinged mucus (bloody show). °· You have bleeding from your vagina. °· You have low back pain that you never had before. °· You feel your baby’s head pushing down and causing pelvic pressure. °· Your baby is not moving inside you as much as it used to. °Summary °· Contractions that occur before labor are called Braxton   Hicks contractions, false labor, or practice contractions. °· Braxton Hicks contractions are usually shorter, weaker, farther apart, and less regular than true labor contractions. True labor contractions usually become progressively stronger and regular and they become more frequent. °· Manage discomfort from Braxton Hicks contractions by  changing position, resting in a warm bath, drinking plenty of water, or practicing deep breathing. °This information is not intended to replace advice given to you by your health care provider. Make sure you discuss any questions you have with your health care provider. °Document Released: 08/11/2016 Document Revised: 08/11/2016 Document Reviewed: 08/11/2016 °Elsevier Interactive Patient Education © 2018 Elsevier Inc. ° °

## 2017-04-20 ENCOUNTER — Encounter: Payer: Self-pay | Admitting: Certified Nurse Midwife

## 2017-04-20 ENCOUNTER — Ambulatory Visit (INDEPENDENT_AMBULATORY_CARE_PROVIDER_SITE_OTHER): Payer: BLUE CROSS/BLUE SHIELD | Admitting: Certified Nurse Midwife

## 2017-04-20 VITALS — BP 118/75 | HR 94 | Wt 226.6 lb

## 2017-04-20 DIAGNOSIS — O9989 Other specified diseases and conditions complicating pregnancy, childbirth and the puerperium: Secondary | ICD-10-CM

## 2017-04-20 DIAGNOSIS — Z3483 Encounter for supervision of other normal pregnancy, third trimester: Secondary | ICD-10-CM

## 2017-04-20 DIAGNOSIS — O99213 Obesity complicating pregnancy, third trimester: Secondary | ICD-10-CM

## 2017-04-20 DIAGNOSIS — O09899 Supervision of other high risk pregnancies, unspecified trimester: Secondary | ICD-10-CM

## 2017-04-20 DIAGNOSIS — O99210 Obesity complicating pregnancy, unspecified trimester: Secondary | ICD-10-CM

## 2017-04-20 DIAGNOSIS — Z283 Underimmunization status: Secondary | ICD-10-CM

## 2017-04-20 DIAGNOSIS — Z2839 Other underimmunization status: Secondary | ICD-10-CM

## 2017-04-20 DIAGNOSIS — Z3480 Encounter for supervision of other normal pregnancy, unspecified trimester: Secondary | ICD-10-CM

## 2017-04-20 NOTE — Progress Notes (Signed)
   PRENATAL VISIT NOTE  Subjective:  Wendy Gutierrez is a 22 y.o. G2P1001 at 18w3dbeing seen today for ongoing prenatal care.  She is currently monitored for the following issues for this low-risk pregnancy and has Migraine without aura and without status migrainosus, not intractable; Encounter for supervision of other normal pregnancy, unspecified trimester; LGSIL on Pap smear of cervix; Maternal obesity, antepartum; Rubella non-immune status, antepartum; Back pain affecting pregnancy; Candida vaginitis; [redacted] weeks gestation of pregnancy; and Decreased fetus movements affecting management of mother in third trimester on their problem list.  Patient reports no bleeding, no leaking and occasional contractions.  Contractions: Irregular. Vag. Bleeding: None.  Movement: Present. Denies leaking of fluid.   The following portions of the patient's history were reviewed and updated as appropriate: allergies, current medications, past family history, past medical history, past social history, past surgical history and problem list. Problem list updated.  Objective:   Vitals:   04/20/17 1058  BP: 118/75  Pulse: 94  Weight: 226 lb 9.6 oz (102.8 kg)    Fetal Status: Fetal Heart Rate (bpm): 147; doppler Fundal Height: 38 cm Movement: Present  Presentation: Vertex  General:  Alert, oriented and cooperative. Patient is in no acute distress.  Skin: Skin is warm and dry. No rash noted.   Cardiovascular: Normal heart rate noted  Respiratory: Normal respiratory effort, no problems with respiration noted  Abdomen: Soft, gravid, appropriate for gestational age.  Pain/Pressure: Present     Pelvic: Cervical exam performed Dilation: 1.5 Effacement (%): 30 Station: -3  Extremities: Normal range of motion.  Edema: Trace  Mental Status:  Normal mood and affect. Normal behavior. Normal judgment and thought content.   Assessment and Plan:  Pregnancy: G2P1001 at 361w3d1. Encounter for supervision of other normal  pregnancy, unspecified trimester     Doing well  2. Maternal obesity, antepartum     31 lb weight gain this pregnancy  3. Rubella non-immune status, antepartum     MMR postpartum  Term labor symptoms and general obstetric precautions including but not limited to vaginal bleeding, contractions, leaking of fluid and fetal movement were reviewed in detail with the patient. Please refer to After Visit Summary for other counseling recommendations.  Return in about 1 week (around 04/27/2017) [place]  Ra[place]ne CrockerCNM

## 2017-04-20 NOTE — Patient Instructions (Addendum)
[facility] Before your baby arrives it is important to:  Have all of the supplies that you will need to care for your baby.  Know where to go if there is an emergency.  Discuss the baby's arrival with other family members.  What supplies will I need?  It is recommended that you have the following supplies: Large Items  Crib.  Crib mattress.  Rear-facing infant car seat. If possible, have a trained professional check to make sure that it is installed correctly.  Feeding  6-8 bottles that are 4-5 oz in size.  6-8 nipples.  Bottle brush.  Sterilizer, or a large pan or kettle with a lid.  A way to boil and cool water.  If you will be breastfeeding: ? Breast pump. ? Nipple cream. ? Nursing bra. ? Breast pads. ? Breast shields.  If you will be formula feeding: ? Formula. ? Measuring cups. ? Measuring spoons.  Bathing  Mild baby soap and baby shampoo.  Petroleum jelly.  Soft cloth towel and washcloth.  Hooded towel.  Cotton balls.  Bath basin.  Other Supplies  Rectal thermometer.  Bulb syringe.  Baby wipes or washcloths for diaper changes.  Diaper bag.  Changing pad.  Clothing, including one-piece outfits and pajamas.  Baby nail clippers.  Receiving blankets.  Mattress pad and sheets for the crib.  Night-light for the baby's room.  Baby monitor.  2 or 3 pacifiers.  Either 24-36 cloth diapers and waterproof diaper covers or a box of disposable diapers. You may need to use as many as 10-12 diapers per day.  How do I prepare for an emergency? Prepare for an emergency by:  Knowing how to get to the nearest hospital.  Listing the phone numbers of your baby's health care providers near your home phone and in your cell phone.  How do I prepare my family?  Decide how to handle visitors.  If you have other children: ? Talk with them about the baby coming home. Ask them how they feel about it. ? Read a book together about  being a new big brother or sister. ? Find ways to let them help you prepare for the new baby. ? Have someone ready to care for them while you are in the hospital. This information is not intended to replace advice given to you by your health care provider. Make sure you discuss any questions you have with your health care provider. Document Released: 03/10/2008 Document Revised: 09/03/2015 Document Reviewed: 03/05/2014 Elsevier Interactive Patient Education  2018 [employer]  Third Trimester of Pregnancy The third trimester is from week 29 through week 42, months 7 through 9. This trimester is when your unborn baby (fetus) is growing very fast. At the end of the ninth month, the unborn baby is about 20 inches in length. It weighs about 6-10 pounds. Follow these instructions at home:  Avoid all smoking, herbs, and alcohol. Avoid drugs not approved by your doctor.  Do not use any tobacco products, including cigarettes, chewing tobacco, and electronic cigarettes. If you need help quitting, ask your doctor. You may get counseling or other support to help you quit.  Only take medicine as told by your doctor. Some medicines are safe and some are not during pregnancy.  Exercise only as told by your doctor. Stop exercising if you start having cramps.  Eat regular, healthy meals.  Wear a good support bra if your breasts are tender.  Do not use hot tubs, steam rooms,  or saunas.  Wear your seat belt when driving.  Avoid raw meat, uncooked cheese, and liter boxes and soil used by cats.  Take your prenatal vitamins.  Take 1500-2000 milligrams of calcium daily starting at the 20th week of pregnancy until you deliver your baby.  Try taking medicine that helps you poop (stool softener) as needed, and if your doctor approves. Eat more fiber by eating fresh fruit, vegetables, and whole grains. Drink enough fluids to keep your pee (urine) clear or pale yellow.  Take warm water baths (sitz baths)  to soothe pain or discomfort caused by hemorrhoids. Use hemorrhoid cream if your doctor approves.  If you have puffy, bulging veins (varicose veins), wear support hose. Raise (elevate) your feet for 15 minutes, 3-4 times a day. Limit salt in your diet.  Avoid heavy lifting, wear low heels, and sit up straight.  Rest with your legs raised if you have leg cramps or low back pain.  Visit your dentist if you have not gone during your pregnancy. Use a soft toothbrush to brush your teeth. Be gentle when you floss.  You can have sex (intercourse) unless your doctor tells you not to.  Do not travel far distances unless you must. Only do so with your doctor's approval.  Take prenatal classes.  Practice driving to the hospital.  Pack your hospital bag.  Prepare the baby's room.  Go to your doctor visits. Get help if:  You are not sure if you are in labor or if your water has broken.  You are dizzy.  You have mild cramps or pressure in your lower belly (abdominal).  You have a nagging pain in your belly area.  You continue to feel sick to your stomach (nauseous), throw up (vomit), or have watery poop (diarrhea).  You have bad smelling fluid coming from your vagina.  You have pain with peeing (urination). Get help right away if:  You have a fever.  You are leaking fluid from your vagina.  You are spotting or bleeding from your vagina.  You have severe belly cramping or pain.  You lose or gain weight rapidly.  You have trouble catching your breath and have chest pain.  You notice sudden or extreme puffiness (swelling) of your face, hands, ankles, feet, or legs.  You have not felt the baby move in over an hour.  You have severe headaches that do not go away with medicine.  You have vision changes. This information is not intended to replace advice given to you by your health care provider. Make sure you discuss any questions you have with your health care  provider. Document Released: 06/22/2009 Document Revised: 09/03/2015 Document Reviewed: 05/29/2012 Elsevier Interactive Patient Education  2017 Elsevier Inc.  Ball CorporationBraxton Hicks Contractions Contractions of the uterus can occur throughout pregnancy, but they are not always a sign that you are in labor. You may have practice contractions called Braxton Hicks contractions. These false labor contractions are sometimes confused with true labor. What are Deberah PeltonBraxton Hicks contractions? Braxton Hicks contractions are tightening movements that occur in the muscles of the uterus before labor. Unlike true labor contractions, these contractions do not result in opening (dilation) and thinning of the cervix. Toward the end of pregnancy (32-34 weeks), Braxton Hicks contractions can happen more often and may become stronger. These contractions are sometimes difficult to tell apart from true labor because they can be very uncomfortable. You should not feel embarrassed if you go to the hospital with false labor. Sometimes,  the only way to tell if you are in true labor is for your health care provider to look for changes in the cervix. The health care provider will do a physical exam and may monitor your contractions. If you are not in true labor, the exam should show that your cervix is not dilating and your water has not broken. If there are other health problems associated with your pregnancy, it is completely safe for you to be sent home with false labor. You may continue to have Braxton Hicks contractions until you go into true labor. How to tell the difference between true labor and false labor True labor  Contractions last 30-70 seconds.  Contractions become very regular.  Discomfort is usually felt in the top of the uterus, and it spreads to the lower abdomen and low back.  Contractions do not go away with walking.  Contractions usually become more intense and increase in frequency.  The cervix dilates and  gets thinner. False labor  Contractions are usually shorter and not as strong as true labor contractions.  Contractions are usually irregular.  Contractions are often felt in the front of the lower abdomen and in the groin.  Contractions may go away when you walk around or change positions while lying down.  Contractions get weaker and are shorter-lasting as time goes on.  The cervix usually does not dilate or become thin. Follow these instructions at home:  Take over-the-counter and prescription medicines only as told by your health care provider.  Keep up with your usual exercises and follow other instructions from your health care provider.  Eat and drink lightly if you think you are going into labor.  If Braxton Hicks contractions are making you uncomfortable: ? Change your position from lying down or resting to walking, or change from walking to resting. ? Sit and rest in a tub of warm water. ? Drink enough fluid to keep your urine pale yellow. Dehydration may cause these contractions. ? Do slow and deep breathing several times an hour.  Keep all follow-up prenatal visits as told by your health care provider. This is important. Contact a health care provider if:  You have a fever.  You have continuous pain in your abdomen. Get help right away if:  Your contractions become stronger, more regular, and closer together.  You have fluid leaking or gushing from your vagina.  You pass blood-tinged mucus (bloody show).  You have bleeding from your vagina.  You have low back pain that you never had before.  You feel your baby's head pushing down and causing pelvic pressure.  Your baby is not moving inside you as much as it used to. Summary  Contractions that occur before labor are called Braxton Hicks contractions, false labor, or practice contractions.  Braxton Hicks contractions are usually shorter, weaker, farther apart, and less regular than true labor  contractions. True labor contractions usually become progressively stronger and regular and they become more frequent.  Manage discomfort from [facility] LLCBraxton Hicks contractions by changing position, resting in a warm bath, drinking plenty of water, or practicing deep breathing. This information is not intended to replace advice given to you by your health care provider. Make sure you discuss any questions you have with your health care provider. Document Released: 08/11/2016 Document Revised: 08/11/2016 Document Reviewed: 08/11/2016 Elsevier Interactive Patient Education  2018 [employer]  Contraception Choices Contraception, also called birth control, refers to methods or devices that prevent pregnancy. Hormonal methods Contraceptive implant A contraceptive  implant is a thin, plastic tube that contains a hormone. It is inserted into the upper part of the arm. It can remain in place for up to 3 years. Progestin-only injections Progestin-only injections are injections of progestin, a synthetic form of the hormone progesterone. They are given every 3 months by a health care provider. Birth control pills Birth control pills are pills that contain hormones that prevent pregnancy. They must be taken once a day, preferably at the same time each day. Birth control patch The birth control patch contains hormones that prevent pregnancy. It is placed on the skin and must be changed once a week for three weeks and removed on the fourth week. A prescription is needed to use this method of contraception. Vaginal ring A vaginal ring contains hormones that prevent pregnancy. It is placed in the vagina for three weeks and removed on the fourth week. After that, the process is repeated with a new ring. A prescription is needed to use this method of contraception. Emergency contraceptive Emergency contraceptives prevent pregnancy after unprotected sex. They come in pill form and can be taken up to 5 days after sex.  They work best the sooner they are taken after having sex. Most emergency contraceptives are available without a prescription. This method should not be used as your only form of birth control. Barrier methods Female condom A female condom is a thin sheath that is worn over the penis during sex. Condoms keep sperm from going inside a woman's body. They can be used with a spermicide to increase their effectiveness. They should be disposed after a single use. Female condom A female condom is a soft, loose-fitting sheath that is put into the vagina before sex. The condom keeps sperm from going inside a woman's body. They should be disposed after a single use. Diaphragm A diaphragm is a soft, dome-shaped barrier. It is inserted into the vagina before sex, along with a spermicide. The diaphragm blocks sperm from entering the uterus, and the spermicide kills sperm. A diaphragm should be left in the vagina for 6-8 hours after sex and removed within 24 hours. A diaphragm is prescribed and fitted by a health care provider. A diaphragm should be replaced every 1-2 years, after giving birth, after gaining more than 15 lb (6.8 kg), and after pelvic surgery. Cervical cap A cervical cap is a round, soft latex or plastic cup that fits over the cervix. It is inserted into the vagina before sex, along with spermicide. It blocks sperm from entering the uterus. The cap should be left in place for 6-8 hours after sex and removed within 48 hours. A cervical cap must be prescribed and fitted by a health care provider. It should be replaced every 2 years. Sponge A sponge is a soft, circular piece of polyurethane foam with spermicide on it. The sponge helps block sperm from entering the uterus, and the spermicide kills sperm. To use it, you make it wet and then insert it into the vagina. It should be inserted before sex, left in for at least 6 hours after sex, and removed and thrown away within 30 hours. Spermicides Spermicides  are chemicals that kill or block sperm from entering the cervix and uterus. They can come as a cream, jelly, suppository, foam, or tablet. A spermicide should be inserted into the vagina with an applicator at least 10-15 minutes before sex to allow time for it to work. The process must be repeated every time you have sex. Spermicides do  not require a prescription. Intrauterine contraception Intrauterine device (IUD) An IUD is a T-shaped device that is put in a woman's uterus. There are two types:  Hormone IUD.This type contains progestin, a synthetic form of the hormone progesterone. This type can stay in place for 3-5 years.  Copper IUD.This type is wrapped in copper wire. It can stay in place for 10 years.  Permanent methods of contraception Female tubal ligation In this method, a woman's fallopian tubes are sealed, tied, or blocked during surgery to prevent eggs from traveling to the uterus. Hysteroscopic sterilization In this method, a small, flexible insert is placed into each fallopian tube. The inserts cause scar tissue to form in the fallopian tubes and block them, so sperm cannot reach an egg. The procedure takes about 3 months to be effective. Another form of birth control must be used during those 3 months. Female sterilization This is a procedure to tie off the tubes that carry sperm (vasectomy). After the procedure, the man can still ejaculate fluid (semen). Natural planning methods Natural family planning In this method, a couple does not have sex on days when the woman could become pregnant. Calendar method This means keeping track of the length of each menstrual cycle, identifying the days when pregnancy can happen, and not having sex on those days. Ovulation method In this method, a couple avoids sex during ovulation. Symptothermal method This method involves not having sex during ovulation. The woman typically checks for ovulation by watching changes in her temperature and in  the consistency of cervical mucus. Post-ovulation method In this method, a couple waits to have sex until after ovulation. Summary  Contraception, also called birth control, means methods or devices that prevent pregnancy.  Hormonal methods of contraception include implants, injections, pills, patches, vaginal rings, and emergency contraceptives.  Barrier methods of contraception can include female condoms, female condoms, diaphragms, cervical caps, sponges, and spermicides.  There are two types of IUDs (intrauterine devices). An IUD can be put in a woman's uterus to prevent pregnancy for 3-5 years.  Permanent sterilization can be done through a procedure for males, females, or both.  Natural family planning methods involve not having sex on days when the woman could become pregnant. This information is not intended to replace advice given to you by your health care provider. Make sure you discuss any questions you have with your health care provider. Document Released: 03/28/2005 Document Revised: 04/30/2016 Document Reviewed: 04/30/2016 Elsevier Interactive Patient Education  2018 [employer].

## 2017-04-24 ENCOUNTER — Encounter (HOSPITAL_COMMUNITY): Payer: Self-pay | Admitting: Emergency Medicine

## 2017-04-24 ENCOUNTER — Inpatient Hospital Stay (HOSPITAL_COMMUNITY)
Admission: AD | Admit: 2017-04-24 | Discharge: 2017-04-24 | Disposition: A | Discharge status: Home / Self Care | Payer: BLUE CROSS/BLUE SHIELD | Source: Ambulatory Visit | Attending: Family Medicine | Admitting: Family Medicine

## 2017-04-24 DIAGNOSIS — O26893 Other specified pregnancy related conditions, third trimester: Secondary | ICD-10-CM

## 2017-04-24 DIAGNOSIS — O368190 Decreased fetal movements, unspecified trimester, not applicable or unspecified: Secondary | ICD-10-CM | POA: Diagnosis present

## 2017-04-24 DIAGNOSIS — N898 Other specified noninflammatory disorders of vagina: Secondary | ICD-10-CM

## 2017-04-24 DIAGNOSIS — O368130 Decreased fetal movements, third trimester, not applicable or unspecified: Secondary | ICD-10-CM | POA: Insufficient documentation

## 2017-04-24 DIAGNOSIS — Z3A38 38 weeks gestation of pregnancy: Secondary | ICD-10-CM

## 2017-04-24 DIAGNOSIS — Z3689 Encounter for other specified antenatal screening: Secondary | ICD-10-CM

## 2017-04-24 LAB — AMNISURE RUPTURE OF MEMBRANE (ROM) NOT AT ARMC: AMNISURE: NEGATIVE

## 2017-04-24 NOTE — MAU Provider Note (Signed)
History     CSN: [id number]  Arrival date and time: 04/24/17 2056   First Provider Initiated Contact with Patient 04/24/17 2134      Chief Complaint  Patient presents with  . Rupture of Membranes   HPI  Ms.  Wendy Gutierrez is a 22 y.o. year old G72P1001 female at [redacted]w[redacted]d weeks gestation who presents to MAU reporting LOF off and on all day and decreased FM. She denies VB or pain.   Past Medical History:  Diagnosis Date  . Headache   . Vitamin D deficiency     Past Surgical History:  Procedure Laterality Date  . NO PAST SURGERIES      Family History  Problem Relation Age of Onset  . Healthy Mother   . Healthy Father   . Breast cancer Maternal Grandmother   . Diabetes Maternal Uncle     Social History   Tobacco Use  . Smoking status: Never Smoker  . Smokeless tobacco: Never Used  Substance Use Topics  . Alcohol use: No  . Drug use: No    Allergies: No Known Allergies  Medications Prior to Admission  Medication Sig Dispense Refill Last Dose  . Elastic Bandages & Supports (COMFORT FIT MATERNITY SUPP MED) MISC Wear daily when ambulating 1 each 0 Taking  . Prenatal Vit-Fe Fumarate-FA (PRENATAL MULTIVITAMIN) TABS tablet Take 1 tablet by mouth daily at 12 noon.   Taking    Review of Systems  Constitutional: Negative.   HENT: Negative.   Eyes: Negative.   Respiratory: Negative.   Cardiovascular: Negative.   Gastrointestinal: Positive for abdominal pain.  Endocrine: Negative.   Genitourinary: Positive for vaginal discharge ("Leaking fluid off and on all day").       DFM  Musculoskeletal: Negative.   Skin: Negative.   Allergic/Immunologic: Negative.   Neurological: Negative.   Hematological: Negative.   Psychiatric/Behavioral: Negative.    Physical Exam   Blood pressure (!) 116/56, pulse 92, temperature 98.6 F (37 C), temperature source Oral, resp. rate 17, height 5\' 5"  (1.651 m), weight 231 lb 12 oz (105.1 kg), last menstrual period 08/01/2016, SpO2 100  %.  Physical Exam  Nursing note and vitals reviewed. Constitutional: She is oriented to person, place, and time. She appears well-developed and well-nourished.  HENT:  Head: Normocephalic.  Eyes: Pupils are equal, round, and reactive to light.  Neck: Normal range of motion.  Cardiovascular: Normal rate and normal heart sounds.  Respiratory: Effort normal.  GI: Soft.  Genitourinary:  Genitourinary Comments: Uterus: gravid, S=D, cx: smooth, pink, no lesions, small amt of clear, white vaginal d/c running down perineum, VE deferred until Amnisure results - RN to do VE  Musculoskeletal: Normal range of motion.  Neurological: She is alert and oriented to person, place, and time.  Skin: Skin is warm and dry.  Psychiatric: She has a normal mood and affect. Her behavior is normal. Judgment and thought content normal.  Dilation: 1.5 Effacement (%): 30 Cervical Position: Posterior Station: -3 Presentation: Vertex Exam by:: Wendy hardy, rn    MAU Course  Procedures  MDM NST - FHR: 140 bpm / moderate variability / accels present / decels absent / TOCO: regular every 7-10 mins  Fern -- negative Amnisure  Results for orders placed or performed during the hospital encounter of 04/24/17 (from the past 24 hour(s))  Amnisure rupture of membrane (rom)not at [facility]     Status: None   Collection Time: 04/24/17  8:56 PM  Result Value Ref Range  Amnisure ROM NEGATIVE    Assessment and Plan  Vaginal discharge during pregnancy in third trimester - Labor precautions given - [facility] - [facility] - Discharge home - Patient verbalized an understanding of the plan of care and agrees.   Raelyn Moraolitta Jacquese Cassarino, MSN, CNM 04/24/2017, 9:34 PM

## 2017-04-24 NOTE — MAU Note (Signed)
Pt/ c/o LOF  Here and there all day.  Denies bleeding C/o decrease FM  Denies pain at this time.

## 2017-04-27 ENCOUNTER — Ambulatory Visit (INDEPENDENT_AMBULATORY_CARE_PROVIDER_SITE_OTHER): Payer: BLUE CROSS/BLUE SHIELD | Admitting: Certified Nurse Midwife

## 2017-04-27 ENCOUNTER — Encounter: Payer: Self-pay | Admitting: Certified Nurse Midwife

## 2017-04-27 VITALS — BP 139/75 | HR 99 | Wt 228.2 lb

## 2017-04-27 DIAGNOSIS — O99210 Obesity complicating pregnancy, unspecified trimester: Secondary | ICD-10-CM

## 2017-04-27 DIAGNOSIS — O09899 Supervision of other high risk pregnancies, unspecified trimester: Secondary | ICD-10-CM

## 2017-04-27 DIAGNOSIS — Z3480 Encounter for supervision of other normal pregnancy, unspecified trimester: Secondary | ICD-10-CM

## 2017-04-27 DIAGNOSIS — O99213 Obesity complicating pregnancy, third trimester: Secondary | ICD-10-CM

## 2017-04-27 DIAGNOSIS — O9989 Other specified diseases and conditions complicating pregnancy, childbirth and the puerperium: Secondary | ICD-10-CM

## 2017-04-27 DIAGNOSIS — Z283 Underimmunization status: Secondary | ICD-10-CM

## 2017-04-27 DIAGNOSIS — Z3483 Encounter for supervision of other normal pregnancy, third trimester: Secondary | ICD-10-CM

## 2017-04-27 DIAGNOSIS — Z2839 Other underimmunization status: Secondary | ICD-10-CM

## 2017-04-27 NOTE — Progress Notes (Signed)
Pt denies concerns at this time. 

## 2017-04-27 NOTE — Progress Notes (Signed)
   PRENATAL VISIT NOTE  Subjective:  Wendy Gutierrez is a 22 y.o. G2P1001 at 64w3dbeing seen today for ongoing prenatal care.  She is currently monitored for the following issues for this low-risk pregnancy and has Migraine without aura and without status migrainosus, not intractable; Encounter for supervision of other normal pregnancy, unspecified trimester; LGSIL on Pap smear of cervix; Maternal obesity, antepartum; Rubella non-immune status, antepartum; Back pain affecting pregnancy; Candida vaginitis; Decreased fetal movement; and Vaginal discharge during pregnancy in third trimester on their problem list.  Patient reports no bleeding, no leaking and occasional contractions.  Contractions: Irregular. Vag. Bleeding: None.  Movement: Present. Denies leaking of fluid.   The following portions of the patient's history were reviewed and updated as appropriate: allergies, current medications, past family history, past medical history, past social history, past surgical history and problem list. Problem list updated.  Objective:   Vitals:   04/27/17 1103  BP: 139/75  Pulse: 99  Weight: 228 lb 3.2 oz (103.5 kg)    Fetal Status: Fetal Heart Rate (bpm): 153; doppler Fundal Height: 40 cm Movement: Present  Presentation: Vertex  General:  Alert, oriented and cooperative. Patient is in no acute distress.  Skin: Skin is warm and dry. No rash noted.   Cardiovascular: Normal heart rate noted  Respiratory: Normal respiratory effort, no problems with respiration noted  Abdomen: Soft, gravid, appropriate for gestational age.  Pain/Pressure: Present     Pelvic: Cervical exam performed Dilation: 1.5 Effacement (%): 50 Station: -2  Extremities: Normal range of motion.  Edema: Trace  Mental Status:  Normal mood and affect. Normal behavior. Normal judgment and thought content.   Assessment and Plan:  Pregnancy: G2P1001 at 357w3d1. Encounter for supervision of other normal pregnancy, unspecified  trimester     Doing well.   2. Maternal obesity, antepartum      33 lb weight gain this pregnancy  3. Rubella non-immune status, antepartum     MMR postpartum  Term labor symptoms and general obstetric precautions including but not limited to vaginal bleeding, contractions, leaking of fluid and fetal movement were reviewed in detail with the patient. Please refer to After Visit Summary for other counseling recommendations.  Return in about 1 week (around 05/04/2017) for ROB, schedule IOL for 41 weeks.   RaMorene CrockerCNM

## 2017-05-01 ENCOUNTER — Encounter (HOSPITAL_COMMUNITY): Payer: Self-pay

## 2017-05-01 ENCOUNTER — Inpatient Hospital Stay (HOSPITAL_COMMUNITY)
Admission: AD | Admit: 2017-05-01 | Discharge: 2017-05-04 | DRG: 807 | Disposition: A | Discharge status: Home / Self Care | Payer: BLUE CROSS/BLUE SHIELD | Source: Ambulatory Visit | Attending: Family Medicine | Admitting: Family Medicine

## 2017-05-01 ENCOUNTER — Telehealth: Payer: Self-pay

## 2017-05-01 DIAGNOSIS — O9902 Anemia complicating childbirth: Secondary | ICD-10-CM | POA: Diagnosis present

## 2017-05-01 DIAGNOSIS — E669 Obesity, unspecified: Secondary | ICD-10-CM | POA: Diagnosis present

## 2017-05-01 DIAGNOSIS — Z23 Encounter for immunization: Secondary | ICD-10-CM

## 2017-05-01 DIAGNOSIS — O4290 Premature rupture of membranes, unspecified as to length of time between rupture and onset of labor, unspecified weeks of gestation: Secondary | ICD-10-CM

## 2017-05-01 DIAGNOSIS — Z3480 Encounter for supervision of other normal pregnancy, unspecified trimester: Secondary | ICD-10-CM

## 2017-05-01 DIAGNOSIS — Z3A39 39 weeks gestation of pregnancy: Secondary | ICD-10-CM | POA: Diagnosis not present

## 2017-05-01 DIAGNOSIS — R87612 Low grade squamous intraepithelial lesion on cytologic smear of cervix (LGSIL): Secondary | ICD-10-CM | POA: Diagnosis present

## 2017-05-01 DIAGNOSIS — O99210 Obesity complicating pregnancy, unspecified trimester: Secondary | ICD-10-CM | POA: Diagnosis present

## 2017-05-01 DIAGNOSIS — O4292 Full-term premature rupture of membranes, unspecified as to length of time between rupture and onset of labor: Principal | ICD-10-CM | POA: Diagnosis present

## 2017-05-01 DIAGNOSIS — O99214 Obesity complicating childbirth: Secondary | ICD-10-CM | POA: Diagnosis present

## 2017-05-01 DIAGNOSIS — D649 Anemia, unspecified: Secondary | ICD-10-CM | POA: Diagnosis present

## 2017-05-01 DIAGNOSIS — Z3490 Encounter for supervision of normal pregnancy, unspecified, unspecified trimester: Secondary | ICD-10-CM

## 2017-05-01 LAB — CBC
HCT: 33.5 % — ABNORMAL LOW (ref 36.0–46.0)
HEMOGLOBIN: 10.7 g/dL — AB (ref 12.0–15.0)
MCH: 25.4 pg — ABNORMAL LOW (ref 26.0–34.0)
MCHC: 31.9 g/dL (ref 30.0–36.0)
MCV: 79.4 fL (ref 78.0–100.0)
PLATELETS: 186 10*3/uL (ref 150–400)
RBC: 4.22 MIL/uL (ref 3.87–5.11)
RDW: 15.2 % (ref 11.5–15.5)
WBC: 7.6 10*3/uL (ref 4.0–10.5)

## 2017-05-01 LAB — TYPE AND SCREEN
ABO/RH(D): O POS
Antibody Screen: NEGATIVE

## 2017-05-01 LAB — POCT FERN TEST: POCT FERN TEST: POSITIVE

## 2017-05-01 MED ORDER — LACTATED RINGERS IV SOLN: 500.0000 mL | INTRAVENOUS | Status: DC | PRN

## 2017-05-01 MED ORDER — SOD CITRATE-CITRIC ACID 500-334 MG/5ML PO SOLN: 30.0000 mL | ORAL | Status: DC | PRN

## 2017-05-01 MED ORDER — OXYTOCIN 40 UNITS IN LACTATED RINGERS INFUSION - SIMPLE MED
2.5000 [IU]/h | INTRAVENOUS | Status: DC
  Administered 2017-05-02: 2.5 [IU]/h via INTRAVENOUS

## 2017-05-01 MED ORDER — OXYTOCIN 40 UNITS IN LACTATED RINGERS INFUSION - SIMPLE MED
1.0000 m[IU]/min | INTRAVENOUS | Status: DC
  Administered 2017-05-01: 2 m[IU]/min via INTRAVENOUS
  Filled 2017-05-01: qty 1000

## 2017-05-01 MED ORDER — OXYCODONE-ACETAMINOPHEN 5-325 MG PO TABS: 1.0000 | ORAL_TABLET | ORAL | Status: DC | PRN

## 2017-05-01 MED ORDER — OXYCODONE-ACETAMINOPHEN 5-325 MG PO TABS: 2.0000 | ORAL_TABLET | ORAL | Status: DC | PRN

## 2017-05-01 MED ORDER — LIDOCAINE HCL (PF) 1 % IJ SOLN
30.0000 mL | INTRAMUSCULAR | Status: DC | PRN
  Filled 2017-05-01: qty 30

## 2017-05-01 MED ORDER — ONDANSETRON HCL 4 MG/2ML IJ SOLN: 4.0000 mg | Freq: Four times a day (QID) | INTRAMUSCULAR | Status: DC | PRN

## 2017-05-01 MED ORDER — FLEET ENEMA 7-19 GM/118ML RE ENEM: 1.0000 | ENEMA | RECTAL | Status: DC | PRN

## 2017-05-01 MED ORDER — TERBUTALINE SULFATE 1 MG/ML IJ SOLN
0.2500 mg | Freq: Once | INTRAMUSCULAR | Status: DC | PRN
  Filled 2017-05-01: qty 1

## 2017-05-01 MED ORDER — LACTATED RINGERS IV SOLN
INTRAVENOUS | Status: DC
  Administered 2017-05-01 – 2017-05-02 (×3): via INTRAVENOUS

## 2017-05-01 MED ORDER — OXYTOCIN BOLUS FROM INFUSION
500.0000 mL | Freq: Once | INTRAVENOUS | Status: AC
  Administered 2017-05-02: 500 mL via INTRAVENOUS

## 2017-05-01 MED ORDER — ACETAMINOPHEN 325 MG PO TABS: 650.0000 mg | ORAL_TABLET | ORAL | Status: DC | PRN

## 2017-05-01 NOTE — Telephone Encounter (Signed)
TC from pt c/o wet panties after hearing a "gushing" sound. This started over the weekend and has been on and off today. She notices leaking fluid even after completely emptying her bladder. She is also experiencing  intermittent cramping, denies VB, has +FM. Pt to report to MAU to r/o LOF.

## 2017-05-01 NOTE — MAU Note (Signed)
Fetal presentation confirmed vertex via bedside US.

## 2017-05-01 NOTE — MAU Note (Signed)
Pt reports leaking clear/yellow fluid last night. States she heard a "pop" when she sat up and states underwear felt moist. States she put on a panty liner and has felt it all day. States she changes panty liner every time she uses the bathroom. States she feels it more when she stands. Pt denies itching or odor. Pt denies vaginal bleeding. Pt reports contractions irregularly. Reports good fetal movement.

## 2017-05-01 NOTE — H&P (Signed)
LABOR AND DELIVERY ADMISSION HISTORY AND PHYSICAL NOTE  Wendy Gutierrez is a 22 y.o. female G2P1001 with IUP at [redacted]w[redacted]d by LMP and [place] presenting for PROM. Patient states she noticed leakage of fluid beginning Saturday morning but had a gush today. Patient had positive Fern test in MAU this evening.  She reports positive fetal movement. She denies vaginal bleeding.  Prenatal History/Complications: PNC at [facility] Pregnancy complications:  - migraine without aura  - LGSIL on pap smear  - maternal obesity  - rubella non immune   Sono: @[redacted]w[redacted]d , normal anatomy, cephalic presentation, placenta anterior above cervical os, 2683g, 50% EFW  Past Medical History: Past Medical History:  Diagnosis Date  . Headache   . Vitamin D deficiency     Past Surgical History: Past Surgical History:  Procedure Laterality Date  . NO PAST SURGERIES      Obstetrical History: OB History    Gravida Para Term Preterm AB Living   2 1 1     1    SAB TAB Ectopic Multiple Live Births           1      Social History: Social History   Socioeconomic History  . Marital status: Single    Spouse name: None  . Number of children: 0  . Years of education: 77  . Highest education level: None  Social Needs  . Financial resource strain: None  . Food insecurity - worry: None  . Food insecurity - inability: None  . Transportation needs - medical: None  . Transportation needs - non-medical: None  Occupational History  . Occupation: Brewing technologist  . Occupation: Hostess  Tobacco Use  . Smoking status: Never Smoker  . Smokeless tobacco: Never Used  Substance and Sexual Activity  . Alcohol use: No  . Drug use: No  . Sexual activity: Yes    Partners: Male  Other Topics Concern  . None  Social History Narrative   Lives at home with mother.   Right-handed.   2-3 cups caffeine per day.    Family History: Family History  Problem Relation Age of Onset  . Healthy Mother   . Healthy Father   . Breast cancer  Maternal Grandmother   . Diabetes Maternal Uncle     Allergies: No Known Allergies  Medications Prior to Admission  Medication Sig Dispense Refill Last Dose  . Prenatal Vit-Fe Fumarate-FA (PRENATAL MULTIVITAMIN) TABS tablet Take 1 tablet by mouth daily at 12 noon.   05/01/2017 at Unknown time  . Elastic Bandages & Supports (COMFORT FIT MATERNITY SUPP MED) MISC Wear daily when ambulating 1 each 0 Taking     Review of Systems  All systems reviewed and negative except as stated in HPI  Physical Exam Blood pressure 135/72, pulse 80, temperature 98.5 F (36.9 C), temperature source Oral, resp. rate 16, height 5\' 5"  (1.651 m), weight 105.7 kg (233 lb), last menstrual period 08/01/2016, SpO2 99 %, unknown if currently breastfeeding. General appearance: alert, oriented, NAD Lungs: normal respiratory effort Heart: regular rate Abdomen: soft, non-tender; gravid, FH appropriate for GA Extremities: No calf swelling or tenderness Presentation: cephalic, confirmed by [place] in MAU Fetal monitoring: FHR 150, moderate variability, +accel, - decel Uterine activity: irregular, every 2-6 min  Dilation: 2.5 Effacement (%): 50 Station: -3 Exam by:: Abrham DO   Prenatal labs: ABO, Rh: --/--/O POS (01/21 2020) Antibody: NEG (01/21 2020) Rubella: 0.97 (08/06 1627) RPR: Non Reactive (11/27 1021)  HBsAg: Negative (08/06 1627)  HIV: Non Reactive (11/27  1021)  GC/Chlamydia: negative on 02/21/17 GBS: Negative (12/31 0000)  2-hr GTT: 78/135/137 - normal Genetic screening:  Quad screen neg Anatomy US: normal   Prenatal Transfer Tool  Maternal Diabetes: No Genetic Screening: Normal Maternal Ultrasounds/Referrals: Normal Fetal Ultrasounds or other Referrals:  None Maternal Substance Abuse:  No Significant Maternal Medications:  None Significant Maternal Lab Results: None  Results for orders placed or performed during the hospital encounter of 05/01/17 (from the past 24 hour(s))  POCT fern test    Collection Time: 05/01/17  7:52 PM  Result Value Ref Range   POCT Fern Test Positive = ruptured amniotic membanes   CBC   Collection Time: 05/01/17  8:20 PM  Result Value Ref Range   WBC 7.6 4.0 - 10.5 K/uL   RBC 4.22 3.87 - 5.11 MIL/uL   Hemoglobin 10.7 (L) 12.0 - 15.0 g/dL   HCT 14.733.5 (L) 82.936.0 - 56.246.0 %   MCV 79.4 78.0 - 100.0 fL   MCH 25.4 (L) 26.0 - 34.0 pg   MCHC 31.9 30.0 - 36.0 g/dL   RDW 13.015.2 86.511.5 - 78.415.5 %   Platelets 186 150 - 400 K/uL  Type and screen [facility]   Collection Time: 05/01/17  8:20 PM  Result Value Ref Range   ABO/RH(D) O POS    Antibody Screen NEG    Sample Expiration 05/04/2017     Patient Active Problem List   Diagnosis Date Noted  . PROM (premature rupture of membranes) 05/01/2017  . Vaginal discharge during pregnancy in third trimester 04/24/2017  . Decreased fetal movement   . Candida vaginitis 03/14/2017  . Back pain affecting pregnancy 02/07/2017  . Rubella non-immune status, antepartum 11/21/2016  . Encounter for supervision of other normal pregnancy, unspecified trimester 11/14/2016  . LGSIL on Pap smear of cervix 11/14/2016  . Maternal obesity, antepartum 11/14/2016  . Migraine without aura and without status migrainosus, not intractable 07/24/2014    Assessment: Wendy Gutierrez is a 22 y.o. G2P1001 at 3839w0d here for PROM. Early labor.   #Labor: will start Pitocin to augment labor. Can increase as needed to have adequate contractions  #Pain: Per patient request, would like epidural  #FWB: Category I  #ID: GBS neg #MOF: bottle #MOC:OCPs  #Circ:  Outpatient   Oralia ManisSherin Abraham, DO 05/01/2017, 10:08 PM  OB FELLOW HISTORY AND PHYSICAL ATTESTATION  I confirm that I have verified the information documented in the resident's note and that I have also personally reperformed the physical exam and all medical decision making activities. I agree with above documentation and have made edits as needed.   Caryl AdaJazma Hilding Quintanar, DO OB  Fellow 05/02/2017, 8:38 AM

## 2017-05-02 ENCOUNTER — Observation Stay (HOSPITAL_COMMUNITY): Payer: BLUE CROSS/BLUE SHIELD | Admitting: Anesthesiology

## 2017-05-02 ENCOUNTER — Other Ambulatory Visit: Payer: Self-pay

## 2017-05-02 ENCOUNTER — Encounter (HOSPITAL_COMMUNITY): Payer: Self-pay

## 2017-05-02 DIAGNOSIS — Z3A39 39 weeks gestation of pregnancy: Secondary | ICD-10-CM

## 2017-05-02 DIAGNOSIS — Z3490 Encounter for supervision of normal pregnancy, unspecified, unspecified trimester: Secondary | ICD-10-CM

## 2017-05-02 LAB — RPR: RPR Ser Ql: NONREACTIVE

## 2017-05-02 MED ORDER — SENNOSIDES-DOCUSATE SODIUM 8.6-50 MG PO TABS
2.0000 | ORAL_TABLET | ORAL | Status: DC
  Administered 2017-05-03: 2 via ORAL
  Filled 2017-05-02: qty 2

## 2017-05-02 MED ORDER — SIMETHICONE 80 MG PO CHEW: 80.0000 mg | CHEWABLE_TABLET | ORAL | Status: DC | PRN

## 2017-05-02 MED ORDER — FENTANYL 2.5 MCG/ML BUPIVACAINE 1/10 % EPIDURAL INFUSION (WH - ANES)
14.0000 mL/h | INTRAMUSCULAR | Status: DC | PRN
  Administered 2017-05-02 (×2): 14 mL/h via EPIDURAL
  Filled 2017-05-02 (×2): qty 100

## 2017-05-02 MED ORDER — OXYCODONE HCL 5 MG PO TABS: 5.0000 mg | ORAL_TABLET | ORAL | Status: DC | PRN

## 2017-05-02 MED ORDER — PHENYLEPHRINE 40 MCG/ML (10ML) SYRINGE FOR IV PUSH (FOR BLOOD PRESSURE SUPPORT)
80.0000 ug | PREFILLED_SYRINGE | INTRAVENOUS | Status: DC | PRN
  Filled 2017-05-02: qty 5
  Filled 2017-05-02: qty 10

## 2017-05-02 MED ORDER — OXYCODONE HCL 5 MG PO TABS: 10.0000 mg | ORAL_TABLET | ORAL | Status: DC | PRN

## 2017-05-02 MED ORDER — BENZOCAINE-MENTHOL 20-0.5 % EX AERO: 1.0000 "application " | INHALATION_SPRAY | CUTANEOUS | Status: DC | PRN

## 2017-05-02 MED ORDER — DIPHENHYDRAMINE HCL 25 MG PO CAPS: 25.0000 mg | ORAL_CAPSULE | Freq: Four times a day (QID) | ORAL | Status: DC | PRN

## 2017-05-02 MED ORDER — HYDROXYZINE HCL 25 MG PO TABS
25.0000 mg | ORAL_TABLET | Freq: Three times a day (TID) | ORAL | Status: DC | PRN
Start: 2017-05-02 — End: 2017-05-02
  Filled 2017-05-02: qty 1

## 2017-05-02 MED ORDER — DIPHENHYDRAMINE HCL 50 MG/ML IJ SOLN
12.5000 mg | INTRAMUSCULAR | Status: DC | PRN
  Administered 2017-05-02 (×2): 12.5 mg via INTRAVENOUS
  Filled 2017-05-02: qty 1

## 2017-05-02 MED ORDER — ACETAMINOPHEN 325 MG PO TABS: 650.0000 mg | ORAL_TABLET | ORAL | Status: DC | PRN

## 2017-05-02 MED ORDER — PHENYLEPHRINE 40 MCG/ML (10ML) SYRINGE FOR IV PUSH (FOR BLOOD PRESSURE SUPPORT)
80.0000 ug | PREFILLED_SYRINGE | INTRAVENOUS | Status: DC | PRN
  Filled 2017-05-02: qty 5

## 2017-05-02 MED ORDER — LACTATED RINGERS IV SOLN
500.0000 mL | Freq: Once | INTRAVENOUS | Status: AC
  Administered 2017-05-02: 500 mL via INTRAVENOUS

## 2017-05-02 MED ORDER — DIBUCAINE 1 % RE OINT: 1.0000 "application " | TOPICAL_OINTMENT | RECTAL | Status: DC | PRN

## 2017-05-02 MED ORDER — PRENATAL MULTIVITAMIN CH
1.0000 | ORAL_TABLET | Freq: Every day | ORAL | Status: DC
  Administered 2017-05-03 – 2017-05-04 (×2): 1 via ORAL
  Filled 2017-05-02 (×2): qty 1

## 2017-05-02 MED ORDER — EPHEDRINE 5 MG/ML INJ
10.0000 mg | INTRAVENOUS | Status: DC | PRN
  Filled 2017-05-02: qty 2

## 2017-05-02 MED ORDER — ZOLPIDEM TARTRATE 5 MG PO TABS: 5.0000 mg | ORAL_TABLET | Freq: Every evening | ORAL | Status: DC | PRN

## 2017-05-02 MED ORDER — TETANUS-DIPHTH-ACELL PERTUSSIS 5-2.5-18.5 LF-MCG/0.5 IM SUSP: 0.5000 mL | Freq: Once | INTRAMUSCULAR | Status: DC

## 2017-05-02 MED ORDER — IBUPROFEN 600 MG PO TABS
600.0000 mg | ORAL_TABLET | Freq: Four times a day (QID) | ORAL | Status: DC
  Administered 2017-05-02 – 2017-05-04 (×7): 600 mg via ORAL
  Filled 2017-05-02 (×7): qty 1

## 2017-05-02 MED ORDER — LIDOCAINE HCL (PF) 1 % IJ SOLN
INTRAMUSCULAR | Status: DC | PRN
  Administered 2017-05-02 (×2): 4 mL via EPIDURAL

## 2017-05-02 MED ORDER — ONDANSETRON HCL 4 MG PO TABS: 4.0000 mg | ORAL_TABLET | ORAL | Status: DC | PRN

## 2017-05-02 MED ORDER — MEASLES, MUMPS & RUBELLA VAC ~~LOC~~ INJ
0.5000 mL | INJECTION | Freq: Once | SUBCUTANEOUS | Status: AC
  Administered 2017-05-04: 0.5 mL via SUBCUTANEOUS

## 2017-05-02 MED ORDER — ONDANSETRON HCL 4 MG/2ML IJ SOLN: 4.0000 mg | INTRAMUSCULAR | Status: DC | PRN

## 2017-05-02 MED ORDER — WITCH HAZEL-GLYCERIN EX PADS
1.0000 "application " | MEDICATED_PAD | CUTANEOUS | Status: DC | PRN
  Administered 2017-05-03: 1 via TOPICAL

## 2017-05-02 MED ORDER — COCONUT OIL OIL: 1.0000 "application " | TOPICAL_OIL | Status: DC | PRN

## 2017-05-02 NOTE — Anesthesia Procedure Notes (Signed)
Epidural Patient location during procedure: OB Start time: 05/02/2017 3:47 AM  Staffing Anesthesiologist: Leonides GrillsEllender, Matayah Reyburn P, MD Performed: anesthesiologist   Preanesthetic Checklist Completed: patient identified, site marked, pre-op evaluation, timeout performed, IV checked, risks and benefits discussed and monitors and equipment checked  Epidural Patient position: sitting Prep: DuraPrep Patient monitoring: heart rate, cardiac monitor, continuous pulse ox and blood pressure Approach: midline Location: L4-L5 Injection technique: LOR air  Needle:  Needle type: Tuohy  Needle gauge: 17 G Needle length: 9 cm Needle insertion depth: 8 cm Catheter type: closed end flexible Catheter size: 19 Gauge Catheter at skin depth: 13 cm Test dose: negative and Other  Assessment Events: blood not aspirated, injection not painful, no injection resistance and negative IV test  Additional Notes Informed consent obtained prior to proceeding including risk of failure, 1% risk of PDPH, risk of minor discomfort and bruising. Discussed alternatives to epidural analgesia and patient desires to proceed.  Timeout performed pre-procedure verifying patient name, procedure, and platelet count.  Loss of resistance times 2 at L3-L4, however unable to thread catheter both times. Loss of resistance and threading of catheter achieved on fourth attempt.  Patient tolerated procedure well. Reason for block:procedure for pain

## 2017-05-02 NOTE — Progress Notes (Addendum)
Prolonged epidural insertion. Pt. Spot checked during insertion. Pt. Unable to be continuously monitored due to patient positioning and habitus for epidural. Provider notified of delay and inability for continuous monitoring.

## 2017-05-02 NOTE — Progress Notes (Signed)
Wendy Gutierrez is a 22 y.o. G2P1001 at 7022w1d by LMP admitted for rupture of membranes  Subjective: Patient just had epidural. Reports still feeling contractions. States she has some back pain from having to bend forward for epidural placement.   Objective: BP (!) 115/52   Pulse 86   Temp 98.9 F (37.2 C) (Oral)   Resp 17   Ht 5\' 5"  (1.651 m)   Wt 105.7 kg (233 lb)   LMP 08/01/2016 (Exact Date)   SpO2 100%   BMI 38.77 kg/m  No intake/output data recorded. No intake/output data recorded.  FHT:  FHR: 130 bpm, variability: absent,  accelerations:  Present,  decelerations:  Absent UC:   regular, every 2-3 minutes SVE:   Dilation: 3.5 Effacement (%): 60 Station: -2 Exam by:: Darin EngelsAbraham DO  Labs: Lab Results  Component Value Date   WBC 7.6 05/01/2017   HGB 10.7 (L) 05/01/2017   HCT 33.5 (L) 05/01/2017   MCV 79.4 05/01/2017   PLT 186 05/01/2017    Assessment / Plan: Induction of labor due to PROM,  progressing well on pitocin  Labor: will continue to titrate pitocin as tolerated by fetus Fetal Wellbeing:  Category I Pain Control:  Epidural I/D:  n/a Anticipated MOD:  NSVD  Rommie Dunn Darin Engelsbraham 05/02/2017, 4:37 AM

## 2017-05-02 NOTE — Anesthesia Postprocedure Evaluation (Signed)
Anesthesia Post Note  Patient: Wendy Gutierrez  Procedure(s) Performed: AN AD HOC LABOR EPIDURAL     Patient location during evaluation: Mother Baby Anesthesia Type: Epidural Level of consciousness: awake and alert Pain management: pain level controlled Vital Signs Assessment: post-procedure vital signs reviewed and stable Respiratory status: spontaneous breathing, nonlabored ventilation and respiratory function stable Cardiovascular status: stable Postop Assessment: no headache, no backache, epidural receding, adequate PO intake, no apparent nausea or vomiting and patient able to bend at knees Anesthetic complications: no    Last Vitals:  Vitals:   05/02/17 1520 05/02/17 1620  BP: 135/60 116/72  Pulse: 90 87  Resp: 20 18  Temp: 37.3 C 36.8 C  SpO2:      Last Pain:  Vitals:   05/02/17 1620  TempSrc: Oral  PainSc:    Pain Goal:                 Land O'LakesMalinova,Thedora Rings Hristova

## 2017-05-02 NOTE — Anesthesia Pain Management Evaluation Note (Signed)
  CRNA Pain Management Visit Note  Patient: Wendy Gutierrez, 22 y.o., female  "Hello [facility]. We have an anesthesia team available at all times to provide care throughout the hospital, including epidural management and anesthesia for C-section. I don't know your plan for the delivery whether it a natural birth, water birth, IV sedation, nitrous supplementation, doula or epidural, but we want to meet your pain goals."   1.Was your pain managed to your expectations on prior hospitalizations?   Yes   2.What is your expectation for pain management during this hospitalization?     Epidural  3.How can we help you reach that goal? unsure  Record the patient's initial score and the patient's pain goal.   Pain: 0  Pain Goal: 9 [facility] wants you to be able to say your pain was always managed very well.  Cephus ShellingBURGER,Nakshatra Klose 05/02/2017

## 2017-05-02 NOTE — Progress Notes (Signed)
Labor Progress Note Wendy Gutierrez is a 22 y.o. G2P1001 at 7037w1d presented for PROM. S: Patient is annoyed and tired of being here.  O:  BP 123/79   Pulse 79   Temp 98.5 F (36.9 C) (Oral)   Resp 18   Ht 5\' 5"  (1.651 m)   Wt 105.7 kg (233 lb)   LMP 08/01/2016 (Exact Date)   SpO2 100%   BMI 38.77 kg/m  EFM: 130 bpm/mod var/pos acels  CVE: Dilation: 4 Effacement (%): 80 Cervical Position: Middle Station: -2 Presentation: Vertex Exam by:: Dr. Rachelle HoraMoss   A&P: 22 y.o. G2P1001 1237w1d here for PROM. #Labor: Cervix is essentially unchanged since 0422. Pitocin at 18 mU/min at this time. IUPC placed. Increase pitocin until adequate contraction pattern. #Pain: epidural   Rolm BookbinderAmber Abie Cheek, DO 10:33 AM

## 2017-05-02 NOTE — Anesthesia Preprocedure Evaluation (Signed)
Anesthesia Evaluation  Patient identified by MRN, date of birth, ID band Patient awake    Reviewed: Allergy & Precautions, H&P , NPO status , Patient's Chart, lab work & pertinent test results  History of Anesthesia Complications Negative for: history of anesthetic complications  Airway Mallampati: II  TM Distance: >3 FB Neck ROM: full    Dental no notable dental hx. (+) Teeth Intact   Pulmonary neg pulmonary ROS,    Pulmonary exam normal breath sounds clear to auscultation       Cardiovascular negative cardio ROS Normal cardiovascular exam Rhythm:regular Rate:Normal     Neuro/Psych negative neurological ROS  negative psych ROS   GI/Hepatic negative GI ROS, Neg liver ROS,   Endo/Other  negative endocrine ROS  Renal/GU negative Renal ROS  negative genitourinary   Musculoskeletal   Abdominal (+) + obese,   Peds  Hematology  (+) anemia ,   Anesthesia Other Findings   Reproductive/Obstetrics (+) Pregnancy                             Anesthesia Physical Anesthesia Plan  ASA: II  Anesthesia Plan: Epidural   Post-op Pain Management:    Induction:   PONV Risk Score and Plan:   Airway Management Planned:   Additional Equipment:   Intra-op Plan:   Post-operative Plan:   Informed Consent: I have reviewed the patients History and Physical, chart, labs and discussed the procedure including the risks, benefits and alternatives for the proposed anesthesia with the patient or authorized representative who has indicated his/her understanding and acceptance.     Plan Discussed with:   Anesthesia Plan Comments:         Anesthesia Quick Evaluation  

## 2017-05-02 NOTE — Progress Notes (Deleted)
Called Dr. Prothero about pt saturated honey comb dressing. Dr. Prothero wants dressing changed. Order is placed and dressing is changed.    

## 2017-05-03 NOTE — Progress Notes (Signed)
POSTPARTUM PROGRESS NOTE  Post Partum Day 1  Subjective:  Wendy Gutierrez is a 22 y.o. [id number] s/p SVD at 10924w1d.  No acute events overnight.  Pt denies problems with ambulating, voiding or po intake.  She denies nausea or vomiting.  Pain is well controlled.  She has had flatus. She has had bowel movement.  Lochia Minimal.   Objective: Blood pressure 128/64, pulse 75, temperature 98.6 F (37 C), temperature source Oral, resp. rate 18, height 5\' 5"  (1.651 m), weight 99.6 kg (219 lb 9.6 oz), last menstrual period 08/01/2016, SpO2 100 %, unknown if currently breastfeeding.  Physical Exam:  General: alert, cooperative and no distress Chest: no respiratory distress Heart:regular rate, distal pulses intact Abdomen: soft, nontender Uterine Fundus: firm, appropriately tender DVT Evaluation: No calf swelling or tenderness Extremities: Minimal edema Skin: warm, dry  Recent Labs    05/01/17 2020  HGB 10.7*  HCT 33.5*    Assessment/Plan: Wendy Gutierrez is a 22 y.o. [id number] s/p SVD at 4224w1d   PPD#1 - Doing well Contraception: OCPs Feeding: Bottle Dispo: Plan for discharge tomorrow.   LOS: 2 days   Mikal PlaneBenjamin A Herbert Aguinaldo MS3 05/03/2017, 6:27 AM

## 2017-05-04 ENCOUNTER — Encounter: Payer: BLUE CROSS/BLUE SHIELD | Admitting: Certified Nurse Midwife

## 2017-05-04 MED ORDER — IBUPROFEN 600 MG PO TABS: 600.0000 mg | ORAL_TABLET | Freq: Four times a day (QID) | ORAL | 0 refills | Status: DC

## 2017-05-04 MED ORDER — SENNOSIDES-DOCUSATE SODIUM 8.6-50 MG PO TABS: 2.0000 | ORAL_TABLET | ORAL | 0 refills | Status: DC

## 2017-05-04 MED ORDER — MEASLES, MUMPS & RUBELLA VAC ~~LOC~~ INJ
0.5000 mL | INJECTION | Freq: Once | SUBCUTANEOUS | Status: AC
  Filled 2017-05-04 (×2): qty 0.5

## 2017-05-04 NOTE — Discharge Instructions (Signed)

## 2017-05-04 NOTE — Discharge Summary (Signed)
  OB Discharge Summary     Patient Name: Wendy Wendy Gutierrez DOB: 03/07/1996 MRN: [id number]  Date of admission: 05/01/2017 Delivering MD: LELAND, PARKER W   Date of discharge: 05/04/2017  Admitting diagnosis: 39wks Check Fluids poss leaking Intrauterine pregnancy: [redacted]w[redacted]d     Secondary diagnosis:  Principal Problem:   PROM (premature rupture of membranes) Active Problems:   Encounter for supervision of other normal pregnancy, unspecified trimester   LGSIL on Pap smear of cervix   Maternal obesity, antepartum   Pregnancy   SVD (spontaneous vaginal delivery)  Additional problems: none     Discharge diagnosis: Term Pregnancy Delivered                                                                                                Post partum procedures:MMR offered prior to d/c  Augmentation: Pitocin  Complications: None  Hospital course:  Induction of Labor With Vaginal Delivery   22 y.o. yo G2P2002 at [redacted]w[redacted]d was admitted to the hospital 05/01/2017 for induction of labor.  Indication for induction: PROM.  Patient had an uncomplicated labor course as follows: Membrane Rupture Time/Date: 7:52 PM ,05/01/2017   Intrapartum Procedures: Episiotomy: None [1]                                         Lacerations:  None [1]  Patient had delivery of a Viable infant.  Information for the patient's newborn:  Wendy Gutierrez, Boy Caoilainn [[id number]]  Delivery Method: Vag-Spont   05/02/2017  Details of delivery can be found in separate delivery note.  Patient had a routine postpartum course. Patient is discharged home 05/04/17.  Physical exam  Vitals:   05/02/17 2022 05/03/17 0613 05/03/17 1927 05/04/17 0626  BP: 123/73 128/64 114/71 114/67  Pulse: 75 75 73 65  Resp: 18 18 18 18  Temp: 98.8 F (37.1 C) 98.6 F (37 C) 98.3 F (36.8 C) 98.2 F (36.8 C)  TempSrc: Oral Oral Oral Oral  SpO2:      Weight:  99.6 kg (219 lb 9.6 oz)  100.5 kg (221 lb 9.6 oz)  Height:       General: alert, cooperative and  no distress Lochia: appropriate Uterine Fundus: firm Incision: N/A DVT Evaluation: No evidence of DVT seen on physical exam. No cords or calf tenderness. No significant calf/ankle edema. Labs: Lab Results  Component Value Date   WBC 7.6 05/01/2017   HGB 10.7 (L) 05/01/2017   HCT 33.5 (L) 05/01/2017   MCV 79.4 05/01/2017   PLT 186 05/01/2017   CMP Latest Ref Rng & Units 07/03/2011  Glucose 70 - 99 mg/dL 82  BUN 6 - 23 mg/dL 9  Creatinine 0.47 - 1.00 mg/dL 0.60  Sodium 135 - 145 mEq/L 138  Potassium 3.5 - 5.1 mEq/L 4.0  Chloride 96 - 112 mEq/L 107    Discharge instruction: per After Visit Summary and "Baby and Me Booklet".  After visit meds:  Allergies as of 05/04/2017   No Known Allergies       Medication List    STOP taking these medications   COMFORT FIT MATERNITY SUPP MED Misc     TAKE these medications   ibuprofen 600 MG tablet Commonly known as:  ADVIL,MOTRIN Take 1 tablet (600 mg total) by mouth every 6 (six) hours.   prenatal multivitamin Tabs tablet Take 1 tablet by mouth daily at 12 noon.   senna-docusate 8.6-50 MG tablet Commonly known as:  Senokot-S Take 2 tablets by mouth daily. Start taking on:  05/05/2017       Diet: routine diet  Activity: Advance as tolerated. Pelvic rest for 6 weeks.   Outpatient follow up:4 weeks Follow up Appt: Future Appointments  Date Time Provider Department Center  05/30/2017 10:30 AM Denney, Rachelle A, CNM [facility] None   Follow up Visit:No Follow-up on file. Follow-up Information    [facility] .   Specialty:  Obstetrics and Gynecology Contact information: [address], [address] [place] [address] [phone]         Postpartum contraception: Combination OCPs  Newborn Data: Live born female  Birth Weight: 7 lb 6.2 oz (3351 g) APGAR: 9, 9  Newborn Delivery   Birth date/time:  05/02/2017 13:49:00 Delivery type:  Vaginal, Spontaneous     Baby  Feeding: Bottle Disposition:home with mother   05/04/2017 Sherin Abraham, DO  CNM attestation I have seen and examined this patient and agree with above documentation in the resident's note.   Wendy Wendy Gutierrez is a 22 y.o. G2P2002 s/p SVD.   Pain is well controlled.  Plan for birth control is oral contraceptives (estrogen/progesterone).  Method of Feeding: bottle  PE:  BP 114/67 (BP Location: Right Arm)   Pulse 65   Temp 98.2 F (36.8 C) (Oral)   Resp 18   Ht 5' 5" (1.651 m)   Wt 100.5 kg (221 lb 9.6 oz)   LMP 08/01/2016 (Exact Date)   SpO2 100%   Breastfeeding? Unknown   BMI 36.88 kg/m  Fundus firm  Recent Labs    05/01/17 2020  HGB 10.7*  HCT 33.5*     Plan: discharge today - postpartum care discussed - f/u clinic in 4 weeks for postpartum visit   SHAW, KIMBERLY, CNM 7:21 AM 05/04/2017   

## 2017-05-30 ENCOUNTER — Encounter: Payer: Self-pay | Admitting: Certified Nurse Midwife

## 2017-05-30 ENCOUNTER — Ambulatory Visit (INDEPENDENT_AMBULATORY_CARE_PROVIDER_SITE_OTHER): Payer: BLUE CROSS/BLUE SHIELD | Admitting: Certified Nurse Midwife

## 2017-05-30 ENCOUNTER — Other Ambulatory Visit (HOSPITAL_COMMUNITY)
Admission: RE | Admit: 2017-05-30 | Discharge: 2017-05-30 | Disposition: A | Discharge status: Home / Self Care | Payer: BLUE CROSS/BLUE SHIELD | Source: Ambulatory Visit | Attending: Certified Nurse Midwife | Admitting: Certified Nurse Midwife

## 2017-05-30 DIAGNOSIS — Z30013 Encounter for initial prescription of injectable contraceptive: Secondary | ICD-10-CM

## 2017-05-30 DIAGNOSIS — N898 Other specified noninflammatory disorders of vagina: Secondary | ICD-10-CM

## 2017-05-30 DIAGNOSIS — Z3202 Encounter for pregnancy test, result negative: Secondary | ICD-10-CM | POA: Diagnosis not present

## 2017-05-30 DIAGNOSIS — Z1389 Encounter for screening for other disorder: Secondary | ICD-10-CM

## 2017-05-30 DIAGNOSIS — Z3042 Encounter for surveillance of injectable contraceptive: Secondary | ICD-10-CM | POA: Diagnosis not present

## 2017-05-30 LAB — POCT URINE PREGNANCY: Preg Test, Ur: NEGATIVE

## 2017-05-30 MED ORDER — MEDROXYPROGESTERONE ACETATE 150 MG/ML IM SUSP
150.0000 mg | Freq: Once | INTRAMUSCULAR | Status: AC
  Administered 2017-05-30: 150 mg via INTRAMUSCULAR

## 2017-05-30 MED ORDER — MEDROXYPROGESTERONE ACETATE 150 MG/ML IM SUSP: 150.0000 mg | INTRAMUSCULAR | 4 refills | Status: DC

## 2017-05-30 NOTE — Patient Instructions (Addendum)
Cervical Biopsy  A cervical biopsy is a procedure to remove a small sample of tissue from the cervix. The cervix is the lowest part of the womb (uterus), which opens into the vagina (birth canal).  You may have this procedure to check for cancer or growths that may become cancer. This procedure may also be done if the results of your Pap test were abnormal or if your health care provider saw an abnormality during a pelvic exam.  Tell a health care provider about:   Any allergies you have.   All medicines you are taking, including vitamins, herbs, eye drops, creams, and over-the-counter medicines.   Any problems you or family members have had with anesthetic medicines.   Any blood disorders you have.   Any surgeries you have had.   Any medical conditions you have.   Whether you are pregnant or may be pregnant.   Whether you are having your menstrual period or will be having your period at the time of the procedure.  What are the risks?  Generally, this is a safe procedure. However, problems may occur, including:   Infection.   Bleeding.   Allergic reactions to medicines or dyes.   Damage to other structures or organs.    What happens before the procedure?   Do not douche, have sex, use tampons, or use any vaginal medicines before the procedure as told by your health care provider.   Follow instructions from your health care provider about eating or drinking restrictions.   Ask your health care provider about:  ? Changing or stopping your regular medicines. This is especially important if you are taking diabetes medicines or blood thinners.  ? Taking medicines such as aspirin and ibuprofen. These medicines can thin your blood. Do not take these medicines before your procedure if your health care provider instructs you not to.   You may be given an over-the-counter pain medicine to take right before the procedure.   You may be asked to empty your bladder and bowel right before the procedure.  What  happens during the procedure?   You will undress from the waist down.   You will lie on an examining table and put your feet in stirrups.   To reduce your risk of infection:  ? Your health care team will wash or sanitize their hands.  ? Your skin will be washed with soap.   Your health care provider will use a lubricated instrument (speculum) to open your vagina. An instrument that has a magnifying lens and a light (colposcope) will let your health care provider examine the cervix more closely.   You may be given a medicine to numb the area (local anesthetic).   Your health care provider will apply a solution to your cervix. This turns abnormal areas a pale color.   Your health care provider will use an instrument (biopsy forceps) to take one or more small pieces of tissue that appear abnormal.   If there seems to be an abnormal area in the part of your cervix that leads to the uterus (endocervical canal), your health care provider will use an instrument (curette) to scrape tissue from that area. This is called endocervical curettage.   Your health care provider will apply a paste over the biopsy areas to help control bleeding.  The procedure may vary among health care providers and hospitals.  What happens after the procedure?  It is your responsibility to get the results of your procedure. Ask your   health care provider. Document Released: 03/28/2005 Document Revised: 08/06/2015 Document Reviewed: 08/13/2014 Elsevier Interactive Patient Education  2018 [employer]. Cervical Dysplasia Cervical dysplasia is a condition in which a woman's cervix cells have abnormal changes. The cervix is the opening of the uterus (womb). It is located between the vagina and the  uterus. Cervical dysplasia may be an early sign of cervical cancer. If left untreated, this condition may become more severe and may progress to cervical cancer. Early detection, treatment, and follow-up care are very important. What are the causes? Cervical dysplasia can be caused by a human papillomavirus (HPV) infection. HPV is the most common sexually transmitted infection (STI). HPV is spread from person to person through sexual contact. This includes oral, vaginal, or anal sex. What increases the risk? The following factors may make you more likely to develop this condition:  Having had a sexually transmitted infection (STI), such as herpes, chlamydia or HPV.  Becoming sexually active before age 24.  Having had more than one sexual partner.  Having a sexual partner who has multiple sexual partners.  Not using protection, such as a condom, during sex, especially with new sexual partners.  Having a history of cancer of the vagina or vulva.  Having a weakened body defense (immune) system.  Being the daughter of a woman who took diethylstilbestrol (DES) during pregnancy.  Having a family history of cervical cancer.  Smoking.  Using oral contraceptives, also called birth control pills.  Having had three or more full-term pregnancies.  What are the signs or symptoms? There are usually no symptoms of this condition. If you do have symptoms, they may include:  Abnormal vaginal discharge.  Bleeding between periods or after sex.  Bleeding during menopause.  Pain during sex (dyspareunia).  How is this diagnosed? A test called a Pap test may be done. During this test, cells are taken from the cervix and then examined under a microscope. A test in which tissue is removed from the cervix (biopsy) may also be done if the Pap test is abnormal or if the cervix looks abnormal. How is this treated? Treatment varies based on the severity of the condition. Treatment may  include:  Cryotherapy. During cryotherapy, the abnormal cells are frozen with a steel-tip instrument.  Loop electrosurgical excision procedure (LEEP). This procedure removes abnormal tissue from the cervix.  Surgery to remove abnormal tissue. This is usually done in more advanced cases. Surgical options include: ? A cone biopsy. This is a procedure in which the cervical canal and a portion of the center of the cervix are removed. ? Hysterectomy. This is a surgery in which the uterus and cervix are removed.  Follow these instructions at home:  Take over-the-counter and prescription medicines only as told by your health care provider.  Do not use tampons, have sex, or douche until your health care provider says it is okay.  Keep follow-up visits as told by your health care provider. This is important. Women who have been treated for cervical dysplasia should have regular pelvic exams and Pap tests as told by their health care provider. How is this prevented?  Practice safe sex to help prevent sexually transmitted infections (STI) that may cause this condition.  Have regular Pap tests. Talk with your health care provider about how often you need these tests. Pap tests will help identify cell changes that can lead to cancer. Contact a health care provider if:  You develop genital warts. Get help right away if:  You  have a fever.  You have abnormal vaginal discharge.  Your menstrual period is heavier than normal.  You develop bright red bleeding. This may include blood clots.  You have pain or cramps that get worse, and medicine does not help to relieve your pain.  You feel light-headed and you are unusually weak.  You have fainting spells.  You have pain in the abdomen. Summary  Cervical dysplasia is a condition in which a woman's cervix cells have abnormal changes.  If left untreated, this condition may become more severe and may progress to cervical cancer.  Early  detection, treatment, and follow-up care are very important in managing this condition.  Have regular pelvic exams and Pap tests. Talk with your health care provider about how often you need these tests. Pap tests will help identify cell changes that can lead to cancer. This information is not intended to replace advice given to you by your health care provider. Make sure you discuss any questions you have with your health care provider. Document Released: 03/28/2005 Document Revised: 03/31/2016 Document Reviewed: 03/31/2016 Elsevier Interactive Patient Education  2017 [employer]  Contraception Choices Contraception, also called birth control, refers to methods or devices that prevent pregnancy. Hormonal methods Contraceptive implant A contraceptive implant is a thin, plastic tube that contains a hormone. It is inserted into the upper part of the arm. It can remain in place for up to 3 years. Progestin-only injections Progestin-only injections are injections of progestin, a synthetic form of the hormone progesterone. They are given every 3 months by a health care provider. Birth control pills Birth control pills are pills that contain hormones that prevent pregnancy. They must be taken once a day, preferably at the same time each day. Birth control patch The birth control patch contains hormones that prevent pregnancy. It is placed on the skin and must be changed once a week for three weeks and removed on the fourth week. A prescription is needed to use this method of contraception. Vaginal ring A vaginal ring contains hormones that prevent pregnancy. It is placed in the vagina for three weeks and removed on the fourth week. After that, the process is repeated with a new ring. A prescription is needed to use this method of contraception. Emergency contraceptive Emergency contraceptives prevent pregnancy after unprotected sex. They come in pill form and can be taken up to 5 days after sex.  They work best the sooner they are taken after having sex. Most emergency contraceptives are available without a prescription. This method should not be used as your only form of birth control. Barrier methods Female condom A female condom is a thin sheath that is worn over the penis during sex. Condoms keep sperm from going inside a woman's body. They can be used with a spermicide to increase their effectiveness. They should be disposed after a single use. Female condom A female condom is a soft, loose-fitting sheath that is put into the vagina before sex. The condom keeps sperm from going inside a woman's body. They should be disposed after a single use. Diaphragm A diaphragm is a soft, dome-shaped barrier. It is inserted into the vagina before sex, along with a spermicide. The diaphragm blocks sperm from entering the uterus, and the spermicide kills sperm. A diaphragm should be left in the vagina for 6-8 hours after sex and removed within 24 hours. A diaphragm is prescribed and fitted by a health care provider. A diaphragm should be replaced every 1-2 years, after giving  birth, after gaining more than 15 lb (6.8 kg), and after pelvic surgery. Cervical cap A cervical cap is a round, soft latex or plastic cup that fits over the cervix. It is inserted into the vagina before sex, along with spermicide. It blocks sperm from entering the uterus. The cap should be left in place for 6-8 hours after sex and removed within 48 hours. A cervical cap must be prescribed and fitted by a health care provider. It should be replaced every 2 years. Sponge A sponge is a soft, circular piece of polyurethane foam with spermicide on it. The sponge helps block sperm from entering the uterus, and the spermicide kills sperm. To use it, you make it wet and then insert it into the vagina. It should be inserted before sex, left in for at least 6 hours after sex, and removed and thrown away within 30 hours. Spermicides Spermicides  are chemicals that kill or block sperm from entering the cervix and uterus. They can come as a cream, jelly, suppository, foam, or tablet. A spermicide should be inserted into the vagina with an applicator at least 10-15 minutes before sex to allow time for it to work. The process must be repeated every time you have sex. Spermicides do not require a prescription. Intrauterine contraception Intrauterine device (IUD) An IUD is a T-shaped device that is put in a woman's uterus. There are two types:  Hormone IUD.This type contains progestin, a synthetic form of the hormone progesterone. This type can stay in place for 3-5 years.  Copper IUD.This type is wrapped in copper wire. It can stay in place for 10 years.  Permanent methods of contraception Female tubal ligation In this method, a woman's fallopian tubes are sealed, tied, or blocked during surgery to prevent eggs from traveling to the uterus. Hysteroscopic sterilization In this method, a small, flexible insert is placed into each fallopian tube. The inserts cause scar tissue to form in the fallopian tubes and block them, so sperm cannot reach an egg. The procedure takes about 3 months to be effective. Another form of birth control must be used during those 3 months. Female sterilization This is a procedure to tie off the tubes that carry sperm (vasectomy). After the procedure, the man can still ejaculate fluid (semen). Natural planning methods Natural family planning In this method, a couple does not have sex on days when the woman could become pregnant. Calendar method This means keeping track of the length of each menstrual cycle, identifying the days when pregnancy can happen, and not having sex on those days. Ovulation method In this method, a couple avoids sex during ovulation. Symptothermal method This method involves not having sex during ovulation. The woman typically checks for ovulation by watching changes in her temperature and in  the consistency of cervical mucus. Post-ovulation method In this method, a couple waits to have sex until after ovulation. Summary  Contraception, also called birth control, means methods or devices that prevent pregnancy.  Hormonal methods of contraception include implants, injections, pills, patches, vaginal rings, and emergency contraceptives.  Barrier methods of contraception can include female condoms, female condoms, diaphragms, cervical caps, sponges, and spermicides.  There are two types of IUDs (intrauterine devices). An IUD can be put in a woman's uterus to prevent pregnancy for 3-5 years.  Permanent sterilization can be done through a procedure for males, females, or both.  Natural family planning methods involve not having sex on days when the woman could become pregnant. This information is  not intended to replace advice given to you by your health care provider. Make sure you discuss any questions you have with your health care provider. Document Released: 03/28/2005 Document Revised: 04/30/2016 Document Reviewed: 04/30/2016 Elsevier Interactive Patient Education  2018 [employer].

## 2017-05-30 NOTE — Progress Notes (Signed)
Post Partum Exam  Wendy Gutierrez is a 22 y.o. 592P2002 female who presents for a postpartum visit. She is 4 weeks postpartum following a spontaneous vaginal delivery. I have fully reviewed the prenatal and intrapartum course. The delivery was at 2443w1d gestational weeks.  Anesthesia: epidural. Postpartum course has been unremarkable. Baby's course has been unremarkable. Baby is feeding by bottle - Octavia HeirGerber Soothe. Bleeding no bleeding. Bowel function is normal. Bladder function is normal. Patient is not sexually active. Contraception method is none. Postpartum depression screening:neg EPDS: 1. Used office supply Depo. Administrations This Visit    medroxyPROGESTERone (DEPO-PROVERA) injection 150 mg    Admin Date 05/30/2017 Action Given Dose 150 mg Route Intramuscular Administered By Lewayne BuntingGardner, Tyvona, CMA         The following portions of the patient's history were reviewed and updated as appropriate: allergies, current medications, past family history, past medical history, past social history, past surgical history and problem list. Last pap smear done 09/29/16 and was Abnormal- LSIL.  Review of Systems Pertinent items noted in HPI and remainder of comprehensive ROS otherwise negative.    Objective:  Last menstrual period 08/01/2016, unknown if currently breastfeeding.  General:  alert, cooperative and no distress   Breasts:  inspection negative, no nipple discharge or bleeding, no masses or nodularity palpable  Lungs: clear to auscultation bilaterally  Heart:  regular rate and rhythm, S1, S2 normal, no murmur, click, rub or gallop  Abdomen: soft, non-tender; bowel sounds normal; no masses,  no organomegaly   Vulva:  normal  Vagina: vagina positive for + thin, gray vaginal discharge  Cervix:  no bleeding following Pap  Corpus: normal size, contour, position, consistency, mobility, non-tender  Adnexa:  normal adnexa  Rectal Exam: Not performed.        Assessment:    Normal 4 week  postpartum exam. Pap smear done at today's visit.   Hx of abnormal pap smears   Plan:   1. Contraception: condoms and Depo-Provera injections 2. Depo provera given today in office.  3. Follow up in: 3 months for depo injection, PRN colpo depending on Pap result or as needed.

## 2017-05-31 LAB — CERVICOVAGINAL ANCILLARY ONLY
Bacterial vaginitis: POSITIVE — AB
CANDIDA VAGINITIS: NEGATIVE
CHLAMYDIA, DNA PROBE: NEGATIVE
NEISSERIA GONORRHEA: NEGATIVE
Trichomonas: NEGATIVE

## 2017-06-02 ENCOUNTER — Other Ambulatory Visit: Payer: Self-pay | Admitting: Certified Nurse Midwife

## 2017-06-02 DIAGNOSIS — N760 Acute vaginitis: Principal | ICD-10-CM

## 2017-06-02 DIAGNOSIS — B9689 Other specified bacterial agents as the cause of diseases classified elsewhere: Secondary | ICD-10-CM

## 2017-06-02 LAB — CYTOLOGY - PAP
DIAGNOSIS: UNDETERMINED — AB
HPV (WINDOPATH): DETECTED — AB
HPV 16/18/45 genotyping: NEGATIVE

## 2017-06-02 MED ORDER — TINIDAZOLE 500 MG PO TABS: 2.0000 g | ORAL_TABLET | Freq: Every day | ORAL | 0 refills | Status: DC

## 2017-06-08 ENCOUNTER — Other Ambulatory Visit: Payer: Self-pay | Admitting: Certified Nurse Midwife

## 2017-06-08 ENCOUNTER — Telehealth: Payer: Self-pay

## 2017-06-08 DIAGNOSIS — R87610 Atypical squamous cells of undetermined significance on cytologic smear of cervix (ASC-US): Secondary | ICD-10-CM | POA: Insufficient documentation

## 2017-06-08 NOTE — Telephone Encounter (Signed)
Patient notified.  Verbalized understanding. 

## 2017-06-08 NOTE — Telephone Encounter (Signed)
-----   Message from Roe Coombsachelle A Denney, CNM sent at 06/08/2017  9:58 AM EST ----- Please let her know that she had some changes on her pap smear and that we will repeat it in one year, nothing to worry about.  Thank you.  R.Denney CNM

## 2017-06-29 ENCOUNTER — Telehealth: Payer: Self-pay

## 2017-06-29 NOTE — Telephone Encounter (Signed)
TC from pt stating she was returning to work. Pt was unsure is she needed paper work. Pt advised that she had a normal post partum visit w/ no noted restrictions And that if her employer needed /requesting a note that can be provided and to let us know.  Pt voiced understanding.

## 2017-08-15 ENCOUNTER — Ambulatory Visit: Payer: BLUE CROSS/BLUE SHIELD

## 2017-08-16 ENCOUNTER — Ambulatory Visit (INDEPENDENT_AMBULATORY_CARE_PROVIDER_SITE_OTHER): Payer: BLUE CROSS/BLUE SHIELD | Admitting: *Deleted

## 2017-08-16 VITALS — BP 124/82 | HR 73 | Wt 206.0 lb

## 2017-08-16 DIAGNOSIS — Z3042 Encounter for surveillance of injectable contraceptive: Secondary | ICD-10-CM | POA: Diagnosis not present

## 2017-08-17 MED ORDER — MEDROXYPROGESTERONE ACETATE 150 MG/ML IM SUSP
150.0000 mg | Freq: Once | INTRAMUSCULAR | Status: AC
  Administered 2017-08-16: 150 mg via INTRAMUSCULAR

## 2017-08-17 NOTE — Progress Notes (Signed)
I have reviewed the chart and agree with nursing staff's documentation of this patient's encounter.  Rachelle A Denney, CNM 08/17/2017 3:37 PM    

## 2017-08-17 NOTE — Progress Notes (Signed)
Pt in office for depo injection. Pt tolerated injection well. Pt advised to [facility] July 23-Aug7 for next depo.  BP 124/82   Pulse 73   Wt 206 lb (93.4 kg)   BMI 34.28 kg/m    Administrations This Visit    medroxyPROGESTERone (DEPO-PROVERA) injection 150 mg    Admin Date 08/16/2017 Action Given Dose 150 mg Route Intramuscular Administered By Lanney Gins, CMA

## 2017-11-08 ENCOUNTER — Ambulatory Visit (INDEPENDENT_AMBULATORY_CARE_PROVIDER_SITE_OTHER): Payer: BLUE CROSS/BLUE SHIELD

## 2017-11-08 ENCOUNTER — Encounter: Payer: Self-pay | Admitting: *Deleted

## 2017-11-08 DIAGNOSIS — Z3042 Encounter for surveillance of injectable contraceptive: Secondary | ICD-10-CM | POA: Diagnosis not present

## 2017-11-08 MED ORDER — MEDROXYPROGESTERONE ACETATE 150 MG/ML IM SUSP
150.0000 mg | Freq: Once | INTRAMUSCULAR | Status: AC
  Administered 2017-11-08: 150 mg via INTRAMUSCULAR

## 2017-11-08 NOTE — Progress Notes (Signed)
Pt in office for depo only. Pt is on time for injection, pt supplied depo. Injection given in R arm, tolerated well. Pts next depo injection will be due Oct 16- Oct 30.

## 2018-01-29 ENCOUNTER — Ambulatory Visit (INDEPENDENT_AMBULATORY_CARE_PROVIDER_SITE_OTHER): Payer: BLUE CROSS/BLUE SHIELD | Admitting: *Deleted

## 2018-01-29 VITALS — BP 123/68 | HR 89 | Wt 208.0 lb

## 2018-01-29 DIAGNOSIS — Z3042 Encounter for surveillance of injectable contraceptive: Secondary | ICD-10-CM

## 2018-01-29 MED ORDER — MEDROXYPROGESTERONE ACETATE 150 MG/ML IM SUSP
150.0000 mg | Freq: Once | INTRAMUSCULAR | Status: AC
  Administered 2018-01-29: 150 mg via INTRAMUSCULAR

## 2018-01-29 NOTE — Progress Notes (Signed)
Pt is in office today for Depo injection. Pt is on time for injection. Pt tolerated injection well.  Pt states some occasional spotting when time for next injection.  Pt states no other concerns.  BP 123/68   Pulse 89   Wt 208 lb (94.3 kg)   BMI 34.61 kg/m   Administrations This Visit    medroxyPROGESTERone (DEPO-PROVERA) injection 150 mg    Admin Date 01/29/2018 Action Given Dose 150 mg Route Intramuscular Administered By Lanney Gins, CMA

## 2018-01-30 NOTE — Progress Notes (Signed)
I have reviewed the chart and agree with nursing staff's documentation of this patient's encounter.  Catalina Antigua, MD 01/30/2018 3:46 PM

## 2018-02-06 ENCOUNTER — Emergency Department (HOSPITAL_COMMUNITY)
Admission: EM | Admit: 2018-02-06 | Discharge: 2018-02-06 | Disposition: A | Discharge status: Home / Self Care | Payer: BLUE CROSS/BLUE SHIELD | Attending: Emergency Medicine | Admitting: Emergency Medicine

## 2018-02-06 ENCOUNTER — Emergency Department (HOSPITAL_COMMUNITY): Payer: BLUE CROSS/BLUE SHIELD

## 2018-02-06 ENCOUNTER — Other Ambulatory Visit: Payer: Self-pay

## 2018-02-06 ENCOUNTER — Encounter (HOSPITAL_COMMUNITY): Payer: Self-pay | Admitting: *Deleted

## 2018-02-06 DIAGNOSIS — R0602 Shortness of breath: Secondary | ICD-10-CM

## 2018-02-06 DIAGNOSIS — Z79899 Other long term (current) drug therapy: Secondary | ICD-10-CM | POA: Insufficient documentation

## 2018-02-06 DIAGNOSIS — J40 Bronchitis, not specified as acute or chronic: Secondary | ICD-10-CM

## 2018-02-06 LAB — BASIC METABOLIC PANEL
Anion gap: 10 (ref 5–15)
BUN: 14 mg/dL (ref 6–20)
CO2: 21 mmol/L — ABNORMAL LOW (ref 22–32)
Calcium: 8.9 mg/dL (ref 8.9–10.3)
Chloride: 107 mmol/L (ref 98–111)
Creatinine, Ser: 0.91 mg/dL (ref 0.44–1.00)
GFR calc Af Amer: >60 mL/min (ref >60)
GFR calc non Af Amer: >60 mL/min (ref >60)
Glucose, Bld: 102 mg/dL — ABNORMAL HIGH (ref 70–99)
Potassium: 3.8 mmol/L (ref 3.5–5.1)
Sodium: 138 mmol/L (ref 135–145)

## 2018-02-06 LAB — CBC WITH DIFFERENTIAL/PLATELET
ABS IMMATURE GRANULOCYTES: 0.04 10*3/uL (ref 0.00–0.07)
BASOS ABS: 0.1 10*3/uL (ref 0.0–0.1)
Basophils Relative: 1 %
Eosinophils Absolute: 0.3 10*3/uL (ref 0.0–0.5)
Eosinophils Relative: 3 %
HCT: 41.3 % (ref 36.0–46.0)
HEMOGLOBIN: 12.6 g/dL (ref 12.0–15.0)
Immature Granulocytes: 0 %
LYMPHS ABS: 2.4 10*3/uL (ref 0.7–4.0)
Lymphocytes Relative: 26 %
MCH: 25.5 pg — AB (ref 26.0–34.0)
MCHC: 30.5 g/dL (ref 30.0–36.0)
MCV: 83.4 fL (ref 80.0–100.0)
MONO ABS: 0.6 10*3/uL (ref 0.1–1.0)
Monocytes Relative: 6 %
NEUTROS ABS: 5.9 10*3/uL (ref 1.7–7.7)
Neutrophils Relative %: 64 %
Platelets: 222 10*3/uL (ref 150–400)
RBC: 4.95 MIL/uL (ref 3.87–5.11)
RDW: 13.5 % (ref 11.5–15.5)
WBC: 9.4 10*3/uL (ref 4.0–10.5)
nRBC: 0 % (ref 0.0–0.2)

## 2018-02-06 LAB — I-STAT BETA HCG BLOOD, ED (NOT ORDERABLE): I-stat hCG, quantitative: <5 m[IU]/mL (ref <5)

## 2018-02-06 LAB — D-DIMER, QUANTITATIVE (NOT AT ARMC): D DIMER QUANT: 0.53 ug{FEU}/mL — AB (ref 0.00–0.50)

## 2018-02-06 LAB — POCT I-STAT TROPONIN I: Troponin i, poc: 0.01 ng/mL (ref 0.00–0.08)

## 2018-02-06 MED ORDER — IOPAMIDOL (ISOVUE-370) INJECTION 76%
100.0000 mL | Freq: Once | INTRAVENOUS | Status: AC | PRN
  Administered 2018-02-06: 100 mL via INTRAVENOUS

## 2018-02-06 MED ORDER — IOPAMIDOL (ISOVUE-370) INJECTION 76%
INTRAVENOUS | Status: AC
  Filled 2018-02-06: qty 100

## 2018-02-06 MED ORDER — PREDNISONE 20 MG PO TABS: 40.0000 mg | ORAL_TABLET | Freq: Every day | ORAL | 0 refills | Status: AC

## 2018-02-06 MED ORDER — ALBUTEROL SULFATE HFA 108 (90 BASE) MCG/ACT IN AERS
1.0000 | INHALATION_SPRAY | Freq: Once | RESPIRATORY_TRACT | Status: AC
  Administered 2018-02-06: 1 via RESPIRATORY_TRACT
  Filled 2018-02-06: qty 6.7

## 2018-02-06 MED ORDER — SODIUM CHLORIDE 0.9 % IJ SOLN
INTRAMUSCULAR | Status: AC
  Filled 2018-02-06: qty 50

## 2018-02-06 MED ORDER — IPRATROPIUM-ALBUTEROL 0.5-2.5 (3) MG/3ML IN SOLN
3.0000 mL | Freq: Once | RESPIRATORY_TRACT | Status: AC
  Administered 2018-02-06: 3 mL via RESPIRATORY_TRACT
  Filled 2018-02-06: qty 3

## 2018-02-06 MED ORDER — METHYLPREDNISOLONE SODIUM SUCC 125 MG IJ SOLR
125.0000 mg | Freq: Once | INTRAMUSCULAR | Status: AC
  Administered 2018-02-06: 125 mg via INTRAVENOUS
  Filled 2018-02-06: qty 2

## 2018-02-06 NOTE — ED Provider Notes (Signed)
Patient ambulated without difficulty and maintained oxygen saturations in the 95 to 97% range.   Charlestine Night, PA-C 02/06/18 1612    Linwood Dibbles, MD 02/09/18 [phone]

## 2018-02-06 NOTE — ED Triage Notes (Signed)
Pt reports onset of SOB around 1800, and then upper back pain around 2100. No meds PTA. Pt says that the more she walked, the more short of breath she became. Denies fever.

## 2018-02-06 NOTE — Discharge Instructions (Signed)
As we discussed today, your CT showed no evidence of blood clot.  Take prednisone as directed.  Use inhaler as directed.  Follow-up with [facility] to establish primary care.  Additionally, if you continue to wheezing, you need to follow-up with pulmonologist to get pulmonary function test.  Return the emergency department for any worsening difficulty breathing, chest pain or any other worsening concerning symptoms.

## 2018-02-06 NOTE — ED Notes (Signed)
During ambulation oxygen saturations maintained between 94% and 97%

## 2018-02-06 NOTE — ED Provider Notes (Signed)
[facility]-EMERGENCY DEPT Provider Note   CSN: [id number] Arrival date & time: 02/06/18  0022     History   Chief Complaint Chief Complaint  Patient presents with  . Shortness of Breath    HPI Wendy Gutierrez is a 22 y.o. female headache, vitamin D deficiency who presents for evaluation of shortness of breath that began this evening at approximately 6:30 PM.  Patient states that she was laying down in bed when she noticed that "it became difficult for her to catch a full breath."  She states that she went to work and noticed that when she started walking around, she was having difficulty breathing and stated that the shortness of breath became worse.  Patient states that she has not taken medications.  She came to the ED for further evaluation.  While awaiting in the ED, patient states that she started developing some generalized chest pain.  She describes it as a "tightness."  She states that this tightness was not worse when she walked around but was worse with deep inspiration.  She states she is also having some pain in the upper back.  Patient states that her breathing still does not feel back to normal that has improved.  She denies any history of asthma or COPD.  She denies any smoking.  Patient denies any cocaine or IV drug use.  Patient states that she has not been sick recently with any fevers, cough, nasal congestion, rhinorrhea.  Patient reports that she is on the Depo-Provera shot but denies any OCPs. She denies recent immobilization, prior history of DVT/PE, recent surgery, leg swelling, or long travel.  The history is provided by the patient.    Past Medical History:  Diagnosis Date  . Headache   . Vitamin D deficiency     Patient Active Problem List   Diagnosis Date Noted  . ASCUS of cervix with negative high risk HPV 06/08/2017  . Migraine without aura and without status migrainosus, not intractable 07/24/2014    Past Surgical History:    Procedure Laterality Date  . NO PAST SURGERIES       OB History    Gravida  2   Para  2   Term  2   Preterm      AB      Living  2     SAB      TAB      Ectopic      Multiple  0   Live Births  2            Home Medications    Prior to Admission medications   Medication Sig Start Date End Date Taking? Authorizing Provider  medroxyPROGESTERone (DEPO-PROVERA) 150 MG/ML injection Inject 1 mL (150 mg total) into the muscle every 3 (three) months. 08/27/17  Yes Denney, Rachelle A, CNM  ibuprofen (ADVIL,MOTRIN) 600 MG tablet Take 1 tablet (600 mg total) by mouth every 6 (six) hours. Patient not taking: Reported on 02/06/2018 05/04/17   Oralia Manis, DO  predniSONE (DELTASONE) 20 MG tablet Take 2 tablets (40 mg total) by mouth daily for 4 days. 02/06/18 02/10/18  Maxwell Caul, PA-C  senna-docusate (SENOKOT-S) 8.6-50 MG tablet Take 2 tablets by mouth daily. Patient not taking: Reported on 05/30/2017 05/05/17   Oralia Manis, DO  tinidazole (TINDAMAX) 500 MG tablet Take 4 tablets (2,000 mg total) by mouth daily with breakfast. Patient not taking: Reported on 02/06/2018 06/02/17   Roe Coombs, CNM  Family History Family History  Problem Relation Age of Onset  . Healthy Mother   . Healthy Father   . Breast cancer Maternal Grandmother   . Diabetes Maternal Uncle     Social History Social History   Tobacco Use  . Smoking status: Never Smoker  . Smokeless tobacco: Never Used  Substance Use Topics  . Alcohol use: No  . Drug use: No     Allergies   Patient has no known allergies.   Review of Systems Review of Systems  Constitutional: Negative for fever.  HENT: Negative for congestion.   Respiratory: Positive for chest tightness and shortness of breath. Negative for cough.   Cardiovascular: Negative for chest pain and leg swelling.  Gastrointestinal: Negative for abdominal pain, nausea and vomiting.  Genitourinary: Negative for dysuria and  hematuria.  Neurological: Negative for headaches.  All other systems reviewed and are negative.    Physical Exam Updated Vital Signs BP (!) 125/91   Pulse 90   Temp 98 F (36.7 C) (Oral)   Resp (!) 27   Ht 5\' 7"  (1.702 m)   Wt 94.3 kg   SpO2 99%   BMI 32.58 kg/m   Physical Exam  Constitutional: She is oriented to person, place, and time. She appears well-developed and well-nourished.  Sitting comfortably on examination table  HENT:  Head: Normocephalic and atraumatic.  Mouth/Throat: Oropharynx is clear and moist and mucous membranes are normal.  Eyes: Pupils are equal, round, and reactive to light. Conjunctivae, EOM and lids are normal.  Neck: Full passive range of motion without pain.  Cardiovascular: Normal rate, regular rhythm, normal heart sounds and normal pulses. Exam reveals no gallop and no friction rub.  No murmur heard. Pulmonary/Chest: Effort normal. She has wheezes in the right upper field and the left upper field.  Wheezing noted to bilateral upper lung fields.  No evidence of respiratory distress.  Patient speaking in full sentences without any difficulty.  Abdominal: Soft. Normal appearance. There is no tenderness. There is no rigidity and no guarding.  Musculoskeletal: Normal range of motion.  Neurological: She is alert and oriented to person, place, and time.  Skin: Skin is warm and dry. Capillary refill takes less than 2 seconds.  Psychiatric: She has a normal mood and affect. Her speech is normal.  Nursing note and vitals reviewed.    ED Treatments / Results  Labs (all labs ordered are listed, but only abnormal results are displayed) Labs Reviewed  CBC WITH DIFFERENTIAL/PLATELET - Abnormal; Notable for the following components:      Result Value   MCH 25.5 (*)    All other components within normal limits  BASIC METABOLIC PANEL - Abnormal; Notable for the following components:   CO2 21 (*)    Glucose, Bld 102 (*)    All other components within  normal limits  D-DIMER, QUANTITATIVE (NOT AT [facility]) - Abnormal; Notable for the following components:   D-Dimer, Quant 0.53 (*)    All other components within normal limits  I-STAT BETA HCG BLOOD, ED ([facility], [facility], [facility] ONLY)  I-STAT TROPONIN, ED  POCT I-STAT TROPONIN I  I-STAT BETA HCG BLOOD, ED (NOT ORDERABLE)    EKG EKG Interpretation  Date/Time:  Tuesday February 06 2018 04:34:27 EDT Ventricular Rate:  68 PR Interval:    QRS Duration: 92 QT Interval:  388 QTC Calculation: 413 R Axis:   66 Text Interpretation:  Sinus rhythm ST elev, probable normal early repol pattern Confirmed by Geoffery Lyons ([id number])  on 02/06/2018 4:56:51 AM Also confirmed by Geoffery Lyons ([id number]), editor Elita Quick [phone])  on 02/06/2018 8:12:36 AM   Radiology Dg Chest 2 View  Result Date: 02/06/2018 CLINICAL DATA:  Sudden onset shortness of breath with mid chest pain. EXAM: CHEST - 2 VIEW COMPARISON:  11/24/2013 FINDINGS: The heart size and mediastinal contours are within normal limits. Both lungs are clear. The visualized skeletal structures are unremarkable. IMPRESSION: No active cardiopulmonary disease. Electronically Signed   By: Elberta Fortis M.D.   On: 02/06/2018 02:06   Ct Angio Chest Pe W And/or Wo Contrast  Result Date: 02/06/2018 CLINICAL DATA:  22 year old female foot shortness of breath. Concern for pulmonary embolism. EXAM: CT ANGIOGRAPHY CHEST WITH CONTRAST TECHNIQUE: Multidetector CT imaging of the chest was performed using the standard protocol during bolus administration of intravenous contrast. Multiplanar CT image reconstructions and MIPs were obtained to evaluate the vascular anatomy. CONTRAST:  ISOVUE-370 IOPAMIDOL (ISOVUE-370) INJECTION 76% COMPARISON:  Chest radiograph dated 02/06/2018 FINDINGS: Cardiovascular: There is no cardiomegaly or pericardial effusion. The thoracic aorta is unremarkable. Evaluation of the pulmonary arteries is somewhat limited due to suboptimal opacification  and timing of the contrast as well as respiratory motion artifact. No large or central pulmonary artery embolus identified. Mediastinum/Nodes: No hilar or mediastinal adenopathy. The esophagus and the thyroid gland are grossly unremarkable. No mediastinal fluid collection. Lungs/Pleura: Lungs are clear. No pleural effusion or pneumothorax. Upper Abdomen: No acute abnormality. Musculoskeletal: No chest wall abnormality. No acute or significant osseous findings. Review of the MIP images confirms the above findings. IMPRESSION: No acute intrathoracic pathology. No CT evidence of central pulmonary artery embolus. Electronically Signed   By: Elgie Collard M.D.   On: 02/06/2018 06:36    Procedures Procedures (including critical care time)  Medications Ordered in ED Medications  ipratropium-albuterol (DUONEB) 0.5-2.5 (3) MG/3ML nebulizer solution 3 mL (3 mLs Nebulization Given 02/06/18 0435)  methylPREDNISolone sodium succinate (SOLU-MEDROL) 125 mg/2 mL injection 125 mg (125 mg Intravenous Given 02/06/18 0550)  iopamidol (ISOVUE-370) 76 % injection 100 mL (100 mLs Intravenous Contrast Given 02/06/18 0602)  albuterol (PROVENTIL HFA;VENTOLIN HFA) 108 (90 Base) MCG/ACT inhaler 1 puff (1 puff Inhalation Given 02/06/18 0729)     Initial Impression / Assessment and Plan / ED Course  I have reviewed the triage vital signs and the nursing notes.  Pertinent labs & imaging results that were available during my care of the patient were reviewed by me and considered in my medical decision making (see chart for details).     22 year old female who presents for evaluation of shortness of breath that began this evening at 630.  Reports it is worse with exertion.  Additionally, had some chest tightness and chest pain that she described was worse with deep inspiration.  No history of asthma or COPD.  No smoking.  She is on Depo-Provera but no other risk factors for PE. Patient is afebrile, non-toxic appearing,  sitting comfortably on examination table. Vital signs reviewed and stable.  No evidence of tachycardia or hypoxia.  On exam, she is wheezing noted upper lung fields bilaterally.  No evidence of respiratory distress.  Bilateral lower extremities are symmetric in appearance.  Consider infectious etiology.  Patient does have wheezing noted on exam but has no history of asthma or COPD.  We will plan to give breathing treatment and reevaluate.  Suspicion for ACS allergy but will evaluate with troponin EKG.  Additionally, given pleuritic chest pain, shortness of breath with exertion, need to rule  out PE.  Fortunately, patient cannot be PERC negative as she is on Depo-Provera birth control.  Will plan for d-dimer.  BMP is unremarkable.  Initial troponin negative.  I-STAT beta negative.  CBC without any significant leukocytosis or anemia.  D-dimer is slightly elevated.  Chest x-ray negative for any acute infectious etiology.  Given positive d-dimer, will plan for CTA evaluation to rule out PE.  Reevaluation after breathing treatments.  Patient reports improvement in symptoms.  Wheezing has improved.    CTA shows no evidence of PE.  Discussed results with patient.  She reports improvement in symptoms after breathing treatments here in ED.  Reevaluation shows wheezing has resolved.  We will plan to treat with steroids and albuterol inhaler.  Question if this is bronchitis though she had no preceding symptoms and given the acute nature, wonder if she has underlying asthma disease that is undiagnosed.  We will plan to give her outpatient referral to [facility] for further evaluation. Patient had ample opportunity for questions and discussion. All patient's questions were answered with full understanding. Strict return precautions discussed. Patient expresses understanding and agreement to plan.    Final Clinical Impressions(s) / ED Diagnoses   Final diagnoses:  SOB (shortness of breath)  Bronchitis     ED Discharge Orders         Ordered    predniSONE (DELTASONE) 20 MG tablet  Daily     02/06/18 0656           Maxwell Caul, PA-C 02/07/18 9147    Geoffery Lyons, MD 02/07/18 [phone]

## 2018-04-16 ENCOUNTER — Ambulatory Visit (INDEPENDENT_AMBULATORY_CARE_PROVIDER_SITE_OTHER): Payer: Self-pay

## 2018-04-16 VITALS — BP 103/69 | HR 73 | Resp 16 | Ht 67.0 in | Wt 213.6 lb

## 2018-04-16 DIAGNOSIS — Z3042 Encounter for surveillance of injectable contraceptive: Secondary | ICD-10-CM

## 2018-04-16 MED ORDER — MEDROXYPROGESTERONE ACETATE 150 MG/ML IM SUSP
150.0000 mg | Freq: Once | INTRAMUSCULAR | Status: AC
  Administered 2018-04-16: 150 mg via INTRAMUSCULAR

## 2018-04-16 NOTE — Progress Notes (Signed)
I have reviewed the chart and agree with nursing staff's documentation of this patient's encounter.  Catalina Antigua, MD 04/16/2018 3:39 PM

## 2018-04-16 NOTE — Progress Notes (Signed)
After obtaining consent, and per orders of Dr. Catalina AntiguaPeggy Constant, injection of Depo Provera was administered by Riley NearingMorehead, Modestine Scherzinger Byrd. Last injection: 03/31/18 - RUOQ. Window: 01/6 - 04/30/18. Patient is in window to receive injection. Patient reports no complaints since last injection. Patient tolerated injection - LUOQ, well with no complaints. Patient advised to schedule next Depo Provera injection between 03/24 - 07/17/18. Patient stated she understood and had no further questions.Patient instructed to remain in clinic for 20 minutes afterwards, and to report any adverse reaction to me immediately.

## 2018-07-09 ENCOUNTER — Ambulatory Visit: Payer: BLUE CROSS/BLUE SHIELD

## 2018-07-10 ENCOUNTER — Ambulatory Visit (INDEPENDENT_AMBULATORY_CARE_PROVIDER_SITE_OTHER): Payer: BC Managed Care – PPO

## 2018-07-10 ENCOUNTER — Other Ambulatory Visit: Payer: Self-pay

## 2018-07-10 DIAGNOSIS — Z30013 Encounter for initial prescription of injectable contraceptive: Secondary | ICD-10-CM

## 2018-07-10 DIAGNOSIS — Z3042 Encounter for surveillance of injectable contraceptive: Secondary | ICD-10-CM | POA: Diagnosis not present

## 2018-07-10 MED ORDER — MEDROXYPROGESTERONE ACETATE 150 MG/ML IM SUSP: 150.0000 mg | INTRAMUSCULAR | 1 refills | Status: DC

## 2018-07-10 MED ORDER — MEDROXYPROGESTERONE ACETATE 150 MG/ML IM SUSP
150.0000 mg | INTRAMUSCULAR | Status: DC
  Administered 2018-07-10 – 2018-10-08 (×2): 150 mg via INTRAMUSCULAR

## 2018-07-10 NOTE — Progress Notes (Signed)
Pt is in the office for depo injection, administered in LUOQ and pt tolerated well. Next due 6/16- 6/30 .Marland Kitchen Administrations This Visit    medroxyPROGESTERone (DEPO-PROVERA) injection 150 mg    Admin Date 07/10/2018 Action Given Dose 150 mg Route Intramuscular Administered By Katrina Stack, RN

## 2018-07-11 NOTE — Progress Notes (Signed)
I have reviewed this chart and agree with the RN/CMA assessment and management.    K. Meryl Davis, M.D. Attending [facility] (Faculty Practice)   

## 2018-08-27 ENCOUNTER — Telehealth: Payer: Self-pay | Admitting: *Deleted

## 2018-08-27 NOTE — Telephone Encounter (Signed)
Pt called to office stating she had questions about cycles on Depo.  Return call to pt.  Pt states she has had some spotting while on depo but this month she had regular cycle. Pt wants to know if this is normal. Pt made aware that she may have cycles while on Depo.  Pt advised she may have irregularity in cycles. Pt advised if she is concerned about STD exposure or if she has irregular bleeding in between cycles she may call office for appt.  Pt made aware she is due for [facility].  Pt advised to call office periodically to check on scheduling.

## 2018-10-01 ENCOUNTER — Ambulatory Visit: Payer: BC Managed Care – PPO

## 2018-10-01 ENCOUNTER — Other Ambulatory Visit: Payer: Self-pay

## 2018-10-01 DIAGNOSIS — Z30013 Encounter for initial prescription of injectable contraceptive: Secondary | ICD-10-CM

## 2018-10-01 MED ORDER — MEDROXYPROGESTERONE ACETATE 150 MG/ML IM SUSP: 150.0000 mg | INTRAMUSCULAR | 1 refills | Status: DC

## 2018-10-01 NOTE — Progress Notes (Signed)
Rx for DEPO sent to [facility] per patients request.  Patient Needs Annual Exam, additional refills will be done at [facility].

## 2018-10-08 ENCOUNTER — Ambulatory Visit (INDEPENDENT_AMBULATORY_CARE_PROVIDER_SITE_OTHER): Payer: BC Managed Care – PPO

## 2018-10-08 ENCOUNTER — Other Ambulatory Visit: Payer: Self-pay

## 2018-10-08 DIAGNOSIS — Z3042 Encounter for surveillance of injectable contraceptive: Secondary | ICD-10-CM | POA: Diagnosis not present

## 2018-10-08 NOTE — Progress Notes (Signed)
Nurse visit for Depo. Pt is within her window. Depo given RUOQ without difficulty. Next Depo due Sept 14-28, pt agrees.

## 2018-10-11 NOTE — Progress Notes (Signed)
Patient ID: Wendy Gutierrez, female   DOB: 1995/08/06, 23 y.o.   MRN: [id number] I have reviewed the chart and agree with nursing staff's documentation of this patient's encounter.  Emeterio Reeve, MD 10/11/2018 10:32 AM

## 2019-01-01 ENCOUNTER — Ambulatory Visit: Payer: BC Managed Care – PPO

## 2019-01-03 ENCOUNTER — Ambulatory Visit: Payer: BC Managed Care – PPO

## 2019-01-07 ENCOUNTER — Other Ambulatory Visit: Payer: Self-pay

## 2019-01-07 ENCOUNTER — Ambulatory Visit (INDEPENDENT_AMBULATORY_CARE_PROVIDER_SITE_OTHER): Payer: BC Managed Care – PPO

## 2019-01-07 VITALS — BP 102/69 | HR 68 | Ht 67.0 in | Wt 210.0 lb

## 2019-01-07 DIAGNOSIS — Z3042 Encounter for surveillance of injectable contraceptive: Secondary | ICD-10-CM

## 2019-01-07 MED ORDER — MEDROXYPROGESTERONE ACETATE 150 MG/ML IM SUSP
150.0000 mg | Freq: Once | INTRAMUSCULAR | Status: AC
  Administered 2019-01-07: 150 mg via INTRAMUSCULAR

## 2019-01-07 NOTE — Progress Notes (Signed)
Presents for DEPO Injection, given in LUOQ, tolerated well.  Needs Annual Exam before Next DEPO 12/14-28/2020   Administrations This Visit    medroxyPROGESTERone (DEPO-PROVERA) injection 150 mg    Admin Date 01/07/2019 Action Given Dose 150 mg Route Intramuscular Administered By Tamela Oddi, RMA

## 2019-02-18 ENCOUNTER — Ambulatory Visit: Payer: BC Managed Care – PPO | Admitting: Advanced Practice Midwife

## 2019-02-19 ENCOUNTER — Ambulatory Visit: Payer: BC Managed Care – PPO | Admitting: Advanced Practice Midwife

## 2019-03-11 ENCOUNTER — Ambulatory Visit: Payer: BC Managed Care – PPO | Admitting: Advanced Practice Midwife

## 2019-03-11 DIAGNOSIS — Z01419 Encounter for gynecological examination (general) (routine) without abnormal findings: Secondary | ICD-10-CM

## 2019-04-01 ENCOUNTER — Ambulatory Visit: Payer: BC Managed Care – PPO

## 2019-04-08 ENCOUNTER — Ambulatory Visit: Payer: BC Managed Care – PPO

## 2019-04-30 ENCOUNTER — Other Ambulatory Visit (HOSPITAL_COMMUNITY)
Admission: RE | Admit: 2019-04-30 | Discharge: 2019-04-30 | Disposition: A | Discharge status: Home / Self Care | Payer: BC Managed Care – PPO | Source: Ambulatory Visit | Attending: Advanced Practice Midwife | Admitting: Advanced Practice Midwife

## 2019-04-30 ENCOUNTER — Ambulatory Visit (INDEPENDENT_AMBULATORY_CARE_PROVIDER_SITE_OTHER): Payer: BC Managed Care – PPO | Admitting: Advanced Practice Midwife

## 2019-04-30 ENCOUNTER — Encounter: Payer: Self-pay | Admitting: Advanced Practice Midwife

## 2019-04-30 ENCOUNTER — Other Ambulatory Visit: Payer: Self-pay

## 2019-04-30 VITALS — BP 130/82 | HR 77 | Wt 219.2 lb

## 2019-04-30 DIAGNOSIS — Z01419 Encounter for gynecological examination (general) (routine) without abnormal findings: Secondary | ICD-10-CM | POA: Diagnosis not present

## 2019-04-30 DIAGNOSIS — Z113 Encounter for screening for infections with a predominantly sexual mode of transmission: Secondary | ICD-10-CM | POA: Insufficient documentation

## 2019-04-30 DIAGNOSIS — Z124 Encounter for screening for malignant neoplasm of cervix: Secondary | ICD-10-CM

## 2019-04-30 DIAGNOSIS — Z3009 Encounter for other general counseling and advice on contraception: Secondary | ICD-10-CM

## 2019-04-30 DIAGNOSIS — R87610 Atypical squamous cells of undetermined significance on cytologic smear of cervix (ASC-US): Secondary | ICD-10-CM

## 2019-04-30 DIAGNOSIS — Z3202 Encounter for pregnancy test, result negative: Secondary | ICD-10-CM | POA: Diagnosis not present

## 2019-04-30 DIAGNOSIS — R5382 Chronic fatigue, unspecified: Secondary | ICD-10-CM

## 2019-04-30 LAB — POCT URINE PREGNANCY: Preg Test, Ur: NEGATIVE

## 2019-04-30 MED ORDER — NORGESTIMATE-ETH ESTRADIOL 0.25-35 MG-MCG PO TABS: 1.0000 | ORAL_TABLET | Freq: Every day | ORAL | 11 refills | Status: DC

## 2019-04-30 NOTE — Patient Instructions (Signed)
Contraception Choices Contraception, also called birth control, refers to methods or devices that prevent pregnancy. Hormonal methods Contraceptive implant  A contraceptive implant is a thin, plastic tube that contains a hormone. It is inserted into the upper part of the arm. It can remain in place for up to 3 years. Progestin-only injections Progestin-only injections are injections of progestin, a synthetic form of the hormone progesterone. They are given every 3 months by a health care provider. Birth control pills  Birth control pills are pills that contain hormones that prevent pregnancy. They must be taken once a day, preferably at the same time each day. Birth control patch  The birth control patch contains hormones that prevent pregnancy. It is placed on the skin and must be changed once a week for three weeks and removed on the fourth week. A prescription is needed to use this method of contraception. Vaginal ring  A vaginal ring contains hormones that prevent pregnancy. It is placed in the vagina for three weeks and removed on the fourth week. After that, the process is repeated with a new ring. A prescription is needed to use this method of contraception. Emergency contraceptive Emergency contraceptives prevent pregnancy after unprotected sex. They come in pill form and can be taken up to 5 days after sex. They work best the sooner they are taken after having sex. Most emergency contraceptives are available without a prescription. This method should not be used as your only form of birth control. Barrier methods Female condom  A female condom is a thin sheath that is worn over the penis during sex. Condoms keep sperm from going inside a woman's body. They can be used with a spermicide to increase their effectiveness. They should be disposed after a single use. Female condom  A female condom is a soft, loose-fitting sheath that is put into the vagina before sex. The condom keeps sperm  from going inside a woman's body. They should be disposed after a single use. Diaphragm  A diaphragm is a soft, dome-shaped barrier. It is inserted into the vagina before sex, along with a spermicide. The diaphragm blocks sperm from entering the uterus, and the spermicide kills sperm. A diaphragm should be left in the vagina for 6-8 hours after sex and removed within 24 hours. A diaphragm is prescribed and fitted by a health care provider. A diaphragm should be replaced every 1-2 years, after giving birth, after gaining more than 15 lb (6.8 kg), and after pelvic surgery. Cervical cap  A cervical cap is a round, soft latex or plastic cup that fits over the cervix. It is inserted into the vagina before sex, along with spermicide. It blocks sperm from entering the uterus. The cap should be left in place for 6-8 hours after sex and removed within 48 hours. A cervical cap must be prescribed and fitted by a health care provider. It should be replaced every 2 years. Sponge  A sponge is a soft, circular piece of polyurethane foam with spermicide on it. The sponge helps block sperm from entering the uterus, and the spermicide kills sperm. To use it, you make it wet and then insert it into the vagina. It should be inserted before sex, left in for at least 6 hours after sex, and removed and thrown away within 30 hours. Spermicides Spermicides are chemicals that kill or block sperm from entering the cervix and uterus. They can come as a cream, jelly, suppository, foam, or tablet. A spermicide should be inserted into the   vagina with an applicator at least 10-15 minutes before sex to allow time for it to work. The process must be repeated every time you have sex. Spermicides do not require a prescription. Intrauterine contraception Intrauterine device (IUD) An IUD is a T-shaped device that is put in a woman's uterus. There are two types:  Hormone IUD.This type contains progestin, a synthetic form of the hormone  progesterone. This type can stay in place for 3-5 years.  Copper IUD.This type is wrapped in copper wire. It can stay in place for 10 years.  Permanent methods of contraception Female tubal ligation In this method, a woman's fallopian tubes are sealed, tied, or blocked during surgery to prevent eggs from traveling to the uterus. Hysteroscopic sterilization In this method, a small, flexible insert is placed into each fallopian tube. The inserts cause scar tissue to form in the fallopian tubes and block them, so sperm cannot reach an egg. The procedure takes about 3 months to be effective. Another form of birth control must be used during those 3 months. Female sterilization This is a procedure to tie off the tubes that carry sperm (vasectomy). After the procedure, the man can still ejaculate fluid (semen). Natural planning methods Natural family planning In this method, a couple does not have sex on days when the woman could become pregnant. Calendar method This means keeping track of the length of each menstrual cycle, identifying the days when pregnancy can happen, and not having sex on those days. Ovulation method In this method, a couple avoids sex during ovulation. Symptothermal method This method involves not having sex during ovulation. The woman typically checks for ovulation by watching changes in her temperature and in the consistency of cervical mucus. Post-ovulation method In this method, a couple waits to have sex until after ovulation. Summary  Contraception, also called birth control, means methods or devices that prevent pregnancy.  Hormonal methods of contraception include implants, injections, pills, patches, vaginal rings, and emergency contraceptives.  Barrier methods of contraception can include female condoms, female condoms, diaphragms, cervical caps, sponges, and spermicides.  There are two types of IUDs (intrauterine devices). An IUD can be put in a woman's uterus to  prevent pregnancy for 3-5 years.  Permanent sterilization can be done through a procedure for males, females, or both.  Natural family planning methods involve not having sex on days when the woman could become pregnant. This information is not intended to replace advice given to you by your health care provider. Make sure you discuss any questions you have with your health care provider. Document Revised: 03/30/2017 Document Reviewed: 04/30/2016 Elsevier Patient Education  2020 [employer]  

## 2019-04-30 NOTE — Progress Notes (Signed)
poct Pt presents for annual, pap, and all STD's. Pt didn't receive Depo last month; requests BTL. Pt feeling fatigue and requests iron check.  05/30/2017 ASCUS HPV pap

## 2019-04-30 NOTE — Progress Notes (Signed)
GYNECOLOGY PROGRESS NOTE  History:  24 y.o. [id number] presents to [facility] office today for problem gyn visit. She reports no gyn concerns but has fatigue x 2-3 months and does not desire to continue Depo Provera for contraception due to side effects.  She reports that she is working extra hours at her [employer] job and this may be causing her fatigue but she wants to test for anemia.  She reports that Depo was causing thinning hair and increased appetite/weight gain so she did not get her last injection, due in December.  She has not had a period since stopping the Depo, LMP in November.  She denies h/a, dizziness, shortness of breath, n/v, or fever/chills.    The following portions of the patient's history were reviewed and updated as appropriate: allergies, current medications, past family history, past medical history, past social history, past surgical history and problem list. Last pap smear on 05/30/17 was abnormal with ASCUS and positive HPV so repeat Pap in 1 year was recommended per ASCCP guidelines under age 70.    Review of Systems:  Pertinent items are noted in HPI.   Objective:  Physical Exam Blood pressure 130/82, pulse 77, weight 219 lb 3.2 oz (99.4 kg), last menstrual period 03/11/2019, not currently breastfeeding. VS reviewed, nursing note reviewed,  Constitutional: well developed, well nourished, no distress HEENT: normocephalic CV: normal rate Pulm/chest wall: normal effort Breast Exam: deferred Abdomen: soft Neuro: alert and oriented x 3 Skin: warm, dry Psych: affect normal Pelvic exam: Cervix pink, visually closed, without lesion, small amount thin white frothy discharge, vaginal walls and external genitalia normal Bimanual exam: Cervix 0/long/high, firm, anterior, neg CMT, uterus nontender, nonenlarged, adnexa without tenderness, enlargement, or mass  Assessment & Plan:   1. Chronic fatigue --Pt reports working a lot of hours recently at her [employer] job so fatigue  may be related to that but is concerned about anemia.  --Thyroid exam today wnl - CBC - TSH  2. Well woman exam with routine gynecological exam --No gyn concerns, irregular light menses with Depo, last period in November  3. Screen for STD (sexually transmitted disease) - Cervicovaginal ancillary only( [place]) - Hepatitis B surface antigen - HIV Antibody (routine testing w rflx) - Hepatitis C antibody - RPR  4. Cervical cancer screening --Pap in 2019 with ASCUS and positive HPV, no high risk strains --Repeat Pap this year - Cytology - PAP( )  5. Encounter for counseling regarding contraception --Pt did not get Depo injection as scheduled in December, has thinning hair, increased appetite/weight gain.  Does not want to resume Depo. --UPT negative today --Discussed LARCs as most effective forms of birth control.  Discussed benefits/risks of other methods.  Pt is considering BTL but is undecided.  Discussed temporary options until pt decides or until BTL is completed.  Pt selects OCPs. --Appt in the next month with MD for preop to discuss BTL --Reviewed risk factors and s/sx of DVT/PE/reasons to seek medical care.  Rx for Sprintec sent to pharmacy.  - POCT urine pregnancy - norgestimate-ethinyl estradiol (ORTHO-CYCLEN) 0.25-35 MG-MCG tablet; Take 1 tablet by mouth daily.  Dispense: 1 Package; Refill: 11  6. ASCUS of cervix with negative high risk HPV --ASCUS with HPV at last Pap, pt was under 25 so recommendation to repeat Pap --If normal cytology today, repeat again in 1 year, then routine if normal --If ASCUS or higher today, colposcopy per ASCCP guidelines     Sharen Counter, CNM 11:35 AM

## 2019-05-01 LAB — CBC
Hematocrit: 46.1 % (ref 34.0–46.6)
Hemoglobin: 14.7 g/dL (ref 11.1–15.9)
MCH: 25.8 pg — ABNORMAL LOW (ref 26.6–33.0)
MCHC: 31.9 g/dL (ref 31.5–35.7)
MCV: 81 fL (ref 79–97)
Platelets: 260 10*3/uL (ref 150–450)
RBC: 5.7 x10E6/uL — ABNORMAL HIGH (ref 3.77–5.28)
RDW: 12.9 % (ref 11.7–15.4)
WBC: 7.3 10*3/uL (ref 3.4–10.8)

## 2019-05-01 LAB — CYTOLOGY - PAP: Diagnosis: NEGATIVE

## 2019-05-01 LAB — HEPATITIS B SURFACE ANTIGEN: Hepatitis B Surface Ag: NEGATIVE

## 2019-05-01 LAB — HEPATITIS C ANTIBODY: Hep C Virus Ab: <0.1 s/co ratio (ref 0.0–0.9)

## 2019-05-01 LAB — CERVICOVAGINAL ANCILLARY ONLY
Chlamydia: NEGATIVE
Comment: NEGATIVE
Comment: NEGATIVE
Comment: NORMAL
Neisseria Gonorrhea: NEGATIVE
Trichomonas: NEGATIVE

## 2019-05-01 LAB — HIV ANTIBODY (ROUTINE TESTING W REFLEX): HIV Screen 4th Generation wRfx: NONREACTIVE

## 2019-05-01 LAB — TSH: TSH: 0.933 u[IU]/mL (ref 0.450–4.500)

## 2019-05-01 LAB — RPR: RPR Ser Ql: NONREACTIVE

## 2019-05-27 ENCOUNTER — Institutional Professional Consult (permissible substitution): Payer: BC Managed Care – PPO | Admitting: Obstetrics and Gynecology

## 2019-06-10 ENCOUNTER — Institutional Professional Consult (permissible substitution): Payer: BC Managed Care – PPO | Admitting: Obstetrics and Gynecology

## 2019-07-09 DIAGNOSIS — M47816 Spondylosis without myelopathy or radiculopathy, lumbar region: Secondary | ICD-10-CM | POA: Insufficient documentation

## 2019-07-11 IMAGING — US US MFM OB FOLLOW-UP
1 series · 14 of 28 positions shown · non-contrast
Comparison: none

[Series 1: us mfm ob follow-up · 68 acquisitions, 14 frames shown]
[im 3/68]
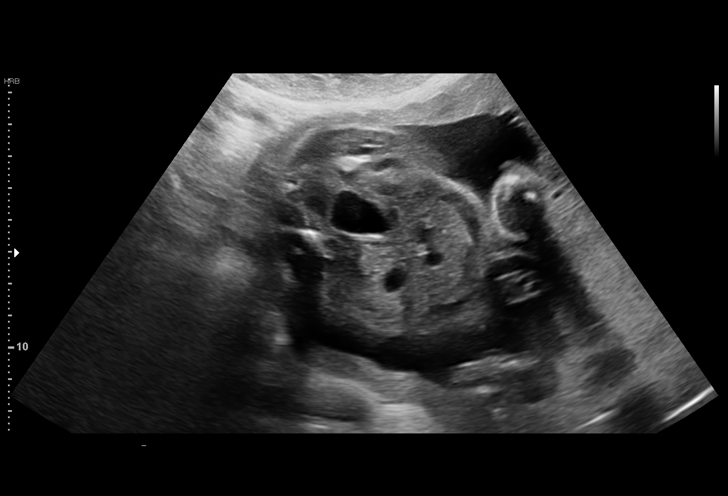
[im 8/68]
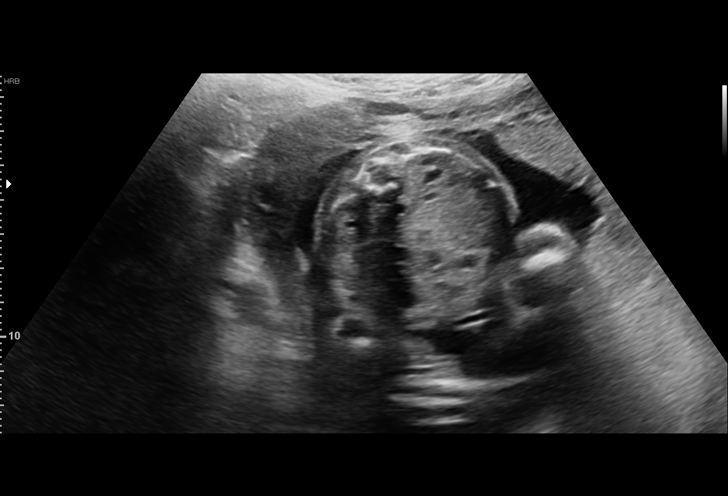
[im 13/68]
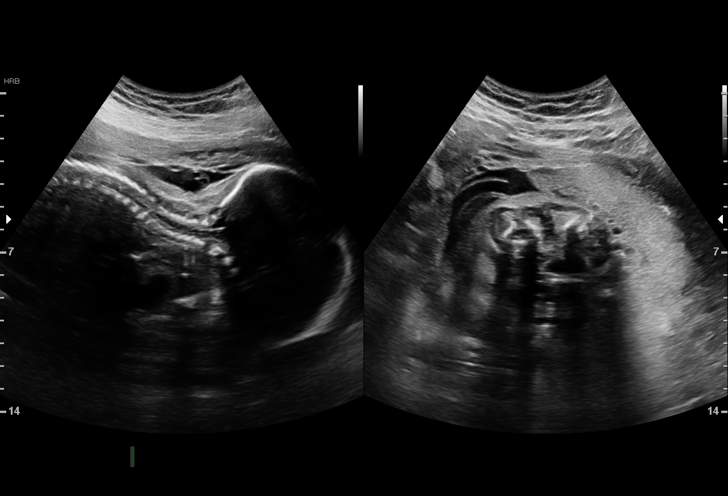
[im 18/68]
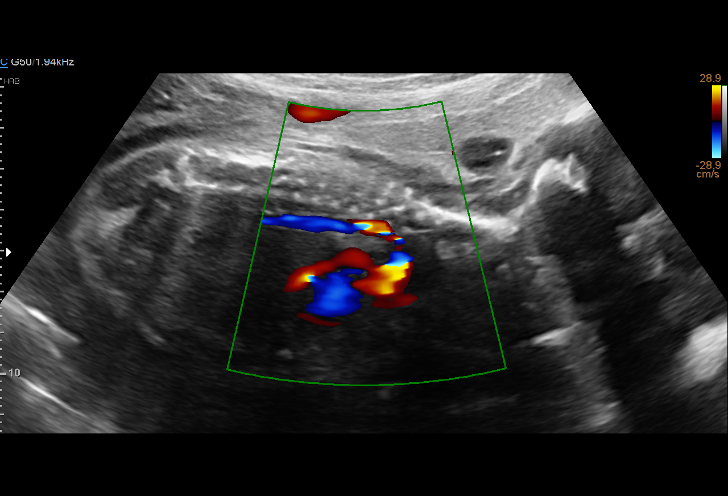
[im 23/68]
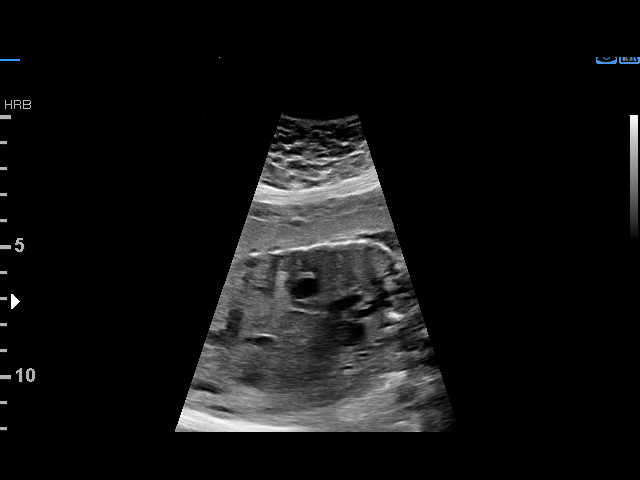
[im 28/68]
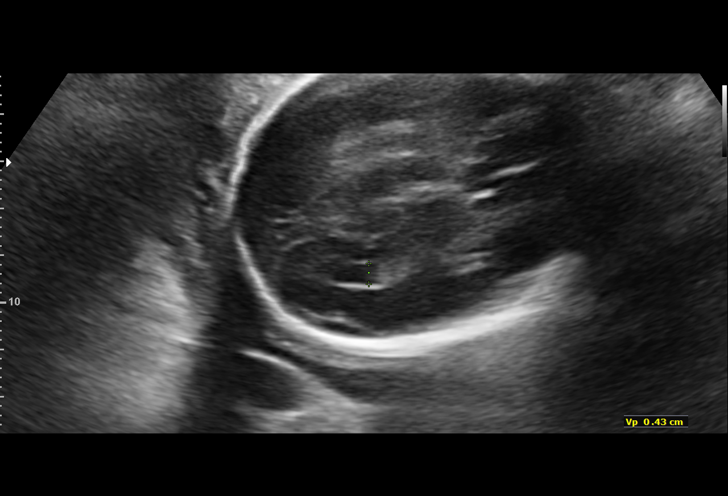
[im 33/68]
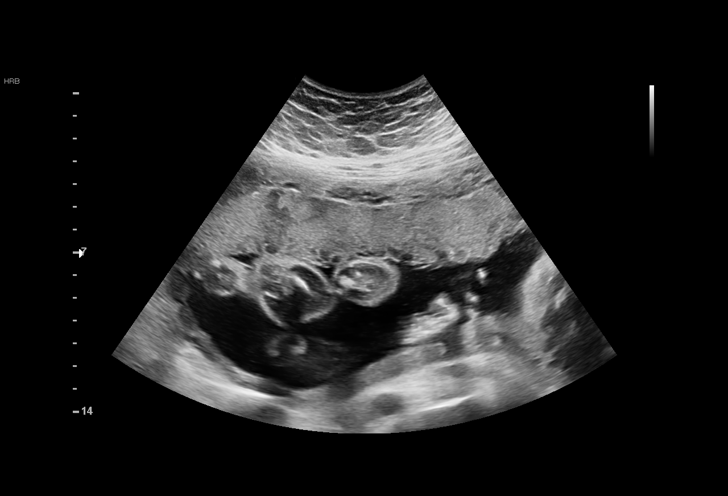
[im 38/68]
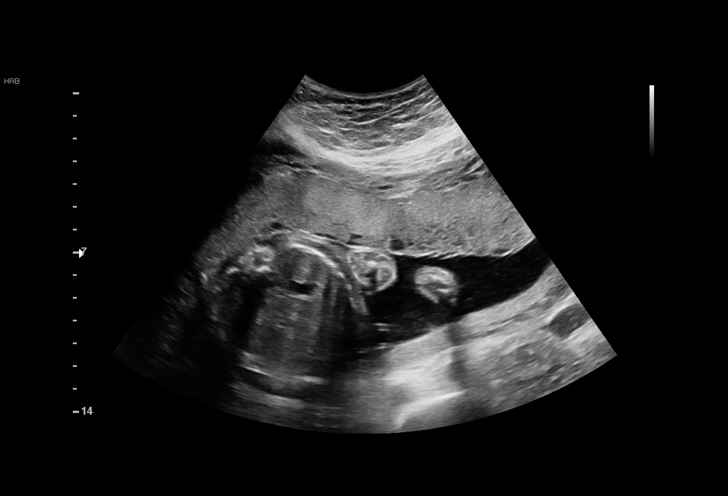
[im 43/68]
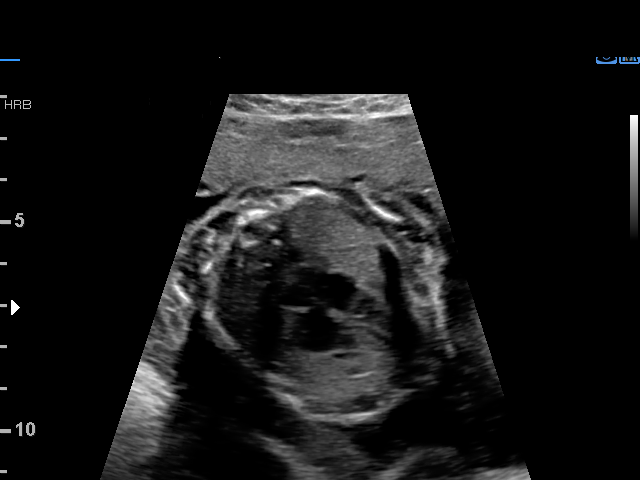
[im 48/68]
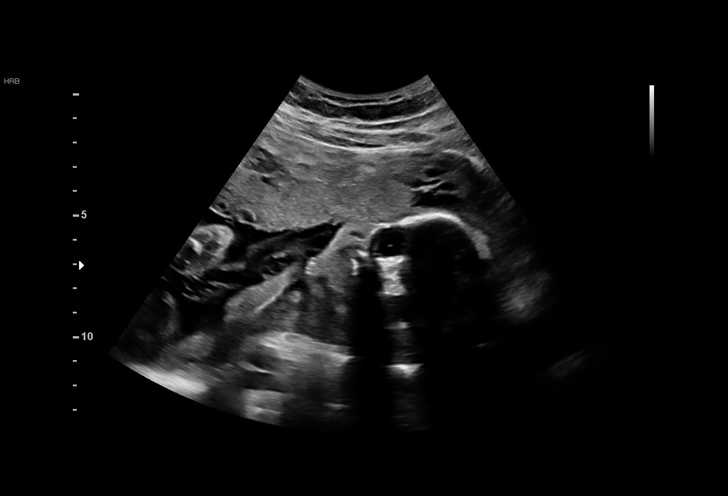
[im 53/68]
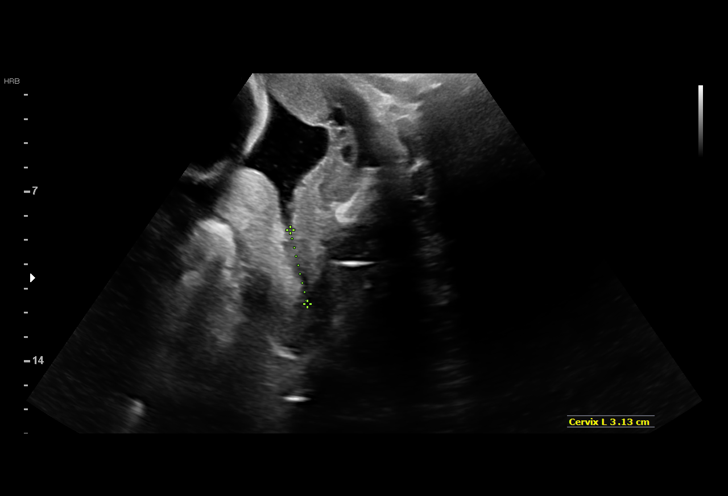
[im 58/68]
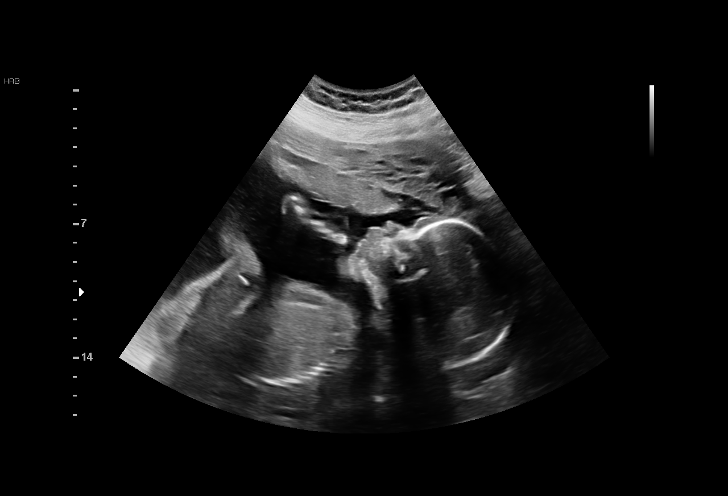
[im 63/68]
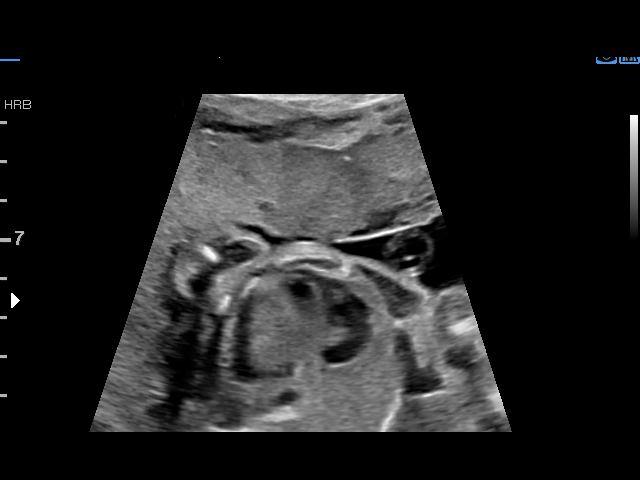
[im 68/68]
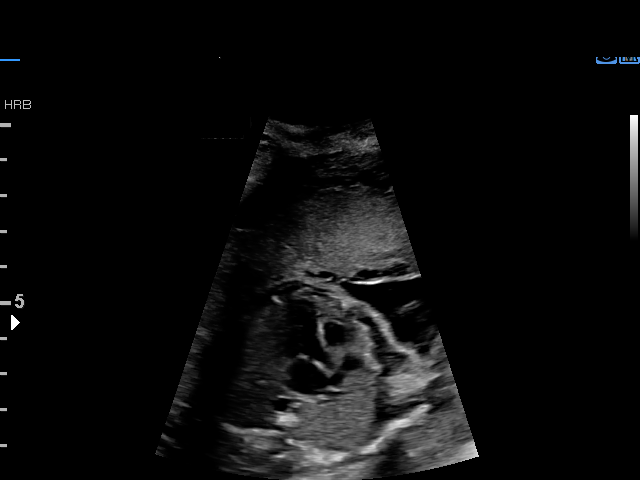

[14 of 28 positions shown; findings below may reference images not displayed]

1  NYA JUMPER             [id number]      [id number]     [id number]
Indications

25 weeks gestation of pregnancy
Obesity complicating pregnancy, second
trimester
Encounter for other antenatal screening
follow-up
OB History

Blood Type:            Height:  5'5"   Weight (lb):  204       BMI:
Gravidity:    2         Term:   1
Living:       1
Fetal Evaluation

Num Of Fetuses:     1
Fetal Heart         150
Rate(bpm):
Cardiac Activity:   Observed
Presentation:       Cephalic
Placenta:           Anterior, above cervical os
P. Cord Insertion:  Previously Visualized

Amniotic Fluid
AFI FV:      Subjectively within normal limits

Largest Pocket(cm)
5.52
Biometry

BPD:      62.8  mm     G. Age:  25w 3d         53  %    CI:        70.63   %    70 - 86
FL/HC:      19.2   %    18.7 -
HC:      238.2  mm     G. Age:  25w 6d         55  %    HC/AC:      1.12        1.04 -
AC:      213.5  mm     G. Age:  25w 6d         63  %    FL/BPD:     72.9   %    71 - 87
FL:       45.8  mm     G. Age:  25w 2d         38  %    FL/AC:      21.5   %    20 - 24
HUM:      44.2  mm     G. Age:  26w 2d         72  %

Est. FW:     829  gm    1 lb 13 oz      62  %
Gestational Age

LMP:           25w 1d        Date:  08/01/16                 EDD:   05/08/17
U/S Today:     25w 4d                                        EDD:   05/05/17
Best:          25w 1d     Det. By:  LMP  (08/01/16)          EDD:   05/08/17
Anatomy

Cranium:               Appears normal         Aortic Arch:            Appears normal
Cavum:                 Appears normal         Ductal Arch:            Previously seen
Ventricles:            Appears normal         Diaphragm:              Appears normal
Choroid Plexus:        Appears normal         Stomach:                Appears normal, left
sided
Cerebellum:            Appears normal         Abdomen:                Appears normal
Posterior Fossa:       Appears normal         Abdominal Wall:         Previously seen
Nuchal Fold:           Appears normal         Cord Vessels:           Previously seen
Face:                  Appears normal         Kidneys:                Appear normal
(orbits and profile)
Lips:                  Appears normal         Bladder:                Appears normal
Thoracic:              Appears normal         Spine:                  Appears normal
Heart:                 Abnormal, see          Upper Extremities:      Previously seen
comments
RVOT:                  Previously seen        Lower Extremities:      Previously seen
LVOT:                  Abnormal, see
comments

Other:  Fetus appears to be a male. Heels prev. visualized.  Technically
difficult due to maternal habitus and fetal position. Nasal bone
visualized.
Cervix Uterus Adnexa

Cervix
Length:            3.1  cm.
Persistant LUS Contraction

Uterus
No abnormality visualized.

Left Ovary
Within normal limits.

Right Ovary
Within normal limits.

Cul De Sac:   No free fluid seen.
Impression

[place] intrauterine pregnancy at 25 weeks 1 day gestation
with fetal cardiac activity
Cephalic presentation
Anterior placenta without evidence of previa
Normal appearing fetal growth and amniotic fluid volume
Limited fetal anatomic survey secondary to maternal habitus
and fetal position
Normal appearing cervical length
Recommendations

Referral to pediatric cardiology for fetal echocardiography
Recommend follow-up ultrasound examination in 
 4 weeks

## 2019-11-10 DIAGNOSIS — U071 COVID-19: Secondary | ICD-10-CM

## 2019-11-10 HISTORY — DX: COVID-19: U07.1

## 2020-08-20 ENCOUNTER — Other Ambulatory Visit (HOSPITAL_COMMUNITY)
Admission: RE | Admit: 2020-08-20 | Discharge: 2020-08-20 | Disposition: A | Discharge status: Home / Self Care | Payer: BC Managed Care – PPO | Source: Ambulatory Visit | Attending: Obstetrics and Gynecology | Admitting: Obstetrics and Gynecology

## 2020-08-20 ENCOUNTER — Other Ambulatory Visit: Payer: Self-pay

## 2020-08-20 ENCOUNTER — Encounter (HOSPITAL_BASED_OUTPATIENT_CLINIC_OR_DEPARTMENT_OTHER): Payer: Self-pay | Admitting: Obstetrics and Gynecology

## 2020-08-20 DIAGNOSIS — O074 Failed attempted termination of pregnancy without complication: Secondary | ICD-10-CM | POA: Diagnosis present

## 2020-08-20 DIAGNOSIS — Z01812 Encounter for preprocedural laboratory examination: Secondary | ICD-10-CM | POA: Insufficient documentation

## 2020-08-20 DIAGNOSIS — Z8616 Personal history of COVID-19: Secondary | ICD-10-CM | POA: Diagnosis not present

## 2020-08-20 DIAGNOSIS — Z20822 Contact with and (suspected) exposure to covid-19: Secondary | ICD-10-CM | POA: Insufficient documentation

## 2020-08-20 DIAGNOSIS — Z880 Allergy status to penicillin: Secondary | ICD-10-CM | POA: Diagnosis not present

## 2020-08-20 DIAGNOSIS — Z791 Long term (current) use of non-steroidal anti-inflammatories (NSAID): Secondary | ICD-10-CM | POA: Diagnosis not present

## 2020-08-20 DIAGNOSIS — Z79899 Other long term (current) drug therapy: Secondary | ICD-10-CM | POA: Diagnosis not present

## 2020-08-20 NOTE — H&P (Signed)
Wendy Gutierrez is an 25 y.o. female [phone] with RPOC after TAB.  D/W pt r/b/a, process and expectations of surgery.  Pt with TAB in 3/22.  Has had persistent bleeding since procedure.    Pertinent Gynecological History: OB History: G3, P2012 SVD x 2, TAB x 1 -  RPOC by [place]  No STD H/o abnormal pap, last WNL 04/2017   Menstrual History:  Patient's last menstrual period was 04/22/2020.    Past Medical History:  Diagnosis Date  . COVID 11/2019   fever x 2 days all symptoms resolved  seasonal allergies  Past Surgical History:  Procedure Laterality Date  . NO PAST SURGERIES      History reviewed. No pertinent family history.  Social History:  reports that she has never smoked. She has never used smokeless tobacco. She reports previous alcohol use. She reports previous drug use. single, stocker at [employer]  Allergies:Amoxicillin  Meds: Ibuprofen, Nystatin, Triamcinolone  Review of Systems  Constitutional: Negative.   HENT: Negative.   Respiratory: Negative.   Cardiovascular: Negative.   Gastrointestinal: Negative.   Genitourinary: Negative.   Musculoskeletal: Negative.   Skin: Negative.   Neurological: Negative.   Psychiatric/Behavioral: Negative.     Height 5\' 7"  (1.702 m), weight 95.3 kg, last menstrual period 04/22/2020. Physical Exam Constitutional:      Appearance: Normal appearance. She is normal weight.  HENT:     Head: Normocephalic and atraumatic.  Cardiovascular:     Rate and Rhythm: Normal rate and regular rhythm.  Pulmonary:     Effort: Pulmonary effort is normal.     Breath sounds: Normal breath sounds.  Abdominal:     General: Bowel sounds are normal.     Palpations: Abdomen is soft.  Musculoskeletal:        General: Normal range of motion.  Skin:    General: Skin is warm and dry.  Neurological:     General: No focal deficit present.     Mental Status: She is oriented to person, place, and time.  Psychiatric:        Mood and Affect: Mood  normal.        Behavior: Behavior normal.    Nl uterus, w RPOC, nl ovaries  Assessment/Plan: 25yo 06/20/2020 w RPOC after TAB D/w pt R/b/a of suction D&C doxyxycline before and after procedure  Wendy Gutierrez 08/20/2020, 2:44 PM

## 2020-08-20 NOTE — Progress Notes (Signed)
Spoke w/ via phone for pre-op interview---pt Lab needs dos---- none (per anesthesia) surgery orders pending              Lab results------none COVID test ------[id number] [phone] Arrive at -------720 am 08-21-2020 NPO after MN NO Solid Food.  Clear liquids from MN until---620 am then npo Med rec completed Medications to take morning of surgery -----none Diabetic medication -----none Patient instructed to bring photo id and insurance card day of surgery Patient aware to have Driver (ride ) / caregiver    for 24 hours after surgery  Patient Special Instructions -----none Pre-Op special Istructions -----none Patient verbalized understanding of instructions that were given at this phone interview. Patient denies shortness of breath, chest pain, fever, cough at this phone interview.

## 2020-08-21 ENCOUNTER — Ambulatory Visit (HOSPITAL_BASED_OUTPATIENT_CLINIC_OR_DEPARTMENT_OTHER): Payer: BC Managed Care – PPO | Admitting: Anesthesiology

## 2020-08-21 ENCOUNTER — Encounter (HOSPITAL_BASED_OUTPATIENT_CLINIC_OR_DEPARTMENT_OTHER): Payer: Self-pay | Admitting: Obstetrics and Gynecology

## 2020-08-21 ENCOUNTER — Encounter (HOSPITAL_BASED_OUTPATIENT_CLINIC_OR_DEPARTMENT_OTHER)
Admission: RE | Disposition: A | Discharge status: Home / Self Care | Payer: Self-pay | Source: Home / Self Care | Attending: Obstetrics and Gynecology

## 2020-08-21 ENCOUNTER — Ambulatory Visit (HOSPITAL_BASED_OUTPATIENT_CLINIC_OR_DEPARTMENT_OTHER)
Admission: RE | Admit: 2020-08-21 | Discharge: 2020-08-21 | Disposition: A | Discharge status: Home / Self Care | Payer: BC Managed Care – PPO | Attending: Obstetrics and Gynecology | Admitting: Obstetrics and Gynecology

## 2020-08-21 DIAGNOSIS — O074 Failed attempted termination of pregnancy without complication: Secondary | ICD-10-CM | POA: Diagnosis not present

## 2020-08-21 DIAGNOSIS — Z880 Allergy status to penicillin: Secondary | ICD-10-CM | POA: Insufficient documentation

## 2020-08-21 DIAGNOSIS — Z8616 Personal history of COVID-19: Secondary | ICD-10-CM | POA: Insufficient documentation

## 2020-08-21 DIAGNOSIS — Z20822 Contact with and (suspected) exposure to covid-19: Secondary | ICD-10-CM | POA: Insufficient documentation

## 2020-08-21 DIAGNOSIS — Z791 Long term (current) use of non-steroidal anti-inflammatories (NSAID): Secondary | ICD-10-CM | POA: Insufficient documentation

## 2020-08-21 DIAGNOSIS — Z79899 Other long term (current) drug therapy: Secondary | ICD-10-CM | POA: Insufficient documentation

## 2020-08-21 HISTORY — PX: DILATION AND EVACUATION: SHX1459

## 2020-08-21 LAB — CBC
HCT: 43.3 % (ref 36.0–46.0)
Hemoglobin: 13.7 g/dL (ref 12.0–15.0)
MCH: 26.1 pg (ref 26.0–34.0)
MCHC: 31.6 g/dL (ref 30.0–36.0)
MCV: 82.6 fL (ref 80.0–100.0)
Platelets: 236 10*3/uL (ref 150–400)
RBC: 5.24 MIL/uL — ABNORMAL HIGH (ref 3.87–5.11)
RDW: 13.3 % (ref 11.5–15.5)
WBC: 4.6 10*3/uL (ref 4.0–10.5)
nRBC: 0 % (ref 0.0–0.2)

## 2020-08-21 LAB — ABO/RH: ABO/RH(D): O POS

## 2020-08-21 LAB — TYPE AND SCREEN
ABO/RH(D): O POS
Antibody Screen: NEGATIVE

## 2020-08-21 LAB — SARS CORONAVIRUS 2 (TAT 6-24 HRS): SARS Coronavirus 2: NEGATIVE

## 2020-08-21 SURGERY — DILATION AND EVACUATION, UTERUS
Anesthesia: General | Site: Vagina

## 2020-08-21 MED ORDER — CHLOROPROCAINE HCL 1 % IJ SOLN
INTRAMUSCULAR | Status: DC | PRN
  Administered 2020-08-21: 10 mL

## 2020-08-21 MED ORDER — DOXYCYCLINE HYCLATE 100 MG PO TABS
100.0000 mg | ORAL_TABLET | Freq: Once | ORAL | Status: AC
  Administered 2020-08-21: 100 mg via ORAL

## 2020-08-21 MED ORDER — HYDROMORPHONE HCL 1 MG/ML IJ SOLN: 0.2500 mg | INTRAMUSCULAR | Status: DC | PRN

## 2020-08-21 MED ORDER — DOXYCYCLINE HYCLATE 100 MG PO TABS
ORAL_TABLET | ORAL | Status: AC
  Filled 2020-08-21: qty 1

## 2020-08-21 MED ORDER — LACTATED RINGERS IV SOLN: INTRAVENOUS | Status: DC

## 2020-08-21 MED ORDER — KETOROLAC TROMETHAMINE 30 MG/ML IJ SOLN: 30.0000 mg | Freq: Once | INTRAMUSCULAR | Status: DC | PRN

## 2020-08-21 MED ORDER — DIPHENHYDRAMINE HCL 50 MG/ML IJ SOLN
INTRAMUSCULAR | Status: DC | PRN
  Administered 2020-08-21: 12.5 mg via INTRAVENOUS

## 2020-08-21 MED ORDER — FENTANYL CITRATE (PF) 100 MCG/2ML IJ SOLN
INTRAMUSCULAR | Status: AC
  Filled 2020-08-21: qty 2

## 2020-08-21 MED ORDER — ONDANSETRON HCL 4 MG/2ML IJ SOLN
INTRAMUSCULAR | Status: AC
  Filled 2020-08-21: qty 2

## 2020-08-21 MED ORDER — PROMETHAZINE HCL 25 MG/ML IJ SOLN: 6.2500 mg | INTRAMUSCULAR | Status: DC | PRN

## 2020-08-21 MED ORDER — KETOROLAC TROMETHAMINE 30 MG/ML IJ SOLN
INTRAMUSCULAR | Status: DC | PRN
  Administered 2020-08-21: 30 mg via INTRAVENOUS

## 2020-08-21 MED ORDER — PHENYLEPHRINE HCL (PRESSORS) 10 MG/ML IV SOLN
INTRAVENOUS | Status: DC | PRN
  Administered 2020-08-21: 80 ug via INTRAVENOUS
  Administered 2020-08-21: 120 ug via INTRAVENOUS
  Administered 2020-08-21: 80 ug via INTRAVENOUS

## 2020-08-21 MED ORDER — LIDOCAINE 2% (20 MG/ML) 5 ML SYRINGE
INTRAMUSCULAR | Status: DC | PRN
  Administered 2020-08-21: 60 mg via INTRAVENOUS

## 2020-08-21 MED ORDER — KETOROLAC TROMETHAMINE 30 MG/ML IJ SOLN
INTRAMUSCULAR | Status: AC
  Filled 2020-08-21: qty 1

## 2020-08-21 MED ORDER — DEXAMETHASONE SODIUM PHOSPHATE 10 MG/ML IJ SOLN
INTRAMUSCULAR | Status: DC | PRN
  Administered 2020-08-21: 10 mg via INTRAVENOUS

## 2020-08-21 MED ORDER — PROPOFOL 10 MG/ML IV BOLUS
INTRAVENOUS | Status: AC
  Filled 2020-08-21: qty 20

## 2020-08-21 MED ORDER — MIDAZOLAM HCL 2 MG/2ML IJ SOLN
INTRAMUSCULAR | Status: AC
  Filled 2020-08-21: qty 2

## 2020-08-21 MED ORDER — SCOPOLAMINE 1 MG/3DAYS TD PT72
MEDICATED_PATCH | TRANSDERMAL | Status: AC
  Filled 2020-08-21: qty 1

## 2020-08-21 MED ORDER — OXYCODONE HCL 5 MG/5ML PO SOLN
5.0000 mg | Freq: Once | ORAL | Status: DC | PRN
Start: 2020-08-21 — End: 2020-08-21

## 2020-08-21 MED ORDER — ONDANSETRON HCL 4 MG/2ML IJ SOLN
INTRAMUSCULAR | Status: DC | PRN
  Administered 2020-08-21: 4 mg via INTRAVENOUS

## 2020-08-21 MED ORDER — SCOPOLAMINE 1 MG/3DAYS TD PT72
MEDICATED_PATCH | TRANSDERMAL | Status: DC | PRN
  Administered 2020-08-21: 1 via TRANSDERMAL

## 2020-08-21 MED ORDER — DOXYCYCLINE HYCLATE 100 MG PO TABS: 200.0000 mg | ORAL_TABLET | Freq: Once | ORAL | Status: DC

## 2020-08-21 MED ORDER — MIDAZOLAM HCL 2 MG/2ML IJ SOLN
INTRAMUSCULAR | Status: DC | PRN
  Administered 2020-08-21: 2 mg via INTRAVENOUS

## 2020-08-21 MED ORDER — DOXYCYCLINE HYCLATE 100 MG PO TABS
ORAL_TABLET | ORAL | Status: AC
  Filled 2020-08-21: qty 2

## 2020-08-21 MED ORDER — ACETAMINOPHEN 10 MG/ML IV SOLN
INTRAVENOUS | Status: AC
  Filled 2020-08-21: qty 100

## 2020-08-21 MED ORDER — ACETAMINOPHEN 10 MG/ML IV SOLN
INTRAVENOUS | Status: DC | PRN
  Administered 2020-08-21: 1000 mg via INTRAVENOUS

## 2020-08-21 MED ORDER — ACETAMINOPHEN 500 MG PO TABS: 1000.0000 mg | ORAL_TABLET | Freq: Once | ORAL | Status: DC

## 2020-08-21 MED ORDER — POVIDONE-IODINE 10 % EX SWAB: 2.0000 "application " | Freq: Once | CUTANEOUS | Status: DC

## 2020-08-21 MED ORDER — MEPERIDINE HCL 25 MG/ML IJ SOLN: 6.2500 mg | INTRAMUSCULAR | Status: DC | PRN

## 2020-08-21 MED ORDER — SCOPOLAMINE 1 MG/3DAYS TD PT72: 1.0000 | MEDICATED_PATCH | TRANSDERMAL | Status: DC

## 2020-08-21 MED ORDER — FENTANYL CITRATE (PF) 100 MCG/2ML IJ SOLN
INTRAMUSCULAR | Status: DC | PRN
  Administered 2020-08-21 (×2): 50 ug via INTRAVENOUS

## 2020-08-21 MED ORDER — IBUPROFEN 800 MG PO TABS: 800.0000 mg | ORAL_TABLET | Freq: Three times a day (TID) | ORAL | 1 refills | Status: DC | PRN

## 2020-08-21 MED ORDER — OXYCODONE HCL 5 MG PO TABS: 5.0000 mg | ORAL_TABLET | ORAL | 0 refills | Status: DC | PRN

## 2020-08-21 MED ORDER — PHENYLEPHRINE 40 MCG/ML (10ML) SYRINGE FOR IV PUSH (FOR BLOOD PRESSURE SUPPORT)
PREFILLED_SYRINGE | INTRAVENOUS | Status: AC
  Filled 2020-08-21: qty 10

## 2020-08-21 MED ORDER — DEXAMETHASONE SODIUM PHOSPHATE 10 MG/ML IJ SOLN
INTRAMUSCULAR | Status: AC
  Filled 2020-08-21: qty 1

## 2020-08-21 MED ORDER — PROPOFOL 10 MG/ML IV BOLUS
INTRAVENOUS | Status: DC | PRN
  Administered 2020-08-21: 200 mg via INTRAVENOUS

## 2020-08-21 MED ORDER — DIPHENHYDRAMINE HCL 50 MG/ML IJ SOLN
INTRAMUSCULAR | Status: AC
  Filled 2020-08-21: qty 1

## 2020-08-21 MED ORDER — LIDOCAINE 2% (20 MG/ML) 5 ML SYRINGE
INTRAMUSCULAR | Status: AC
  Filled 2020-08-21: qty 5

## 2020-08-21 MED ORDER — OXYCODONE HCL 5 MG PO TABS: 5.0000 mg | ORAL_TABLET | Freq: Once | ORAL | Status: DC | PRN

## 2020-08-21 SURGICAL SUPPLY — 17 items
CATH ROBINSON RED A/P 16FR (CATHETERS) ×2 IMPLANT
COVER WAND RF STERILE (DRAPES) ×2 IMPLANT
DECANTER SPIKE VIAL GLASS SM (MISCELLANEOUS) ×2 IMPLANT
Disposable Rigid Curette Curved 8mm ×1 IMPLANT
FILTER UTR ASPR ASSEMBLY (MISCELLANEOUS) ×2 IMPLANT
GLOVE SURG ENC MOIS LTX SZ6.5 (GLOVE) ×2 IMPLANT
GLOVE SURG UNDER POLY LF SZ6.5 (GLOVE) ×2 IMPLANT
GLOVE SURG UNDER POLY LF SZ7 (GLOVE) ×1 IMPLANT
GOWN STRL REUS W/TWL LRG LVL3 (GOWN DISPOSABLE) ×3 IMPLANT
HOSE CONNECTING 18IN BERKELEY (TUBING) ×2 IMPLANT
KIT BERKELEY 1ST TRI 3/8 NO TR (MISCELLANEOUS) ×2 IMPLANT
KIT BERKELEY 1ST TRIMESTER 3/8 (MISCELLANEOUS) ×4 IMPLANT
KIT TURNOVER CYSTO (KITS) ×2 IMPLANT
NS IRRIG 500ML POUR BTL (IV SOLUTION) ×2 IMPLANT
PACK VAGINAL MINOR WOMEN LF (CUSTOM PROCEDURE TRAY) ×2 IMPLANT
TOWEL OR 17X26 10 PK STRL BLUE (TOWEL DISPOSABLE) ×2 IMPLANT
TRAP TISSUE FILTER (MISCELLANEOUS) ×4 IMPLANT

## 2020-08-21 NOTE — Anesthesia Procedure Notes (Signed)
Procedure Name: LMA Insertion Date/Time: 08/21/2020 9:20 AM Performed by: Norva Pavlov, CRNA Pre-anesthesia Checklist: Patient identified, Emergency Drugs available, Suction available and Patient being monitored Patient Re-evaluated:Patient Re-evaluated prior to induction Oxygen Delivery Method: Circle system utilized Preoxygenation: Pre-oxygenation with 100% oxygen Induction Type: IV induction Ventilation: Mask ventilation without difficulty LMA: LMA inserted LMA Size: 4.0 Number of attempts: 1 Airway Equipment and Method: Bite block Placement Confirmation: positive ETCO2 Tube secured with: Tape Dental Injury: Teeth and Oropharynx as per pre-operative assessment

## 2020-08-21 NOTE — Brief Op Note (Signed)
08/21/2020  9:48 AM  PATIENT:  Wendy Gutierrez  25 y.o. female  PRE-OPERATIVE DIAGNOSIS:  retained products of conception  POST-OPERATIVE DIAGNOSIS:  retained products of conception  PROCEDURE:  Procedure(s): DILATATION AND EVACUATION (N/A)  SURGEON:  Surgeon(s) and Role:    * Bovard-Stuckert, Rogelio Winbush, MD - Primary  ANESTHESIA:   local and general by LMA  EBL:  100cc, IVF and uop per anesthesia   BLOOD ADMINISTERED:none  DRAINS: none   LOCAL MEDICATIONS USED:  LIDOCAINE   SPECIMEN:  Source of Specimen:  Products of Conception  DISPOSITION OF SPECIMEN:  PATHOLOGY  COUNTS:  YES  TOURNIQUET:  * No tourniquets in log *  DICTATION: .Other Dictation: Dictation Number [id number]  PLAN OF CARE: Discharge to home after PACU  PATIENT DISPOSITION:  PACU - hemodynamically stable.   Delay start of Pharmacological VTE agent (>24hrs) due to surgical blood loss or risk of bleeding: not applicable

## 2020-08-21 NOTE — Discharge Instructions (Signed)
DISCHARGE INSTRUCTIONS: D&C / D&E The following instructions have been prepared to help you care for yourself upon your return home.   Personal hygiene: . Use sanitary pads for vaginal drainage, not tampons. . Shower the day after your procedure. . NO tub baths, pools or Jacuzzis for 2-3 weeks. . Wipe front to back after using the bathroom.  Activity and limitations: . Do NOT drive or operate any equipment for 24 hours. The effects of anesthesia are still present and drowsiness may result. . Do NOT rest in bed all day. . Walking is encouraged. . Walk up and down stairs slowly. . You may resume your normal activity in one to two days or as indicated by your physician.  Sexual activity: NO intercourse for at least 2 weeks after the procedure, or as indicated by your physician.  Diet: Eat a light meal as desired this evening. You may resume your usual diet tomorrow.  Return to work: You may resume your work activities in one to two days or as indicated by your doctor.  What to expect after your surgery: Expect to have vaginal bleeding/discharge for 2-3 days and spotting for up to 10 days. It is not unusual to have soreness for up to 1-2 weeks. You may have a slight burning sensation when you urinate for the first day. Mild cramps may continue for a couple of days. You may have a regular period in 2-6 weeks.  Call your doctor for any of the following: . Excessive vaginal bleeding, saturating and changing one pad every hour. . Inability to urinate 6 hours after discharge from hospital. . Pain not relieved by pain medication. . Fever of 100.4 F or greater. . Unusual vaginal discharge or odor.   Call for an appointment:      Post Anesthesia Home Care Instructions  Activity: Get plenty of rest for the remainder of the day. A responsible individual must stay with you for 24 hours following the procedure.  For the next 24 hours, DO NOT: -Drive a car -Operate machinery -Drink  alcoholic beverages -Take any medication unless instructed by your physician -Make any legal decisions or sign important papers.  Meals: Start with liquid foods such as gelatin or soup. Progress to regular foods as tolerated. Avoid greasy, spicy, heavy foods. If nausea and/or vomiting occur, drink only clear liquids until the nausea and/or vomiting subsides. Call your physician if vomiting continues.  Special Instructions/Symptoms: Your throat may feel dry or sore from the anesthesia or the breathing tube placed in your throat during surgery. If this causes discomfort, gargle with warm salt water. The discomfort should disappear within 24 hours.  If you had a scopolamine patch placed behind your ear for the management of post- operative nausea and/or vomiting:  1. The medication in the patch is effective for 72 hours, after which it should be removed.  Wrap patch in a tissue and discard in the trash. Wash hands thoroughly with soap and water. 2. You may remove the patch earlier than 72 hours if you experience unpleasant side effects which may include dry mouth, dizziness or visual disturbances. 3. Avoid touching the patch. Wash your hands with soap and water after contact with the patch.     

## 2020-08-21 NOTE — Transfer of Care (Signed)
Immediate Anesthesia Transfer of Care Note  Patient: Cristianna Cyr  Procedure(s) Performed: Procedure(s) (LRB): DILATATION AND EVACUATION (N/A)  Patient Location: PACU  Anesthesia Type: General  Level of Consciousness: awake, alert  and oriented  Airway & Oxygen Therapy: Patient Spontanous Breathing and Patient connected to nasal cannula oxygen  Post-op Assessment: Report given to PACU RN and Post -op Vital signs reviewed and stable  Post vital signs: Reviewed and stable  Complications: No apparent anesthesia complications  Last Vitals:  Vitals Value Taken Time  BP 109/67 08/21/20 0948  Temp    Pulse 80 08/21/20 0951  Resp 20 08/21/20 0951  SpO2 100 % 08/21/20 0951  Vitals shown include unvalidated device data.  Last Pain:  Vitals:   08/21/20 0755  TempSrc: Oral         Complications: No complications documented.

## 2020-08-21 NOTE — Anesthesia Postprocedure Evaluation (Signed)
Anesthesia Post Note  Patient: Wendy Gutierrez  Procedure(s) Performed: DILATATION AND EVACUATION (N/A Vagina )     Patient location during evaluation: PACU Anesthesia Type: General Level of consciousness: awake and alert, oriented and patient cooperative Pain management: pain level controlled Vital Signs Assessment: post-procedure vital signs reviewed and stable Respiratory status: spontaneous breathing, nonlabored ventilation and respiratory function stable Cardiovascular status: blood pressure returned to baseline and stable Postop Assessment: no apparent nausea or vomiting Anesthetic complications: no   No complications documented.  Last Vitals:  Vitals:   08/21/20 0755 08/21/20 0947  BP: 115/71 109/67  Pulse: 76 81  Resp: 15 (!) 23  Temp: 37.1 C 36.8 C  SpO2:  100%    Last Pain:  Vitals:   08/21/20 0947  TempSrc:   PainSc: 0-No pain                 Lannie Fields

## 2020-08-21 NOTE — Interval H&P Note (Signed)
History and Physical Interval Note:  08/21/2020 8:01 AM  Wendy Gutierrez  has presented today for surgery, with the diagnosis of retained products of conception.  The various methods of treatment have been discussed with the patient and family. After consideration of risks, benefits and other options for treatment, the patient has consented to  Procedure(s): DILATATION AND EVACUATION (N/A) as a surgical intervention.  The patient's history has been reviewed, patient examined, no change in status, stable for surgery.  I have reviewed the patient's chart and labs.  Questions were answered to the patient's satisfaction.     [name]

## 2020-08-21 NOTE — Anesthesia Preprocedure Evaluation (Signed)
Anesthesia Evaluation  Patient identified by MRN, date of birth, ID band Patient awake    Reviewed: Allergy & Precautions, NPO status , Patient's Chart, lab work & pertinent test results  Airway Mallampati: III  TM Distance: >3 FB Neck ROM: Full    Dental no notable dental hx. (+) Teeth Intact, Dental Advisory Given   Pulmonary neg pulmonary ROS,    Pulmonary exam normal breath sounds clear to auscultation       Cardiovascular negative cardio ROS Normal cardiovascular exam Rhythm:Regular Rate:Normal     Neuro/Psych negative neurological ROS  negative psych ROS   GI/Hepatic negative GI ROS, Neg liver ROS,   Endo/Other  Obesity BMI 34  Renal/GU negative Renal ROS  negative genitourinary   Musculoskeletal negative musculoskeletal ROS (+)   Abdominal (+) + obese,   Peds negative pediatric ROS (+)  Hematology negative hematology ROS (+)   Anesthesia Other Findings   Reproductive/Obstetrics Retained POC, about 7.5wks                             Anesthesia Physical Anesthesia Plan  ASA: II  Anesthesia Plan: General   Post-op Pain Management:    Induction: Intravenous  PONV Risk Score and Plan: 4 or greater and Ondansetron, Dexamethasone, Midazolam and Treatment may vary due to age or medical condition  Airway Management Planned: LMA  Additional Equipment: None  Intra-op Plan:   Post-operative Plan: Extubation in OR  Informed Consent: I have reviewed the patients History and Physical, chart, labs and discussed the procedure including the risks, benefits and alternatives for the proposed anesthesia with the patient or authorized representative who has indicated his/her understanding and acceptance.     Dental advisory given  Plan Discussed with: CRNA  Anesthesia Plan Comments:         Anesthesia Quick Evaluation

## 2020-08-22 NOTE — Op Note (Signed)
NAMEMERRILLYN, Gutierrez MEDICAL RECORD NO: [id number] ACCOUNT NO: [id number] DATE OF BIRTH: June 05, 1995 FACILITY: [facility] LOCATION: [facility] PHYSICIAN: Sherian Rein, MD  Operative Report   DATE OF PROCEDURE: 08/21/2020  PREOPERATIVE DIAGNOSIS:  Retained products of conception after elective termination of pregnancy.  POSTOPERATIVE DIAGNOSIS:  Retained products of conception after elective termination of pregnancy.  PROCEDURE PERFORMED:  Suction D and C.  SURGEON:  Sherian Rein, MD,  ANESTHESIA:  Local and general by LMA.  ESTIMATED BLOOD LOSS:  100 mL  IV FLUID AND URINE OUTPUT:  Per Anesthesia.  COMPLICATIONS:  None.  PATHOLOGY:  Products of conception.  DESCRIPTION OF PROCEDURE:  After informed consent was reviewed with the patient including risks, benefits and alternatives of the surgical procedure she was transported to the operating room and placed on the table in supine position.  General anesthesia  was induced and found to be adequate.  She was then placed in the [place] stirrups, prepped and draped in the normal sterile fashion.  Her bladder was sterilely drained.  Using an open-sided speculum, her cervix was easily visualized.  A 10 mL  paracervical block was placed.  The cervix was dilated to accommodate an 8-French curette.  Several passes were made with a small amount of tissue consistent with what was present on ultrasound.  A brief curetting revealed a gritty texture in all 4  quadrants.  The patient tolerated the procedure well.  Sponge, lap and needle counts were correct x2 per the operating staff.  She was awakened in stable condition and transferred to the PACU.   Xaver.Mink D: 08/21/2020 9:52:18 am T: 08/22/2020 2:38:00 am  JOB: [id number]/ [id number]

## 2020-08-24 ENCOUNTER — Encounter: Payer: Self-pay | Admitting: Advanced Practice Midwife

## 2020-08-24 ENCOUNTER — Encounter (HOSPITAL_BASED_OUTPATIENT_CLINIC_OR_DEPARTMENT_OTHER): Payer: Self-pay | Admitting: Obstetrics and Gynecology

## 2020-08-24 LAB — SURGICAL PATHOLOGY

## 2020-12-11 DIAGNOSIS — M79642 Pain in left hand: Secondary | ICD-10-CM | POA: Diagnosis not present

## 2020-12-11 DIAGNOSIS — M25572 Pain in left ankle and joints of left foot: Secondary | ICD-10-CM | POA: Diagnosis not present

## 2020-12-11 DIAGNOSIS — M25562 Pain in left knee: Secondary | ICD-10-CM | POA: Diagnosis not present

## 2020-12-11 DIAGNOSIS — M542 Cervicalgia: Secondary | ICD-10-CM | POA: Diagnosis not present

## 2021-01-01 DIAGNOSIS — K0889 Other specified disorders of teeth and supporting structures: Secondary | ICD-10-CM | POA: Diagnosis not present

## 2021-01-01 DIAGNOSIS — T7840XA Allergy, unspecified, initial encounter: Secondary | ICD-10-CM | POA: Diagnosis not present

## 2021-01-11 ENCOUNTER — Other Ambulatory Visit: Payer: Self-pay

## 2021-01-11 ENCOUNTER — Ambulatory Visit (INDEPENDENT_AMBULATORY_CARE_PROVIDER_SITE_OTHER): Payer: BC Managed Care – PPO

## 2021-01-11 DIAGNOSIS — Z3200 Encounter for pregnancy test, result unknown: Secondary | ICD-10-CM

## 2021-01-11 DIAGNOSIS — Z3201 Encounter for pregnancy test, result positive: Secondary | ICD-10-CM

## 2021-01-11 LAB — POCT URINE PREGNANCY: Preg Test, Ur: POSITIVE — AB

## 2021-01-11 MED ORDER — VITAFOL FE+ 90-1-200 & 50 MG PO CPPK: 1.0000 | ORAL_CAPSULE | Freq: Every day | ORAL | 12 refills | Status: DC

## 2021-01-11 NOTE — Progress Notes (Signed)
..  Ms. Tiznado presents today for UPT. She has no unusual complaints. LMP: 11-15-20    OBJECTIVE: Appears well, in no apparent distress.  OB History     Gravida  4   Para  2   Term  2   Preterm  0   AB  1   Living  2      SAB  0   IAB  1   Ectopic  0   Multiple      Live Births  2          Home UPT Result: Positive In-Office UPT result: Positive I have reviewed the patient's medical, obstetrical, social, and family histories, and medications.   ASSESSMENT: Positive pregnancy test  PLAN Prenatal care to be completed at: [facility]

## 2021-01-11 NOTE — Progress Notes (Signed)
Patient was assessed and managed by nursing staff during this encounter. I have reviewed the chart and agree with the documentation and plan. I have also made any necessary editorial changes.  Catalina Antigua, MD 01/11/2021 1:35 PM

## 2021-01-18 ENCOUNTER — Other Ambulatory Visit: Payer: Self-pay | Admitting: *Deleted

## 2021-01-18 ENCOUNTER — Encounter: Payer: Self-pay | Admitting: *Deleted

## 2021-01-18 DIAGNOSIS — Z3480 Encounter for supervision of other normal pregnancy, unspecified trimester: Secondary | ICD-10-CM

## 2021-01-18 MED ORDER — PROMETHAZINE HCL 25 MG PO TABS: 25.0000 mg | ORAL_TABLET | Freq: Four times a day (QID) | ORAL | 1 refills | Status: DC | PRN

## 2021-01-19 ENCOUNTER — Ambulatory Visit (INDEPENDENT_AMBULATORY_CARE_PROVIDER_SITE_OTHER): Payer: BC Managed Care – PPO

## 2021-01-19 ENCOUNTER — Other Ambulatory Visit: Payer: Self-pay

## 2021-01-19 VITALS — BP 110/69 | HR 75 | Ht 67.0 in | Wt 213.3 lb

## 2021-01-19 DIAGNOSIS — Z3491 Encounter for supervision of normal pregnancy, unspecified, first trimester: Secondary | ICD-10-CM | POA: Insufficient documentation

## 2021-01-19 DIAGNOSIS — Z3480 Encounter for supervision of other normal pregnancy, unspecified trimester: Secondary | ICD-10-CM | POA: Insufficient documentation

## 2021-01-19 DIAGNOSIS — Z3481 Encounter for supervision of other normal pregnancy, first trimester: Secondary | ICD-10-CM | POA: Diagnosis not present

## 2021-01-19 DIAGNOSIS — Z3A01 Less than 8 weeks gestation of pregnancy: Secondary | ICD-10-CM

## 2021-01-19 DIAGNOSIS — O3680X0 Pregnancy with inconclusive fetal viability, not applicable or unspecified: Secondary | ICD-10-CM

## 2021-01-19 NOTE — Progress Notes (Signed)
New OB Intake  I connected with  Wendy Gutierrez on 01/19/21 at 10:15 AM EDT by in person and verified that I am speaking with the correct person using two identifiers. Nurse is located at [facility] and pt is located at [place].  I discussed the limitations, risks, security and privacy concerns of performing an evaluation and management service by telephone and the availability of in person appointments. I also discussed with the patient that there may be a patient responsible charge related to this service. The patient expressed understanding and agreed to proceed.  I explained I am completing New OB Intake today. We discussed her EDD of 09/13/21 that is based on early u/s. Pt is G4/P2012. I reviewed her allergies, medications, Medical/Surgical/OB history, and appropriate screenings. I informed her of [facility] services. Based on history, this is a/an  pregnancy uncomplicated .   Patient Active Problem List   Diagnosis Date Noted   ASCUS of cervix with negative high risk HPV 06/08/2017   Migraine without aura and without status migrainosus, not intractable 07/24/2014    Concerns addressed today  Delivery Plans:  Plans to deliver at [facility].   MyChart/Babyscripts MyChart access verified. I explained pt will have some visits in office and some virtually. Babyscripts instructions given and order placed. Patient verifies receipt of registration text/e-mail. Account successfully created and app downloaded.  Blood Pressure Cuff  Patient has private insurance; instructed to purchase blood pressure cuff and bring to first prenatal appt. Explained after first prenatal appt pt will check weekly and document in Babyscripts.  Weight scale: Patient does have weight scale at home.  Anatomy US Explained first scheduled [place] will be around 19 weeks. Dating and viability scan performed today  Labs Discussed Avelina Laine genetic screening with patient. Would like both Panorama and Horizon drawn at new OB visit.  Routine prenatal labs needed.  Covid Vaccine Patient has covid vaccine.   Mother/ Baby Dyad Candidate?    If yes, offer as possibility  Informed patient of [facility] website  and placed link in her AVS.   Social Determinants of Health Food Insecurity: Patient denies food insecurity. WIC Referral: Patient is interested in referral to [facility].  Transportation: Patient denies transportation needs. Childcare: Discussed no children allowed at ultrasound appointments. Offered childcare services; patient declines childcare services at this time.  Send link to Pregnancy Navigators   Placed OB Box on problem list and updated  First visit review I reviewed new OB appt with pt. I explained she will have a pelvic exam, ob bloodwork with genetic screening, and PAP smear. Explained pt will be seen by Gerrit Heck at first visit; encounter routed to appropriate provider. Explained that patient will be seen by pregnancy navigator following visit with provider. [facility] information placed in AVS.   Hamilton Capri, RN 01/19/2021  10:22 AM

## 2021-01-25 ENCOUNTER — Other Ambulatory Visit: Payer: Self-pay

## 2021-01-25 MED ORDER — BLOOD PRESSURE KIT DEVI: 0 refills | Status: DC

## 2021-01-25 NOTE — Telephone Encounter (Signed)
Blood  Pressure cuff sent to [facility].

## 2021-01-26 ENCOUNTER — Telehealth (INDEPENDENT_AMBULATORY_CARE_PROVIDER_SITE_OTHER): Payer: BC Managed Care – PPO | Admitting: Obstetrics & Gynecology

## 2021-01-26 DIAGNOSIS — Z3480 Encounter for supervision of other normal pregnancy, unspecified trimester: Secondary | ICD-10-CM

## 2021-01-26 DIAGNOSIS — Z3481 Encounter for supervision of other normal pregnancy, first trimester: Secondary | ICD-10-CM

## 2021-01-26 DIAGNOSIS — Z3A00 Weeks of gestation of pregnancy not specified: Secondary | ICD-10-CM

## 2021-01-26 MED ORDER — VITAMIN B-6 25 MG PO TABS: 50.0000 mg | ORAL_TABLET | Freq: Two times a day (BID) | ORAL | 1 refills | Status: DC

## 2021-01-26 NOTE — Progress Notes (Signed)
   TELEHEALTH GYNECOLOGY VISIT ENCOUNTER NOTE  Provider location: Center for [facility]   Patient location: Home  I connected with Wendy Gutierrez on 01/26/21 at 10:35 AM EDT by telephone and verified that I am speaking with the correct person using two identifiers. Patient was unable to do MyChart audiovisual encounter due to technical difficulties, she tried several times.    I discussed the limitations, risks, security and privacy concerns of performing an evaluation and management service by telephone and the availability of in person appointments. I also discussed with the patient that there may be a patient responsible charge related to this service. The patient expressed understanding and agreed to proceed.   History:  Wendy Gutierrez is a 25 y.o. [phone] female being evaluated today for first trimester nausea and dry skin on fingers of both hands. She denies any abnormal vaginal discharge, bleeding, pelvic pain or other concerns.       Past Medical History:  Diagnosis Date   COVID 11/2019   fever x 2 days all symptoms resolved   Headache    Vitamin D deficiency    Past Surgical History:  Procedure Laterality Date   DILATION AND EVACUATION N/A 08/21/2020   Procedure: DILATATION AND EVACUATION;  Surgeon: Sherian Rein, MD;  Location: [facility];  Service: Gynecology;  Laterality: N/A;   NO PAST SURGERIES     The following portions of the patient's history were reviewed and updated as appropriate: allergies, current medications, past family history, past medical history, past social history, past surgical history and problem list.   Health Maintenance:  Normal pap on 04/2019.  Marland Kitchen   Review of Systems:  Pertinent items noted in HPI and remainder of comprehensive ROS otherwise negative.  Physical Exam:   General:  Alert, oriented and cooperative.   Mental Status: Normal mood and affect perceived. Normal judgment and thought content.   Physical exam deferred due to nature of the encounter  Labs and Imaging No results found for this or any previous visit (from the past 336 hour(s)). No results found.    Assessment and Plan:     1. Encounter for supervision of other normal pregnancy in first trimester Nausea and vomiting - vitamin B-6 (PYRIDOXINE) 25 MG tablet; Take 2 tablets (50 mg total) by mouth 2 (two) times daily.  Dispense: 60 tablet; Refill: 1 Dry skin for 1 week, moisturize and use HC 1% cream OTC      I discussed the assessment and treatment plan with the patient. The patient was provided an opportunity to ask questions and all were answered. The patient agreed with the plan and demonstrated an understanding of the instructions.   The patient was advised to call back or seek an in-person evaluation/go to the ED if the symptoms worsen or if the condition fails to improve as anticipated.  I provided 12 minutes of non-face-to-face time during this encounter.   Scheryl Darter, MD Center for [facility], [facility]

## 2021-01-26 NOTE — Progress Notes (Signed)
I connected with  Wendy Gutierrez on 01/26/21 by a video enabled telemedicine application and verified that I am speaking with the correct person using two identifiers.   I discussed the limitations of evaluation and management by telemedicine. The patient expressed understanding and agreed to proceed.   Mychart problem visit c/o hands striping x 7 days. Denies pain, swelling, itching, burning.

## 2021-02-24 ENCOUNTER — Encounter: Payer: BC Managed Care – PPO | Admitting: Obstetrics and Gynecology

## 2021-02-25 ENCOUNTER — Other Ambulatory Visit (HOSPITAL_COMMUNITY)
Admission: RE | Admit: 2021-02-25 | Discharge: 2021-02-25 | Disposition: A | Discharge status: Home / Self Care | Payer: BC Managed Care – PPO | Source: Ambulatory Visit | Attending: Obstetrics and Gynecology | Admitting: Obstetrics and Gynecology

## 2021-02-25 ENCOUNTER — Ambulatory Visit (INDEPENDENT_AMBULATORY_CARE_PROVIDER_SITE_OTHER): Payer: BC Managed Care – PPO

## 2021-02-25 ENCOUNTER — Other Ambulatory Visit: Payer: Self-pay

## 2021-02-25 VITALS — Wt 217.0 lb

## 2021-02-25 DIAGNOSIS — O99210 Obesity complicating pregnancy, unspecified trimester: Secondary | ICD-10-CM

## 2021-02-25 DIAGNOSIS — O368390 Maternal care for abnormalities of the fetal heart rate or rhythm, unspecified trimester, not applicable or unspecified: Secondary | ICD-10-CM

## 2021-02-25 DIAGNOSIS — Z3400 Encounter for supervision of normal first pregnancy, unspecified trimester: Secondary | ICD-10-CM | POA: Diagnosis not present

## 2021-02-25 DIAGNOSIS — Z3481 Encounter for supervision of other normal pregnancy, first trimester: Secondary | ICD-10-CM | POA: Diagnosis not present

## 2021-02-25 DIAGNOSIS — Z3A11 11 weeks gestation of pregnancy: Secondary | ICD-10-CM | POA: Diagnosis not present

## 2021-02-25 DIAGNOSIS — O368311 Maternal care for abnormalities of the fetal heart rate or rhythm, first trimester, fetus 1: Secondary | ICD-10-CM | POA: Diagnosis not present

## 2021-02-25 DIAGNOSIS — Z810 Family history of intellectual disabilities: Secondary | ICD-10-CM | POA: Diagnosis not present

## 2021-02-25 MED ORDER — BLOOD PRESSURE MONITOR DEVI: 0 refills | Status: DC

## 2021-02-25 MED ORDER — ASPIRIN 81 MG PO CHEW: 81.0000 mg | CHEWABLE_TABLET | Freq: Every day | ORAL | 2 refills | Status: DC

## 2021-02-25 NOTE — Progress Notes (Signed)
Pap 2021- normal

## 2021-02-25 NOTE — Progress Notes (Signed)
Subjective:   Wendy Gutierrez is a 25 y.o. [id number] at 29w3dby 6 week early ultrasound being seen today for her first obstetrical visit.  Patient states this was an unplanned pregnancy.  Patient reports she was not on birth control prior to conception.   Patient is present today with husband-Dwayne.  Gynecological/Obstetrical History: Patient reports no history of gynecological surgeries.  Patient denies history of abnormal pap smears.   Pregnancy history fully reviewed. Patient does not intend to breast feed. Patient obstetrical history is significant for obesity.   Sexual Activity and Vaginal Concerns: Patient is currently sexually active and denies pain or discomfort during intercourse.  She also denies vaginal discharge, bleeding, irritation, or odor. Patient also denies pain or difficulty with urination.    Medical History/ROS: Patient denies medical history significant for cardiovascular, respiratory, gastrointestinal, or hematological disorders. Patient also denies history of MH disorders including anxiety and/or depression.  Patient reports nausea and fatigue.  She states she has been seeing improvement in the past few days. Patient denies diarrhea, but reports constipation in which she has been having a hard time passing stool.  No recurrent headaches.    Social History: Patient denies history or current usage of tobacco, alcohol, or drugs.  Patient reports the FOB is Dwayne who is present, and supportive.  Patient reports that she lives with husband and two children.   She endorses safety at home.  Patient denies DV/A. Patient is currently employed at [employer]  HISTORY: OB History  Gravida Para Term Preterm AB Living  _0 0 1 2  SAB IAB Ectopic Multiple Live Births  0 1 0 0 2    # Outcome Date GA Lbr Len/2nd Weight Sex Delivery Anes PTL Lv  4 Current           3 IAB 08/2020          2 Term 05/02/17 397w1d5:21 / 00:06 7 lb 6.2 oz (3.351 kg) M  Vag-Spont EPI  LIV     Name: SMALLS,BOY Jisele     Apgar1: 9  Apgar5: 9  1 Term 12/05/11 3828w5d:00 / 00:28 6 lb 15.6 oz (3.165 kg) F Vag-Spont EPI  LIV     Name: SMAElizebeth KollerISLEY     Apgar1: 8  Loris    Last pap smear was done Jan 2021 and was normal  Past Medical History:  Diagnosis Date   COVID 11/2019   fever x 2 days all symptoms resolved   Headache    Vitamin D deficiency    Past Surgical History:  Procedure Laterality Date   DILATION AND EVACUATION N/A 08/21/2020   Procedure: DILATATION AND EVACUATION;  Surgeon: BovJanyth ContesD;  [facility]  Laterality: N/A;   NO PAST SURGERIES     Family History  Problem Relation Age of Onset   Healthy Mother    Breast cancer Maternal Grandmother    Diabetes Maternal Uncle    Social History   Tobacco Use   Smoking status: Never   Smokeless tobacco: Never  Vaping Use   Vaping Use: Never used  Substance Use Topics   Alcohol use: Not Currently   Drug use: Not Currently   Allergies  Allergen Reactions   Amoxicillin Rash   Current Outpatient Medications on File Prior to Visit  Medication Sig Dispense Refill   acetaminophen (TYLENOL) 500 MG tablet Take 500 mg by mouth every 6 (six) hours as needed.  Blood Pressure Monitoring (BLOOD PRESSURE KIT) DEVI Please check blood pressure 1-2 per week or if you are not feeling well. 1 each 0   Prenat-FePoly-Metf-FA-DHA-DSS (VITAFOL FE+) 90-1-200 & 50 MG CPPK Take 1 tablet by mouth daily. 30 each 12   promethazine (PHENERGAN) 25 MG tablet Take 1 tablet (25 mg total) by mouth every 6 (six) hours as needed for nausea or vomiting. 30 tablet 1   vitamin B-6 (PYRIDOXINE) 25 MG tablet Take 2 tablets (50 mg total) by mouth 2 (two) times daily. 60 tablet 1   No current facility-administered medications on file prior to visit.    Review of Systems Pertinent items noted in HPI and remainder of comprehensive ROS otherwise  negative.  Exam   Vitals:   02/25/21 1023  Weight: 217 lb (98.4 kg)      Physical Exam Constitutional:      Appearance: Normal appearance.  Eyes:     Conjunctiva/sclera: Conjunctivae normal.  Cardiovascular:     Rate and Rhythm: Regular rhythm.     Heart sounds: Normal heart sounds.  Pulmonary:     Effort: Pulmonary effort is normal.     Breath sounds: Normal breath sounds.  Abdominal:     General: Bowel sounds are normal.     Palpations: Abdomen is soft.     Tenderness: There is no abdominal tenderness.  Musculoskeletal:        General: Normal range of motion.     Cervical back: Normal range of motion.     Right lower leg: No edema.     Left lower leg: No edema.  Neurological:     Mental Status: She is alert and oriented to person, place, and time.  Skin:    General: Skin is warm and dry.  Psychiatric:        Mood and Affect: Mood normal.        Behavior: Behavior normal.        Thought Content: Thought content normal.  Vitals reviewed.    Assessment:   25 y.o. year old G4P2012 Patient Active Problem List   Diagnosis Date Noted   Encounter for supervision of normal pregnancy in first trimester 01/19/2021   ASCUS of cervix with negative high risk HPV 06/08/2017   Migraine without aura and without status migrainosus, not intractable 07/24/2014     Plan:  1. Supervision of normal first pregnancy, antepartum  -Congratulations given and patient welcomed back to practice. -Discussed usage of Babyscripts and virtual visits as additional source of managing and completing PN visits.   *Instructed to take blood pressure and record weekly into babyscripts. *Reviewed prenatal visit schedule and platforms used for virtual visits.  -Anticipatory guidance for prenatal visits including labs, ultrasounds, and testing; Initial labs completed. -Genetic Screening discussed, First trimester screen, Quad screen, and NIPS: ordered. -Encouraged to complete and utilize MyChart  Registration for her ability to review results, send requests, and have questions addressed.  -Discussed estimated due date of September 13, 2021. -Ultrasound discussed; fetal anatomic survey: ordered. -Continue prenatal vitamins  -Influenza offered and declined. -Encouraged to seek out care at office or emergency room for urgent and/or emergent concerns. -Educated on the nature of [place] with multiple MDs and other Advanced Practice Providers was explained to patient; also emphasized that residents, students are part of our team. Informed of her right to refuse care as she deems appropriate.  -No questions or concerns.   2. [redacted] weeks gestation of pregnancy -Plan  to RTO in 4-5 weeks for ROB and AFP.  2. Ultrasound scan done for inability to hear fetal heart tones -BSUS completed with no movement visualized and questionable fetal heart rate. -Sent for formal US to assess heart tones: 155  4. Obesity in pregnancy -BMI 33.99 today -Start bASA after 12 weeks. Message sent via mychart -Will perform HgB A1C and BL labs.    No follow-ups on file.     Maryann Conners, CNM 02/25/2021 10:48 AM

## 2021-02-26 LAB — OBSTETRIC PANEL, INCLUDING HIV
Antibody Screen: NEGATIVE
Basophils Absolute: 0 10*3/uL (ref 0.0–0.2)
Basos: 0 %
EOS (ABSOLUTE): 0.1 10*3/uL (ref 0.0–0.4)
Eos: 2 %
HIV Screen 4th Generation wRfx: NONREACTIVE
Hematocrit: 37.3 % (ref 34.0–46.6)
Hemoglobin: 12.2 g/dL (ref 11.1–15.9)
Hepatitis B Surface Ag: NEGATIVE
Immature Grans (Abs): 0 10*3/uL (ref 0.0–0.1)
Immature Granulocytes: 0 %
Lymphocytes Absolute: 1.5 10*3/uL (ref 0.7–3.1)
Lymphs: 21 %
MCH: 25.9 pg — ABNORMAL LOW (ref 26.6–33.0)
MCHC: 32.7 g/dL (ref 31.5–35.7)
MCV: 79 fL (ref 79–97)
Monocytes Absolute: 0.6 10*3/uL (ref 0.1–0.9)
Monocytes: 8 %
Neutrophils Absolute: 5 10*3/uL (ref 1.4–7.0)
Neutrophils: 69 %
Platelets: 231 10*3/uL (ref 150–450)
RBC: 4.71 x10E6/uL (ref 3.77–5.28)
RDW: 13.1 % (ref 11.7–15.4)
RPR Ser Ql: NONREACTIVE
Rh Factor: POSITIVE
Rubella Antibodies, IGG: 1.72 index (ref >0.99)
WBC: 7.2 10*3/uL (ref 3.4–10.8)

## 2021-02-26 LAB — GC/CHLAMYDIA PROBE AMP (~~LOC~~) NOT AT ARMC
Chlamydia: NEGATIVE
Comment: NEGATIVE
Comment: NORMAL
Neisseria Gonorrhea: NEGATIVE

## 2021-02-26 LAB — HEMOGLOBIN A1C
Est. average glucose Bld gHb Est-mCnc: 111 mg/dL
Hgb A1c MFr Bld: 5.5 % (ref 4.8–5.6)

## 2021-02-27 LAB — CULTURE, OB URINE

## 2021-02-27 LAB — URINE CULTURE, OB REFLEX

## 2021-02-28 LAB — COMPREHENSIVE METABOLIC PANEL
ALT: 29 IU/L (ref 0–32)
AST: 18 IU/L (ref 0–40)
Albumin/Globulin Ratio: 1.5 (ref 1.2–2.2)
Albumin: 4.1 g/dL (ref 3.9–5.0)
Alkaline Phosphatase: 64 IU/L (ref 44–121)
BUN/Creatinine Ratio: 11 (ref 9–23)
BUN: 8 mg/dL (ref 6–20)
Bilirubin Total: <0.2 mg/dL (ref 0.0–1.2)
CO2: 17 mmol/L — ABNORMAL LOW (ref 20–29)
Calcium: 9 mg/dL (ref 8.7–10.2)
Chloride: 101 mmol/L (ref 96–106)
Creatinine, Ser: 0.72 mg/dL (ref 0.57–1.00)
Globulin, Total: 2.7 g/dL (ref 1.5–4.5)
Glucose: 119 mg/dL — ABNORMAL HIGH (ref 70–99)
Potassium: 3.9 mmol/L (ref 3.5–5.2)
Sodium: 137 mmol/L (ref 134–144)
Total Protein: 6.8 g/dL (ref 6.0–8.5)
eGFR: 117 mL/min/{1.73_m2} (ref >59)

## 2021-02-28 LAB — SPECIMEN STATUS REPORT

## 2021-03-08 ENCOUNTER — Encounter: Payer: Self-pay | Admitting: Obstetrics and Gynecology

## 2021-03-15 ENCOUNTER — Encounter: Payer: Self-pay | Admitting: Obstetrics and Gynecology

## 2021-03-22 DIAGNOSIS — U071 COVID-19: Secondary | ICD-10-CM | POA: Diagnosis not present

## 2021-03-22 DIAGNOSIS — B349 Viral infection, unspecified: Secondary | ICD-10-CM | POA: Diagnosis not present

## 2021-03-24 ENCOUNTER — Other Ambulatory Visit: Payer: Self-pay

## 2021-03-24 DIAGNOSIS — D563 Thalassemia minor: Secondary | ICD-10-CM

## 2021-03-24 DIAGNOSIS — Z3481 Encounter for supervision of other normal pregnancy, first trimester: Secondary | ICD-10-CM

## 2021-03-25 ENCOUNTER — Telehealth (INDEPENDENT_AMBULATORY_CARE_PROVIDER_SITE_OTHER): Payer: BC Managed Care – PPO | Admitting: Obstetrics and Gynecology

## 2021-03-25 ENCOUNTER — Encounter: Payer: Self-pay | Admitting: Obstetrics and Gynecology

## 2021-03-25 DIAGNOSIS — O99212 Obesity complicating pregnancy, second trimester: Secondary | ICD-10-CM

## 2021-03-25 DIAGNOSIS — Z3A15 15 weeks gestation of pregnancy: Secondary | ICD-10-CM

## 2021-03-25 DIAGNOSIS — D563 Thalassemia minor: Secondary | ICD-10-CM

## 2021-03-25 DIAGNOSIS — Z3481 Encounter for supervision of other normal pregnancy, first trimester: Secondary | ICD-10-CM

## 2021-03-25 DIAGNOSIS — O99210 Obesity complicating pregnancy, unspecified trimester: Secondary | ICD-10-CM

## 2021-03-25 MED ORDER — ASPIRIN 81 MG PO CHEW: 81.0000 mg | CHEWABLE_TABLET | Freq: Every day | ORAL | 2 refills | Status: DC

## 2021-03-25 NOTE — Progress Notes (Signed)
Has not started ASA because it was not filled by pharmacy. RX reordered.

## 2021-03-25 NOTE — Progress Notes (Signed)
OBSTETRICS PRENATAL VIRTUAL VISIT ENCOUNTER NOTE  Provider location: [facility]   Patient location: Home  I connected with Wendy Gutierrez on 03/25/21 at  9:35 AM EST by MyChart Video Encounter and verified that I am speaking with the correct person using two identifiers. I discussed the limitations, risks, security and privacy concerns of performing an evaluation and management service virtually and the availability of in person appointments. I also discussed with the patient that there may be a patient responsible charge related to this service. The patient expressed understanding and agreed to proceed. Subjective:  Wendy Gutierrez is a 25 y.o. [phone] at [redacted]w[redacted]d being seen today for ongoing prenatal care.  She is currently monitored for the following issues for this low-risk pregnancy and has Migraine without aura and without status migrainosus, not intractable; ASCUS of cervix with negative high risk HPV; Encounter for supervision of normal pregnancy in first trimester; Obesity in pregnancy; and Alpha thalassemia silent carrier on their problem list.  Patient reports no complaints.   .  .   . Denies any leaking of fluid.   The following portions of the patient's history were reviewed and updated as appropriate: allergies, current medications, past family history, past medical history, past social history, past surgical history and problem list.   Objective:  There were no vitals filed for this visit.  Fetal Status:           General:  Alert, oriented and cooperative. Patient is in no acute distress.  Respiratory: Normal respiratory effort, no problems with respiration noted  Mental Status: Normal mood and affect. Normal behavior. Normal judgment and thought content.  Rest of physical exam deferred due to type of encounter  Imaging: US OB Limited  Result Date: 03/20/2021 ----------------------------------------------------------------------  OBSTETRICS  REPORT                       (Signed Final 03/20/2021 12:06 pm) ---------------------------------------------------------------------- Patient Info  ID #:       [id number]                          D.O.B.:  11/06/1995 (25 yrs)  Name:       Wendy Gutierrez                 Visit Date: 02/25/2021 04:27 pm ---------------------------------------------------------------------- Performed By  Attending:        Nettie Elm MD       Ref. Address:     Faculty Practice  Performed By:     Angelica Pou RN         Location:         Center for                                                             [facility]                                                             Healthcare  [place]  Referred By:      Gerrit Heck                    CNM ---------------------------------------------------------------------- Orders  #  Description                           Code        Ordered By  1  US OB LIMITED                         [id number]     JESSICA EMLY ----------------------------------------------------------------------  #  Order #                     Accession #                Episode #  1  [id number]                   [id number]                 [id number] ---------------------------------------------------------------------- Indications  [redacted] weeks gestation of pregnancy                Z3A.11  Unable to hear fetal heart tones as reason     O76  for ultrasound ---------------------------------------------------------------------- Fetal Evaluation  Num Of Fetuses:         1  Fetal Heart Rate(bpm):  155  Cardiac Activity:       Observed ---------------------------------------------------------------------- Gestational Age  Best:          11w 3d     Det. By:  U/S C R L  (01/19/21)    EDD:   09/13/21 ---------------------------------------------------------------------- Comments  Unable to hear FHT with handheld doppler. FHR 155  ---------------------------------------------------------------------- Impression  Single viable pregnancy  11w 3d by CRL  EDD of 09/13/21 by today's U/S ---------------------------------------------------------------------- Recommendations  F/U OB U/S as clinically indicated ----------------------------------------------------------------------                  Nettie Elm, MD Electronically Signed Final Report   03/20/2021 12:06 pm ----------------------------------------------------------------------   Assessment and Plan:  Pregnancy: [id number] at [redacted]w[redacted]d 1. Encounter for supervision of other normal pregnancy in first trimester Patient is doing well without complaints  2. Obesity in pregnancy Patient to start ASA  3. Alpha thalassemia silent carrier   Preterm labor symptoms and general obstetric precautions including but not limited to vaginal bleeding, contractions, leaking of fluid and fetal movement were reviewed in detail with the patient. I discussed the assessment and treatment plan with the patient. The patient was provided an opportunity to ask questions and all were answered. The patient agreed with the plan and demonstrated an understanding of the instructions. The patient was advised to call back or seek an in-person office evaluation/go to [facility] at [facility] for any urgent or concerning symptoms. Please refer to After Visit Summary for other counseling recommendations.   I provided 15 minutes of face-to-face time during this encounter.  Return in about 4 weeks (around 04/22/2021) for in person, ROB, Low risk, AFP.  Future Appointments  Date Time Provider Department Center  03/25/2021  9:35 AM Latajah Thuman, Gigi Gin, MD [facility] None  04/22/2021 10:15 AM [facility] NURSE [facility]  04/22/2021 10:30 AM [facility] US3 [facility]    Catalina Antigua, MD Center for [employer], [facility]

## 2021-03-30 ENCOUNTER — Telehealth: Payer: Self-pay | Admitting: *Deleted

## 2021-03-30 DIAGNOSIS — R0781 Pleurodynia: Secondary | ICD-10-CM | POA: Diagnosis not present

## 2021-03-30 NOTE — Telephone Encounter (Signed)
Pt called to office with breast concern. Pt states she normally wears underwire bras, has recently had an area that appears a little swollen and somewhat painful. Pt denies any external skin conditions/changes. Advised pt to wear wireless bra or sports bra type, Tylenol as needed and warm compresses to area.  If no relief or symptoms worsen, she should be seen by PCP or urgent care as no in office appt available.   Pt states understanding.

## 2021-04-01 NOTE — Telephone Encounter (Signed)
error 

## 2021-04-02 ENCOUNTER — Other Ambulatory Visit: Payer: Self-pay

## 2021-04-02 ENCOUNTER — Encounter (HOSPITAL_COMMUNITY): Payer: Self-pay | Admitting: Obstetrics and Gynecology

## 2021-04-02 ENCOUNTER — Inpatient Hospital Stay (HOSPITAL_COMMUNITY)
Admission: AD | Admit: 2021-04-02 | Discharge: 2021-04-02 | Disposition: A | Discharge status: Home / Self Care | Payer: BC Managed Care – PPO | Attending: Obstetrics and Gynecology | Admitting: Obstetrics and Gynecology

## 2021-04-02 DIAGNOSIS — Z3A16 16 weeks gestation of pregnancy: Secondary | ICD-10-CM | POA: Diagnosis not present

## 2021-04-02 DIAGNOSIS — O99012 Anemia complicating pregnancy, second trimester: Secondary | ICD-10-CM | POA: Diagnosis not present

## 2021-04-02 DIAGNOSIS — D563 Thalassemia minor: Secondary | ICD-10-CM

## 2021-04-02 DIAGNOSIS — R102 Pelvic and perineal pain: Secondary | ICD-10-CM | POA: Insufficient documentation

## 2021-04-02 DIAGNOSIS — O26892 Other specified pregnancy related conditions, second trimester: Secondary | ICD-10-CM | POA: Insufficient documentation

## 2021-04-02 DIAGNOSIS — E860 Dehydration: Secondary | ICD-10-CM

## 2021-04-02 DIAGNOSIS — Z3481 Encounter for supervision of other normal pregnancy, first trimester: Secondary | ICD-10-CM

## 2021-04-02 DIAGNOSIS — O99282 Endocrine, nutritional and metabolic diseases complicating pregnancy, second trimester: Secondary | ICD-10-CM | POA: Insufficient documentation

## 2021-04-02 DIAGNOSIS — R109 Unspecified abdominal pain: Secondary | ICD-10-CM | POA: Diagnosis not present

## 2021-04-02 LAB — COMPREHENSIVE METABOLIC PANEL
ALT: 21 U/L (ref 0–44)
AST: 18 U/L (ref 15–41)
Albumin: 3 g/dL — ABNORMAL LOW (ref 3.5–5.0)
Alkaline Phosphatase: 45 U/L (ref 38–126)
Anion gap: 8 (ref 5–15)
BUN: 7 mg/dL (ref 6–20)
CO2: 20 mmol/L — ABNORMAL LOW (ref 22–32)
Calcium: 8.8 mg/dL — ABNORMAL LOW (ref 8.9–10.3)
Chloride: 106 mmol/L (ref 98–111)
Creatinine, Ser: 0.64 mg/dL (ref 0.44–1.00)
GFR, Estimated: >60 mL/min (ref >60)
Glucose, Bld: 96 mg/dL (ref 70–99)
Potassium: 3.7 mmol/L (ref 3.5–5.1)
Sodium: 134 mmol/L — ABNORMAL LOW (ref 135–145)
Total Bilirubin: 0.2 mg/dL — ABNORMAL LOW (ref 0.3–1.2)
Total Protein: 6.4 g/dL — ABNORMAL LOW (ref 6.5–8.1)

## 2021-04-02 LAB — CBC
HCT: 34.7 % — ABNORMAL LOW (ref 36.0–46.0)
Hemoglobin: 10.9 g/dL — ABNORMAL LOW (ref 12.0–15.0)
MCH: 25.7 pg — ABNORMAL LOW (ref 26.0–34.0)
MCHC: 31.4 g/dL (ref 30.0–36.0)
MCV: 81.8 fL (ref 80.0–100.0)
Platelets: 229 10*3/uL (ref 150–400)
RBC: 4.24 MIL/uL (ref 3.87–5.11)
RDW: 14.6 % (ref 11.5–15.5)
WBC: 6.8 10*3/uL (ref 4.0–10.5)
nRBC: 0 % (ref 0.0–0.2)

## 2021-04-02 LAB — WET PREP, GENITAL
Sperm: NONE SEEN
Trich, Wet Prep: NONE SEEN
WBC, Wet Prep HPF POC: <10 (ref <10)
Yeast Wet Prep HPF POC: NONE SEEN

## 2021-04-02 LAB — URINALYSIS, ROUTINE W REFLEX MICROSCOPIC
Bilirubin Urine: NEGATIVE
Glucose, UA: NEGATIVE mg/dL
Hgb urine dipstick: NEGATIVE
Ketones, ur: NEGATIVE mg/dL
Leukocytes,Ua: NEGATIVE
Nitrite: NEGATIVE
Protein, ur: NEGATIVE mg/dL
Specific Gravity, Urine: >1.03 — ABNORMAL HIGH (ref 1.005–1.030)
pH: 5.5 (ref 5.0–8.0)

## 2021-04-02 MED ORDER — ACETAMINOPHEN 500 MG PO TABS
1000.0000 mg | ORAL_TABLET | Freq: Four times a day (QID) | ORAL | Status: DC | PRN
  Administered 2021-04-02: 09:00:00 1000 mg via ORAL
  Filled 2021-04-02: qty 2

## 2021-04-02 NOTE — MAU Provider Note (Signed)
History     CSN: [id number]  Arrival date and time: 04/02/21 4098   Event Date/Time   First Provider Initiated Contact with Patient 04/02/21 0818      Chief Complaint  Patient presents with   Abdominal Pain   Pelvic Pressure   25 y.o. [id number] _0 .4 wks presenting with abdominal pain and pelvic pressure. Pelvic pressure started 1 week ago. Abdominal pain started 3 days ago. Describes as sharp, intermittent in both upper quadrants. Rates pain 10/10. Has not treated it. Denies N/V/D/C. Last BM yesterday. Denies heartburn or indigestion. She is eating and drinking well. Denies VB, discharge, odor, or itching. Also reports feeling something tender and bulging below rib cage on left, first noticed months ago.    OB History     Gravida  4   Para  2   Term  2   Preterm  0   AB  1   Living  2      SAB  0   IAB  1   Ectopic  0   Multiple      Live Births  2           Past Medical History:  Diagnosis Date   COVID 11/2019   fever x 2 days all symptoms resolved   Headache    Vitamin D deficiency     Past Surgical History:  Procedure Laterality Date   DILATION AND EVACUATION N/A 08/21/2020   Procedure: DILATATION AND EVACUATION;  Surgeon: Janyth Contes, MD;  Location: [place];  Service: Gynecology;  Laterality: N/A;    Family History  Problem Relation Age of Onset   Healthy Mother    Breast cancer Maternal Grandmother    Diabetes Maternal Uncle     Social History   Tobacco Use   Smoking status: Never   Smokeless tobacco: Never  Vaping Use   Vaping Use: Never used  Substance Use Topics   Alcohol use: Not Currently   Drug use: Not Currently    Allergies:  Allergies  Allergen Reactions   Amoxicillin Rash    No medications prior to admission.    Review of Systems  Constitutional:  Negative for fever.  Gastrointestinal:  Positive for abdominal pain. Negative for constipation, diarrhea, nausea and vomiting.   Genitourinary:  Positive for pelvic pain. Negative for dysuria, frequency, urgency, vaginal bleeding and vaginal discharge.  Physical Exam   Blood pressure 111/63, pulse 82, temperature 98.5 F (36.9 C), temperature source Oral, resp. rate 20, height 5' 7" (1.702 m), weight 99.9 kg, last menstrual period 11/15/2020, SpO2 100 %.  Physical Exam Vitals and nursing note reviewed. Exam conducted with a chaperone present.  Constitutional:      General: She is not in acute distress.    Appearance: Normal appearance.  HENT:     Head: Normocephalic and atraumatic.  Cardiovascular:     Rate and Rhythm: Normal rate.  Pulmonary:     Effort: Pulmonary effort is normal. No respiratory distress.  Chest:    Abdominal:     General: There is no distension.     Palpations: Abdomen is soft. There is no mass.     Tenderness: There is no abdominal tenderness. There is no guarding or rebound.     Hernia: No hernia is present.  Genitourinary:    Comments: VE: closed/long Musculoskeletal:        General: Normal range of motion.     Cervical back: Normal range of motion.  Skin:  General: Skin is warm and dry.  Neurological:     General: No focal deficit present.     Mental Status: She is alert and oriented to person, place, and time.  Psychiatric:        Mood and Affect: Mood normal.        Behavior: Behavior normal.  FHT 141  Results for orders placed or performed during the hospital encounter of 04/02/21 (from the past 24 hour(s))  Wet prep, genital     Status: Abnormal   Collection Time: 04/02/21  8:31 AM   Specimen: PATH Cytology Cervicovaginal Ancillary Only  Result Value Ref Range   Yeast Wet Prep HPF POC NONE SEEN NONE SEEN   Trich, Wet Prep NONE SEEN NONE SEEN   Clue Cells Wet Prep HPF POC PRESENT (A) NONE SEEN   WBC, Wet Prep HPF POC <10 <10   Sperm NONE SEEN   Urinalysis, Routine w reflex microscopic Urine, Clean Catch     Status: Abnormal   Collection Time: 04/02/21  8:35 AM   Result Value Ref Range   Color, Urine YELLOW YELLOW   APPearance CLEAR CLEAR   Specific Gravity, Urine >1.030 (H) 1.005 - 1.030   pH 5.5 5.0 - 8.0   Glucose, UA NEGATIVE NEGATIVE mg/dL   Hgb urine dipstick NEGATIVE NEGATIVE   Bilirubin Urine NEGATIVE NEGATIVE   Ketones, ur NEGATIVE NEGATIVE mg/dL   Protein, ur NEGATIVE NEGATIVE mg/dL   Nitrite NEGATIVE NEGATIVE   Leukocytes,Ua NEGATIVE NEGATIVE  CBC     Status: Abnormal   Collection Time: 04/02/21  8:45 AM  Result Value Ref Range   WBC 6.8 4.0 - 10.5 K/uL   RBC 4.24 3.87 - 5.11 MIL/uL   Hemoglobin 10.9 (L) 12.0 - 15.0 g/dL   HCT 34.7 (L) 36.0 - 46.0 %   MCV 81.8 80.0 - 100.0 fL   MCH 25.7 (L) 26.0 - 34.0 pg   MCHC 31.4 30.0 - 36.0 g/dL   RDW 14.6 11.5 - 15.5 %   Platelets 229 150 - 400 K/uL   nRBC 0.0 0.0 - 0.2 %  Comprehensive metabolic panel     Status: Abnormal   Collection Time: 04/02/21  8:45 AM  Result Value Ref Range   Sodium 134 (L) 135 - 145 mmol/L   Potassium 3.7 3.5 - 5.1 mmol/L   Chloride 106 98 - 111 mmol/L   CO2 20 (L) 22 - 32 mmol/L   Glucose, Bld 96 70 - 99 mg/dL   BUN 7 6 - 20 mg/dL   Creatinine, Ser 0.64 0.44 - 1.00 mg/dL   Calcium 8.8 (L) 8.9 - 10.3 mg/dL   Total Protein 6.4 (L) 6.5 - 8.1 g/dL   Albumin 3.0 (L) 3.5 - 5.0 g/dL   AST 18 15 - 41 U/L   ALT 21 0 - 44 U/L   Alkaline Phosphatase 45 38 - 126 U/L   Total Bilirubin 0.2 (L) 0.3 - 1.2 mg/dL   GFR, Estimated >60 >60 mL/min   Anion gap 8 5 - 15    MAU Course  Procedures Tylenol  MDM Labs ordered and reviewed. No signs of emergent condition. Labs normal other than mild anemia and dehydration. Recommend PNV w/Fe, Fe rich foods and 6 bottles h2o daily. LAP is likely RL/MSK and discussed comfort measures. Upper abd pain is likely gas pain. Discussed avoid gas producing foods and increase fiber for more regular BMs. Stable for discharge home.   Assessment and Plan   1. Encounter  for supervision of other normal pregnancy in first trimester    2. Alpha thalassemia silent carrier   3. [redacted] weeks gestation of pregnancy   4. Anemia during pregnancy in second trimester   5. Gastric pain   6. Dehydration   7. Pain of round ligament affecting pregnancy, antepartum    Discharge home [facility] at [facility] as scheduled Return precautions  Allergies as of 04/02/2021       Reactions   Amoxicillin Rash        Medication List     STOP taking these medications    promethazine 25 MG tablet Commonly known as: PHENERGAN   vitamin B-6 25 MG tablet Commonly known as: pyridOXINE       TAKE these medications    acetaminophen 500 MG tablet Commonly known as: TYLENOL Take 500 mg by mouth every 6 (six) hours as needed.   aspirin 81 MG chewable tablet Chew 1 tablet (81 mg total) by mouth daily.   Blood Pressure Kit Devi Please check blood pressure 1-2 per week or if you are not feeling well.   Blood Pressure Monitor Devi Please check blood pressure 1-2 times per week,   Vitafol FE+ 90-1-200 & 50 MG Cppk Take 1 tablet by mouth daily.        Julianne Handler, CNM 04/02/2021, 9:46 AM

## 2021-04-02 NOTE — MAU Note (Addendum)
Presents with c/o pelvic pressure x1 week and sharp intermittent abdominal pain x 3 days.  Denies VB or LOF.  Hasn't taken any meds to treat pain.

## 2021-04-06 ENCOUNTER — Ambulatory Visit: Payer: BC Managed Care – PPO

## 2021-04-06 LAB — GC/CHLAMYDIA PROBE AMP (~~LOC~~) NOT AT ARMC
Chlamydia: NEGATIVE
Comment: NEGATIVE
Comment: NORMAL
Neisseria Gonorrhea: NEGATIVE

## 2021-04-11 NOTE — L&D Delivery Note (Signed)
OB/GYN Faculty Practice Delivery Note  Wendy Gutierrez is a 26 y.o. [id number] s/p vag del at [redacted]w[redacted]d. She was admitted for elective IOL.   ROM: 2h 11m with clear fluid GBS Status: neg Maximum Maternal Temperature: 98.9  Labor Progress: [name] was admitted for an elective induction. She received one dose of cytotec and then had her membranes ruptured followed by progression to complete.  Delivery Date/Time: June 8th, 2023 at 0009 Delivery: Called to room and patient was complete and pushing. Head delivered ROA. No nuchal cord present. Shoulder and body delivered in usual fashion. Infant with spontaneous cry, placed on mother's abdomen, dried and stimulated. Cord clamped x 2 after 1-minute delay, and cut by CNM. Cord blood drawn. Placenta delivered spontaneously with gentle cord traction. Fundus firm with massage and Pitocin. Labia, perineum, vagina, and cervix inspected and found to be intact.   Placenta: spont, intact; to L&D Complications: none Lacerations: none EBL: 100cc Analgesia: epidural  Postpartum Planning [x]  message to sent to schedule follow-up   Infant: boy  APGARs 8/8  3810g (8lb 6.4oz)  10/8, CNM  09/16/2021 12:37 AM

## 2021-04-20 ENCOUNTER — Ambulatory Visit: Payer: BC Managed Care – PPO

## 2021-04-22 ENCOUNTER — Other Ambulatory Visit: Payer: Self-pay | Admitting: *Deleted

## 2021-04-22 ENCOUNTER — Encounter: Payer: Self-pay | Admitting: *Deleted

## 2021-04-22 ENCOUNTER — Ambulatory Visit: Payer: BC Managed Care – PPO | Admitting: *Deleted

## 2021-04-22 ENCOUNTER — Other Ambulatory Visit: Payer: Self-pay

## 2021-04-22 ENCOUNTER — Ambulatory Visit: Payer: BC Managed Care – PPO

## 2021-04-22 VITALS — BP 119/63 | HR 79

## 2021-04-22 DIAGNOSIS — O99212 Obesity complicating pregnancy, second trimester: Secondary | ICD-10-CM | POA: Diagnosis not present

## 2021-04-22 DIAGNOSIS — Z148 Genetic carrier of other disease: Secondary | ICD-10-CM | POA: Insufficient documentation

## 2021-04-22 DIAGNOSIS — Z363 Encounter for antenatal screening for malformations: Secondary | ICD-10-CM | POA: Diagnosis not present

## 2021-04-22 DIAGNOSIS — Z3400 Encounter for supervision of normal first pregnancy, unspecified trimester: Secondary | ICD-10-CM | POA: Diagnosis not present

## 2021-04-22 DIAGNOSIS — Z3A19 19 weeks gestation of pregnancy: Secondary | ICD-10-CM | POA: Insufficient documentation

## 2021-04-22 DIAGNOSIS — D563 Thalassemia minor: Secondary | ICD-10-CM

## 2021-04-22 DIAGNOSIS — Z3A11 11 weeks gestation of pregnancy: Secondary | ICD-10-CM | POA: Diagnosis not present

## 2021-04-22 DIAGNOSIS — Z362 Encounter for other antenatal screening follow-up: Secondary | ICD-10-CM

## 2021-04-29 ENCOUNTER — Encounter: Payer: Self-pay | Admitting: Obstetrics

## 2021-04-29 ENCOUNTER — Ambulatory Visit (INDEPENDENT_AMBULATORY_CARE_PROVIDER_SITE_OTHER): Payer: BC Managed Care – PPO | Admitting: Obstetrics

## 2021-04-29 ENCOUNTER — Other Ambulatory Visit: Payer: Self-pay

## 2021-04-29 VITALS — BP 98/71 | HR 82 | Wt 226.0 lb

## 2021-04-29 DIAGNOSIS — Z3480 Encounter for supervision of other normal pregnancy, unspecified trimester: Secondary | ICD-10-CM | POA: Diagnosis not present

## 2021-04-29 NOTE — Progress Notes (Signed)
Subjective:    Wendy Gutierrez is being seen today for her first obstetrical visit.  This is not a planned pregnancy. She is at [redacted]w[redacted]d gestation. Her obstetrical history is significant for  none . Relationship with FOB: significant other, not living together. Patient does intend to breast feed. Pregnancy history fully reviewed.  The information documented in the HPI was reviewed and verified.  Menstrual History: OB History     Gravida  4   Para  2   Term  2   Preterm  0   AB  1   Living  2      SAB  0   IAB  1   Ectopic  0   Multiple      Live Births  2           Patient's last menstrual period was 11/15/2020.    Past Medical History:  Diagnosis Date   COVID 11/2019   fever x 2 days all symptoms resolved   Headache    Vitamin D deficiency     Past Surgical History:  Procedure Laterality Date   DILATION AND EVACUATION N/A 08/21/2020   Procedure: DILATATION AND EVACUATION;  Surgeon: Sherian Rein, MD;  Location: [facility];  Service: Gynecology;  Laterality: N/A;    (Not in a hospital admission)  Allergies  Allergen Reactions   Amoxicillin Rash    Social History   Tobacco Use   Smoking status: Never   Smokeless tobacco: Never  Substance Use Topics   Alcohol use: Not Currently    Family History  Problem Relation Age of Onset   Healthy Mother    Breast cancer Maternal Grandmother    Diabetes Maternal Uncle      Review of Systems Constitutional: negative for weight loss Gastrointestinal: negative for vomiting Genitourinary:negative for genital lesions and vaginal discharge and dysuria Musculoskeletal:negative for back pain Behavioral/Psych: negative for abusive relationship, depression, illegal drug usage and tobacco use    Objective:    BP 98/71    Pulse 82    Wt 226 lb (102.5 kg)    LMP 11/15/2020    BMI 35.40 kg/m  General Appearance:    Alert, cooperative, no distress, appears stated age  Head:     Normocephalic, without obvious abnormality, atraumatic  Eyes:    PERRL, conjunctiva/corneas clear, EOM's intact, fundi    benign, both eyes  Ears:    Normal TM's and external ear canals, both ears  Nose:   Nares normal, septum midline, mucosa normal, no drainage    or sinus tenderness  Throat:   Lips, mucosa, and tongue normal; teeth and gums normal  Neck:   Supple, symmetrical, trachea midline, no adenopathy;    thyroid:  no enlargement/tenderness/nodules; no carotid   bruit or JVD  Back:     Symmetric, no curvature, ROM normal, no CVA tenderness  Lungs:     Clear to auscultation bilaterally, respirations unlabored  Chest Wall:    No tenderness or deformity   Heart:    Regular rate and rhythm, S1 and S2 normal, no murmur, rub   or gallop  Breast Exam:    No tenderness, masses, or nipple abnormality  Abdomen:     Soft, non-tender, bowel sounds active all four quadrants,    no masses, no organomegaly  Genitalia:    Normal female without lesion, discharge or tenderness  Extremities:   Extremities normal, atraumatic, no cyanosis or edema  Pulses:   2+ and symmetric all extremities  Skin:   Skin color, texture, turgor normal, no rashes or lesions  Lymph nodes:   Cervical, supraclavicular, and axillary nodes normal  Neurologic:   CNII-XII intact, normal strength, sensation and reflexes    throughout      Lab Review Urine pregnancy test Labs reviewed yes Radiologic studies reviewed no  Assessment:    Pregnancy at [redacted]w[redacted]d weeks    Plan:    1. Supervision of other normal pregnancy, antepartum Rx: - AFP, Serum, Open Spina Bifida   Prenatal vitamins.  Counseling provided regarding continued use of seat belts, cessation of alcohol consumption, smoking or use of illicit drugs; infection precautions i.e., influenza/TDAP immunizations, toxoplasmosis,CMV, parvovirus, listeria and varicella; workplace safety, exercise during pregnancy; routine dental care, safe medications, sexual activity,  hot tubs, saunas, pools, travel, caffeine use, fish and methlymercury, potential toxins, hair treatments, varicose veins Weight gain recommendations per IOM guidelines reviewed: underweight/BMI< 18.5--> gain 28 - 40 lbs; normal weight/BMI 18.5 - 24.9--> gain 25 - 35 lbs; overweight/BMI 25 - 29.9--> gain 15 - 25 lbs; obese/BMI >30->gain  11 - 20 lbs Problem list reviewed and updated. FIRST/CF mutation testing/NIPT/QUAD SCREEN/fragile X/Ashkenazi Jewish population testing/Spinal muscular atrophy discussed: requested. Role of ultrasound in pregnancy discussed; fetal survey: requested. Amniocentesis discussed: not indicated.  No orders of the defined types were placed in this encounter.  Orders Placed This Encounter  Procedures   AFP, Serum, Open Spina Bifida    Order Specific Question:   Is patient insulin dependent?    Answer:   No    Order Specific Question:   Gestational Age (GA), weeks    Answer:   21.3    Order Specific Question:   Date on which patient was at this GA    Answer:   04/29/2021    Order Specific Question:   GA Calculation Method    Answer:   Ultrasound    Order Specific Question:   GA Date    Answer:   09/13/2021    Order Specific Question:   Number of fetuses    Answer:   1    Follow up in 4 weeks.  Brock Bad, MD 04/29/2021 2:06 PM

## 2021-04-29 NOTE — Progress Notes (Signed)
Pt complains of pelvic pain/pressure.

## 2021-05-01 LAB — AFP, SERUM, OPEN SPINA BIFIDA
AFP MoM: 1.03
AFP Value: 54.7 ng/mL
Gest. Age on Collection Date: 20.3 weeks
Maternal Age At EDD: 28.1 yr
OSBR Risk 1 IN: 10000
Test Results:: NEGATIVE
Weight: 226 [lb_av]

## 2021-05-20 ENCOUNTER — Ambulatory Visit: Payer: BC Managed Care – PPO | Attending: Obstetrics

## 2021-05-20 ENCOUNTER — Other Ambulatory Visit: Payer: Self-pay

## 2021-05-20 ENCOUNTER — Other Ambulatory Visit: Payer: Self-pay | Admitting: *Deleted

## 2021-05-20 ENCOUNTER — Encounter: Payer: Self-pay | Admitting: *Deleted

## 2021-05-20 ENCOUNTER — Ambulatory Visit: Payer: BC Managed Care – PPO | Admitting: *Deleted

## 2021-05-20 VITALS — BP 118/52 | HR 79

## 2021-05-20 DIAGNOSIS — E669 Obesity, unspecified: Secondary | ICD-10-CM | POA: Diagnosis not present

## 2021-05-20 DIAGNOSIS — O99212 Obesity complicating pregnancy, second trimester: Secondary | ICD-10-CM

## 2021-05-20 DIAGNOSIS — D563 Thalassemia minor: Secondary | ICD-10-CM

## 2021-05-20 DIAGNOSIS — Z3A23 23 weeks gestation of pregnancy: Secondary | ICD-10-CM

## 2021-05-20 DIAGNOSIS — Z362 Encounter for other antenatal screening follow-up: Secondary | ICD-10-CM | POA: Diagnosis not present

## 2021-05-27 ENCOUNTER — Encounter: Payer: BC Managed Care – PPO | Admitting: Nurse Practitioner

## 2021-05-29 ENCOUNTER — Inpatient Hospital Stay (HOSPITAL_COMMUNITY)
Admission: AD | Admit: 2021-05-29 | Discharge: 2021-05-29 | Disposition: A | Discharge status: Home / Self Care | Payer: BC Managed Care – PPO | Attending: Obstetrics & Gynecology | Admitting: Obstetrics & Gynecology

## 2021-05-29 ENCOUNTER — Other Ambulatory Visit: Payer: Self-pay

## 2021-05-29 ENCOUNTER — Encounter (HOSPITAL_COMMUNITY): Payer: Self-pay | Admitting: Obstetrics & Gynecology

## 2021-05-29 DIAGNOSIS — N760 Acute vaginitis: Secondary | ICD-10-CM | POA: Diagnosis not present

## 2021-05-29 DIAGNOSIS — B9689 Other specified bacterial agents as the cause of diseases classified elsewhere: Secondary | ICD-10-CM | POA: Diagnosis not present

## 2021-05-29 DIAGNOSIS — Z8616 Personal history of COVID-19: Secondary | ICD-10-CM | POA: Insufficient documentation

## 2021-05-29 DIAGNOSIS — O23592 Infection of other part of genital tract in pregnancy, second trimester: Secondary | ICD-10-CM | POA: Insufficient documentation

## 2021-05-29 DIAGNOSIS — Z3A24 24 weeks gestation of pregnancy: Secondary | ICD-10-CM | POA: Diagnosis not present

## 2021-05-29 DIAGNOSIS — O26852 Spotting complicating pregnancy, second trimester: Secondary | ICD-10-CM | POA: Insufficient documentation

## 2021-05-29 LAB — URINALYSIS, ROUTINE W REFLEX MICROSCOPIC
Bacteria, UA: NONE SEEN
Bilirubin Urine: NEGATIVE
Glucose, UA: NEGATIVE mg/dL
Ketones, ur: NEGATIVE mg/dL
Leukocytes,Ua: NEGATIVE
Nitrite: NEGATIVE
Protein, ur: NEGATIVE mg/dL
RBC / HPF: >50 RBC/hpf — ABNORMAL HIGH (ref 0–5)
Specific Gravity, Urine: 1.024 (ref 1.005–1.030)
pH: 6 (ref 5.0–8.0)

## 2021-05-29 LAB — WET PREP, GENITAL
Sperm: NONE SEEN
Trich, Wet Prep: NONE SEEN
WBC, Wet Prep HPF POC: >=10 — AB (ref <10)
Yeast Wet Prep HPF POC: NONE SEEN

## 2021-05-29 MED ORDER — METRONIDAZOLE 500 MG PO TABS
500.0000 mg | ORAL_TABLET | Freq: Two times a day (BID) | ORAL | 0 refills | Status: DC
Start: 2021-05-29 — End: 2021-06-17

## 2021-05-29 NOTE — MAU Provider Note (Signed)
History     CSN: [id number]  Arrival date and time: 05/29/21 1350   Event Date/Time   First Provider Initiated Contact with Patient 05/29/21 1430      Chief Complaint  Patient presents with   Vaginal Bleeding   HPI Wendy Gutierrez is a 26 y.o. [id number] at 18w5dwho presents with vaginal bleeding. She states she woke up today and saw spotting when she wiped. She denies any pain. She is not having to wear a pad. She denies abnormal discharge or leaking of fluid. She reports normal fetal movement.   OB History     Gravida  4   Para  2   Term  2   Preterm  0   AB  1   Living  2      SAB  0   IAB  1   Ectopic  0   Multiple      Live Births  2           Past Medical History:  Diagnosis Date   COVID 11/2019   fever x 2 days all symptoms resolved   Headache    Vitamin D deficiency     Past Surgical History:  Procedure Laterality Date   DILATION AND EVACUATION N/A 08/21/2020   Procedure: DILATATION AND EVACUATION;  Surgeon: BJanyth Contes MD;  Location: [place]  Service: Gynecology;  Laterality: N/A;    Family History  Problem Relation Age of Onset   Healthy Mother    Breast cancer Maternal Grandmother    Diabetes Maternal Uncle     Social History   Tobacco Use   Smoking status: Never   Smokeless tobacco: Never  Vaping Use   Vaping Use: Never used  Substance Use Topics   Alcohol use: Not Currently   Drug use: Not Currently    Allergies:  Allergies  Allergen Reactions   Amoxicillin Rash    Medications Prior to Admission  Medication Sig Dispense Refill Last Dose   Prenat-FePoly-Metf-FA-DHA-DSS (VITAFOL FE+) 90-1-200 & 50 MG CPPK Take 1 tablet by mouth daily. 30 each 12 05/29/2021   acetaminophen (TYLENOL) 500 MG tablet Take 500 mg by mouth every 6 (six) hours as needed. (Patient not taking: Reported on 04/22/2021)      aspirin 81 MG chewable tablet Chew 1 tablet (81 mg total) by mouth daily. (Patient not taking:  Reported on 04/22/2021) 90 tablet 2    Blood Pressure Monitor DEVI Please check blood pressure 1-2 times per week, 1 each 0    Blood Pressure Monitoring (BLOOD PRESSURE KIT) DEVI Please check blood pressure 1-2 per week or if you are not feeling well. 1 each 0     Review of Systems  Constitutional: Negative.  Negative for fatigue and fever.  HENT: Negative.    Respiratory: Negative.  Negative for shortness of breath.   Cardiovascular: Negative.  Negative for chest pain.  Gastrointestinal: Negative.  Negative for abdominal pain, constipation, diarrhea, nausea and vomiting.  Genitourinary:  Positive for vaginal bleeding. Negative for dysuria.  Neurological: Negative.  Negative for dizziness and headaches.  Physical Exam   Blood pressure (!) 114/43, pulse 81, temperature 98.6 F (37 C), temperature source Oral, resp. rate 16, weight 102.2 kg, last menstrual period 11/15/2020, SpO2 99 %.  Physical Exam Vitals and nursing note reviewed.  Constitutional:      General: She is not in acute distress.    Appearance: She is well-developed.  HENT:     Head:  Normocephalic.  Eyes:     Pupils: Pupils are equal, round, and reactive to light.  Cardiovascular:     Rate and Rhythm: Normal rate and regular rhythm.     Heart sounds: Normal heart sounds.  Pulmonary:     Effort: Pulmonary effort is normal. No respiratory distress.     Breath sounds: Normal breath sounds.  Abdominal:     General: Bowel sounds are normal. There is no distension.     Palpations: Abdomen is soft.     Tenderness: There is no abdominal tenderness.  Genitourinary:    Comments: Pelvic exam: Cervix pink, visually closed, without lesion, scant white creamy discharge, vaginal walls and external genitalia normal Bimanual exam: Cervix 0/long/high, firm, anterior, neg CMT, uterus nontender, adnexa without tenderness, enlargement, or mass   Skin:    General: Skin is warm and dry.  Neurological:     Mental Status: She is alert  and oriented to person, place, and time.  Psychiatric:        Mood and Affect: Mood normal.        Behavior: Behavior normal.        Thought Content: Thought content normal.        Judgment: Judgment normal.   Fetal Tracing:  Baseline: 120 Variability: moderate Accels: 10x10 Decels: 1 variable  Toco: none  Dilation: Closed Exam by:: C. Neil,CNM  MAU Course  Procedures Results for orders placed or performed during the hospital encounter of 05/29/21 (from the past 24 hour(s))  Urinalysis, Routine w reflex microscopic     Status: Abnormal   Collection Time: 05/29/21  2:36 PM  Result Value Ref Range   Color, Urine YELLOW YELLOW   APPearance HAZY (A) CLEAR   Specific Gravity, Urine 1.024 1.005 - 1.030   pH 6.0 5.0 - 8.0   Glucose, UA NEGATIVE NEGATIVE mg/dL   Hgb urine dipstick LARGE (A) NEGATIVE   Bilirubin Urine NEGATIVE NEGATIVE   Ketones, ur NEGATIVE NEGATIVE mg/dL   Protein, ur NEGATIVE NEGATIVE mg/dL   Nitrite NEGATIVE NEGATIVE   Leukocytes,Ua NEGATIVE NEGATIVE   RBC / HPF >50 (H) 0 - 5 RBC/hpf   WBC, UA 0-5 0 - 5 WBC/hpf   Bacteria, UA NONE SEEN NONE SEEN   Squamous Epithelial / LPF 0-5 0 - 5   Mucus PRESENT   Wet prep, genital     Status: Abnormal   Collection Time: 05/29/21  2:36 PM  Result Value Ref Range   Yeast Wet Prep HPF POC NONE SEEN NONE SEEN   Trich, Wet Prep NONE SEEN NONE SEEN   Clue Cells Wet Prep HPF POC PRESENT (A) NONE SEEN   WBC, Wet Prep HPF POC >=10 (A) <10   Sperm NONE SEEN     MDM UA Wet prep and gc/chlamydia  Assessment and Plan   1. Bacterial vaginosis   2. [redacted] weeks gestation of pregnancy    -Discharge home in stable condition -Rx for metronidazole sent to patient's pharmacy -Second trimester precautions discussed -Patient advised to follow-up with OB as scheduled for prenatal care -Patient may return to MAU as needed or if her condition were to change or worsen   Wende Mott CNM 05/29/2021, 2:30 PM

## 2021-05-29 NOTE — MAU Note (Signed)
Patient arrived to MAU complaining of having pink spotting with streaks of blood about an hour ago after using the restroom. She is also experiencing some light cramping. Patient stated she had intercourse about 3 days ago.  Denies leakage of fluid, and/or contractions.+ FM reported.

## 2021-05-31 LAB — GC/CHLAMYDIA PROBE AMP (~~LOC~~) NOT AT ARMC
Chlamydia: NEGATIVE
Comment: NEGATIVE
Comment: NORMAL
Neisseria Gonorrhea: NEGATIVE

## 2021-06-04 ENCOUNTER — Encounter: Payer: BC Managed Care – PPO | Admitting: Obstetrics and Gynecology

## 2021-06-04 ENCOUNTER — Ambulatory Visit (INDEPENDENT_AMBULATORY_CARE_PROVIDER_SITE_OTHER): Payer: BC Managed Care – PPO | Admitting: Obstetrics and Gynecology

## 2021-06-04 ENCOUNTER — Encounter: Payer: Self-pay | Admitting: Obstetrics and Gynecology

## 2021-06-04 ENCOUNTER — Other Ambulatory Visit: Payer: Self-pay

## 2021-06-04 VITALS — BP 99/64 | HR 82 | Wt 227.1 lb

## 2021-06-04 DIAGNOSIS — D563 Thalassemia minor: Secondary | ICD-10-CM

## 2021-06-04 DIAGNOSIS — Z3009 Encounter for other general counseling and advice on contraception: Secondary | ICD-10-CM | POA: Insufficient documentation

## 2021-06-04 DIAGNOSIS — Z3481 Encounter for supervision of other normal pregnancy, first trimester: Secondary | ICD-10-CM

## 2021-06-04 NOTE — Progress Notes (Signed)
Patient presents for ROB today. No complaints. General work restrictions note given, in hand to patient.

## 2021-06-04 NOTE — Progress Notes (Signed)
Subjective:  Wendy Gutierrez is a 26 y.o. [phone] at [redacted]w[redacted]d being seen today for ongoing prenatal care.  She is currently monitored for the following issues for this low-risk pregnancy and has Migraine without aura and without status migrainosus, not intractable; Encounter for supervision of normal pregnancy in first trimester; Obesity in pregnancy; and Alpha thalassemia silent carrier on their problem list.  Patient reports general discomforts of pregnancy. Requesting work note for breaks and allowance to sit down PRN.   Contractions: Not present. Vag. Bleeding: None.  Movement: Present. Denies leaking of fluid.   The following portions of the patient's history were reviewed and updated as appropriate: allergies, current medications, past family history, past medical history, past social history, past surgical history and problem list. Problem list updated.  Objective:   Vitals:   06/04/21 1005  BP: 99/64  Pulse: 82  Weight: 227 lb 1.6 oz (103 kg)    Fetal Status: Fetal Heart Rate (bpm): 133   Movement: Present     General:  Alert, oriented and cooperative. Patient is in no acute distress.  Skin: Skin is warm and dry. No rash noted.   Cardiovascular: Normal heart rate noted  Respiratory: Normal respiratory effort, no problems with respiration noted  Abdomen: Soft, gravid, appropriate for gestational age. Pain/Pressure: Present     Pelvic:  Cervical exam deferred        Extremities: Normal range of motion.  Edema: None  Mental Status: Normal mood and affect. Normal behavior. Normal judgment and thought content.   Urinalysis:      Assessment and Plan:  Pregnancy: [id number] at [redacted]w[redacted]d  1. Encounter for supervision of other normal pregnancy in first trimester Stable Work note for breaks and sitting down provided Glucola next visit  2. Alpha thalassemia silent carrier Stable  3. Unwanted fertility Sign BTL papers at next visit Pt has Medicaid as supplement  Preterm labor symptoms  and general obstetric precautions including but not limited to vaginal bleeding, contractions, leaking of fluid and fetal movement were reviewed in detail with the patient. Please refer to After Visit Summary for other counseling recommendations.  Return in about 2 weeks (around 06/18/2021) for OB visit, face to face, any provider, fasting for Glucola.   Hermina Staggers, MD

## 2021-06-04 NOTE — Patient Instructions (Signed)

## 2021-06-17 ENCOUNTER — Other Ambulatory Visit: Payer: Self-pay

## 2021-06-17 ENCOUNTER — Encounter: Payer: Self-pay | Admitting: Family Medicine

## 2021-06-17 ENCOUNTER — Other Ambulatory Visit: Payer: BC Managed Care – PPO

## 2021-06-17 ENCOUNTER — Ambulatory Visit (INDEPENDENT_AMBULATORY_CARE_PROVIDER_SITE_OTHER): Payer: BC Managed Care – PPO | Admitting: Family Medicine

## 2021-06-17 VITALS — BP 110/73 | HR 90 | Wt 230.0 lb

## 2021-06-17 DIAGNOSIS — Z3A27 27 weeks gestation of pregnancy: Secondary | ICD-10-CM | POA: Diagnosis not present

## 2021-06-17 DIAGNOSIS — Z3480 Encounter for supervision of other normal pregnancy, unspecified trimester: Secondary | ICD-10-CM

## 2021-06-17 DIAGNOSIS — Z3009 Encounter for other general counseling and advice on contraception: Secondary | ICD-10-CM

## 2021-06-17 NOTE — Addendum Note (Signed)
Addended by: Dalphine Handing on: 06/17/2021 10:35 AM ? ? Modules accepted: Orders ? ?

## 2021-06-17 NOTE — Progress Notes (Signed)
? ? ?  Subjective:  ?Wendy Gutierrez is a 26 y.o. [phone] at [redacted]w[redacted]d being seen today for ongoing prenatal care.  She is currently monitored for the following issues for this low-risk pregnancy and has Migraine without aura and without status migrainosus, not intractable; Supervision of other normal pregnancy, antepartum; Obesity in pregnancy; Alpha thalassemia silent carrier; and Unwanted fertility on their problem list. ? ?Patient reports  lower belly pressure after being on her feet for several hours at work. Production designer, theatre/television/film at [employer]. Hasn't tried a belly band or tape yet . Has questions about ASA, never started taking it in the first trimester. Contractions: Not present. Vag. Bleeding: None.  Movement: Present. Denies leaking of fluid.  ? ?The following portions of the patient's history were reviewed and updated as appropriate: allergies, current medications, past family history, past medical history, past social history, past surgical history and problem list.  ? ?Objective:  ? ?Vitals:  ? 06/17/21 0844  ?BP: 110/73  ?Pulse: 90  ?Weight: 230 lb (104.3 kg)  ? ? ?Fetal Status: Fetal Heart Rate (bpm): 132 Fundal Height: 28 cm Movement: Present    ? ?General:  Alert, oriented and cooperative. Patient is in no acute distress.  ?Skin: Skin is warm and dry. No rash noted.   ?Cardiovascular: Normal heart rate noted  ?Respiratory: Normal respiratory effort, no problems with respiration noted  ?Abdomen: Soft, gravid, appropriate for gestational age. Pain/Pressure: Present     ?Pelvic:  Cervical exam deferred        ?Extremities: Normal range of motion.  Edema: Trace  ?Mental Status: Normal mood and affect. Normal behavior. Normal judgment and thought content.  ? ?Assessment and Plan:  ?Pregnancy: [id number] at [redacted]w[redacted]d ? ?1. Supervision of other normal pregnancy, antepartum ?Doing well with normal fetal movement. Discussed ASA and not likely to benefit if initially started now. Recommended trialing belly band and/or pregnancy tape in  addition to frequent breaks at work.  ? ?2. [redacted] weeks gestation of pregnancy ?- HIV antibody (with reflex) ?- CBC ?- RPR ?- Glucose tolerance, 2 hours ? ?3. Unwanted fertility ?Discussed postpartum vs interval BTL including risks and benefits. Additionally discussed non-hormonal reversible options as she isn't interested in hormonal BC. She is undecided and would like to think further. Re-address during next visit.   ? ?Preterm labor symptoms and general obstetric precautions including but not limited to vaginal bleeding, contractions, leaking of fluid and fetal movement were reviewed in detail with the patient. ?Please refer to After Visit Summary for other counseling recommendations.  ? ?Return in about 2 weeks (around 07/01/2021) for LROB. ? ? ?Allayne Stack, DO ?

## 2021-06-17 NOTE — Progress Notes (Signed)
Pt presents for ROB and 2 gtt labs.  ?She is unsure about BTL.  ?Pt has questions regarding ASA 81 ?TDAP offered pt declined.  ?Pt reports limping after 8 hr shift.  ?PHQ9=1 ?GAD7=2 ?

## 2021-06-18 ENCOUNTER — Other Ambulatory Visit: Payer: Self-pay | Admitting: Family Medicine

## 2021-06-18 DIAGNOSIS — D508 Other iron deficiency anemias: Secondary | ICD-10-CM

## 2021-06-18 LAB — RPR: RPR Ser Ql: NONREACTIVE

## 2021-06-18 LAB — CBC
Hematocrit: 31.4 % — ABNORMAL LOW (ref 34.0–46.6)
Hemoglobin: 9.7 g/dL — ABNORMAL LOW (ref 11.1–15.9)
MCH: 23.4 pg — ABNORMAL LOW (ref 26.6–33.0)
MCHC: 30.9 g/dL — ABNORMAL LOW (ref 31.5–35.7)
MCV: 76 fL — ABNORMAL LOW (ref 79–97)
Platelets: 247 10*3/uL (ref 150–450)
RBC: 4.15 x10E6/uL (ref 3.77–5.28)
RDW: 14.5 % (ref 11.7–15.4)
WBC: 8.2 10*3/uL (ref 3.4–10.8)

## 2021-06-18 LAB — GLUCOSE TOLERANCE, 2 HOURS W/ 1HR
Glucose, 1 hour: 140 mg/dL (ref 70–179)
Glucose, 2 hour: 141 mg/dL (ref 70–152)
Glucose, Fasting: 90 mg/dL (ref 70–91)

## 2021-06-18 LAB — HIV ANTIBODY (ROUTINE TESTING W REFLEX): HIV Screen 4th Generation wRfx: NONREACTIVE

## 2021-06-18 MED ORDER — FERROUS SULFATE 325 (65 FE) MG PO TABS: 325.0000 mg | ORAL_TABLET | ORAL | 2 refills | Status: DC

## 2021-06-28 ENCOUNTER — Telehealth: Payer: Self-pay

## 2021-06-28 NOTE — Telephone Encounter (Signed)
Returned call pt complaining of vaginal irritation, moved appt up to tomorrow. ?

## 2021-06-29 ENCOUNTER — Other Ambulatory Visit: Payer: Self-pay

## 2021-06-29 ENCOUNTER — Ambulatory Visit (INDEPENDENT_AMBULATORY_CARE_PROVIDER_SITE_OTHER): Payer: BC Managed Care – PPO | Admitting: Obstetrics and Gynecology

## 2021-06-29 ENCOUNTER — Other Ambulatory Visit (HOSPITAL_COMMUNITY)
Admission: RE | Admit: 2021-06-29 | Discharge: 2021-06-29 | Disposition: A | Discharge status: Home / Self Care | Payer: BC Managed Care – PPO | Source: Ambulatory Visit | Attending: Obstetrics | Admitting: Obstetrics

## 2021-06-29 VITALS — BP 108/67 | HR 88 | Wt 231.0 lb

## 2021-06-29 DIAGNOSIS — N898 Other specified noninflammatory disorders of vagina: Secondary | ICD-10-CM | POA: Insufficient documentation

## 2021-06-29 DIAGNOSIS — D563 Thalassemia minor: Secondary | ICD-10-CM

## 2021-06-29 DIAGNOSIS — Z3A29 29 weeks gestation of pregnancy: Secondary | ICD-10-CM

## 2021-06-29 DIAGNOSIS — Z3480 Encounter for supervision of other normal pregnancy, unspecified trimester: Secondary | ICD-10-CM

## 2021-06-29 NOTE — Progress Notes (Signed)
? ?  PRENATAL VISIT NOTE ? ?Subjective:  ?Wendy Gutierrez is a 26 y.o. [phone] at [redacted]w[redacted]d being seen today for ongoing prenatal care.  She is currently monitored for the following issues for this low-risk pregnancy and has Migraine without aura and without status migrainosus, not intractable; Supervision of other normal pregnancy, antepartum; Obesity in pregnancy; Alpha thalassemia silent carrier; and Unwanted fertility on their problem list. ? ?Patient doing well with no acute concerns today. She reports no complaints.  Contractions: Irritability. Vag. Bleeding: None.  Movement: Present. Denies leaking of fluid.  ? ?Long discussion with patient regarding her BTL.  She states the form is signed but she is still somewhat on the fence regarding the procedure.  Advised pt that she has to be 100% on board before the procedure is performed to reduce the risk of regret.   ? ?The following portions of the patient's history were reviewed and updated as appropriate: allergies, current medications, past family history, past medical history, past social history, past surgical history and problem list. Problem list updated. ? ?Objective:  ? ?Vitals:  ? 06/29/21 0845  ?BP: 108/67  ?Pulse: 88  ?Weight: 231 lb (104.8 kg)  ? ? ?Fetal Status: Fetal Heart Rate (bpm): 130 Fundal Height: 29 cm Movement: Present    ? ?General:  Alert, oriented and cooperative. Patient is in no acute distress.  ?Skin: Skin is warm and dry. No rash noted.   ?Cardiovascular: Normal heart rate noted  ?Respiratory: Normal respiratory effort, no problems with respiration noted  ?Abdomen: Soft, gravid, appropriate for gestational age.  Pain/Pressure: Present     ?Pelvic: Cervical exam deferred        ?Extremities: Normal range of motion.     ?Mental Status:  Normal mood and affect. Normal behavior. Normal judgment and thought content.  ? ?Assessment and Plan:  ?Pregnancy: [id number] at [redacted]w[redacted]d ? ?1. [redacted] weeks gestation of pregnancy ? ? ?2. Supervision of other normal  pregnancy, antepartum ?Continue routine prenatal care ? ?3. Alpha thalassemia silent carrier ? ? ?4. Vaginal irritation ?Pt will get self swab ? ?- Cervicovaginal ancillary only( [facility]) ? ?Preterm labor symptoms and general obstetric precautions including but not limited to vaginal bleeding, contractions, leaking of fluid and fetal movement were reviewed in detail with the patient. ? ?Please refer to After Visit Summary for other counseling recommendations.  ? ?Return in about 2 weeks (around 07/13/2021) for ROB, in person. ? ? ?Mariel Aloe, MD ?Faculty Attending ?[facility] ?  ?

## 2021-06-29 NOTE — Progress Notes (Signed)
Pt complains of some vaginal irritation since the weekend, will swab today. ?BTL consent completed today. ? ?

## 2021-06-30 LAB — CERVICOVAGINAL ANCILLARY ONLY
Bacterial Vaginitis (gardnerella): NEGATIVE
Candida Glabrata: NEGATIVE
Candida Vaginitis: POSITIVE — AB
Comment: NEGATIVE
Comment: NEGATIVE
Comment: NEGATIVE

## 2021-07-01 ENCOUNTER — Telehealth: Payer: Self-pay

## 2021-07-01 ENCOUNTER — Encounter: Payer: BC Managed Care – PPO | Admitting: Obstetrics

## 2021-07-01 MED ORDER — TERCONAZOLE 0.4 % VA CREA: 1.0000 | TOPICAL_CREAM | Freq: Every day | VAGINAL | 0 refills | Status: DC

## 2021-07-01 NOTE — Telephone Encounter (Signed)
S/w pt and advised of results and rx sent. 

## 2021-07-06 ENCOUNTER — Encounter: Payer: Self-pay | Admitting: Obstetrics

## 2021-07-15 ENCOUNTER — Ambulatory Visit: Payer: BC Managed Care – PPO | Attending: Obstetrics and Gynecology

## 2021-07-15 ENCOUNTER — Encounter: Payer: Self-pay | Admitting: *Deleted

## 2021-07-15 ENCOUNTER — Encounter: Payer: BC Managed Care – PPO | Admitting: Obstetrics and Gynecology

## 2021-07-15 ENCOUNTER — Ambulatory Visit: Payer: BC Managed Care – PPO | Admitting: *Deleted

## 2021-07-15 VITALS — BP 106/61 | HR 82

## 2021-07-15 DIAGNOSIS — O99212 Obesity complicating pregnancy, second trimester: Secondary | ICD-10-CM | POA: Diagnosis not present

## 2021-07-15 DIAGNOSIS — D563 Thalassemia minor: Secondary | ICD-10-CM | POA: Insufficient documentation

## 2021-07-15 DIAGNOSIS — Z362 Encounter for other antenatal screening follow-up: Secondary | ICD-10-CM | POA: Insufficient documentation

## 2021-07-15 DIAGNOSIS — E669 Obesity, unspecified: Secondary | ICD-10-CM | POA: Diagnosis not present

## 2021-07-15 DIAGNOSIS — O285 Abnormal chromosomal and genetic finding on antenatal screening of mother: Secondary | ICD-10-CM | POA: Diagnosis not present

## 2021-07-15 DIAGNOSIS — Z3A31 31 weeks gestation of pregnancy: Secondary | ICD-10-CM

## 2021-07-20 ENCOUNTER — Encounter: Payer: BC Managed Care – PPO | Admitting: Obstetrics

## 2021-07-21 ENCOUNTER — Ambulatory Visit (INDEPENDENT_AMBULATORY_CARE_PROVIDER_SITE_OTHER): Payer: BC Managed Care – PPO | Admitting: Obstetrics

## 2021-07-21 ENCOUNTER — Encounter: Payer: Self-pay | Admitting: Obstetrics

## 2021-07-21 VITALS — BP 104/64 | HR 77 | Wt 233.0 lb

## 2021-07-21 DIAGNOSIS — Z3480 Encounter for supervision of other normal pregnancy, unspecified trimester: Secondary | ICD-10-CM

## 2021-07-21 NOTE — Progress Notes (Signed)
ROB c/o "watery DC"  Denies odor, itching, burning. ?

## 2021-07-21 NOTE — Progress Notes (Signed)
Subjective:  ?Wendy Gutierrez is a 26 y.o. [phone] at [redacted]w[redacted]d being seen today for ongoing prenatal care.  She is currently monitored for the following issues for this low-risk pregnancy and has Migraine without aura and without status migrainosus, not intractable; Supervision of other normal pregnancy, antepartum; Obesity in pregnancy; Alpha thalassemia silent carrier; and Unwanted fertility on their problem list. ? ?Patient reports  increased watery discharge .  Contractions: Not present. Vag. Bleeding: None.  Movement: Present. Denies leaking of fluid.  ? ?The following portions of the patient's history were reviewed and updated as appropriate: allergies, current medications, past family history, past medical history, past social history, past surgical history and problem list. Problem list updated. ? ?Objective:  ? ?Vitals:  ? 07/21/21 1002  ?BP: 104/64  ?Pulse: 77  ?Weight: 233 lb (105.7 kg)  ? ? ?Fetal Status: Fetal Heart Rate (bpm): 137   Movement: Present    ? ?General:  Alert, oriented and cooperative. Patient is in no acute distress.  ?Skin: Skin is warm and dry. No rash noted.   ?Cardiovascular: Normal heart rate noted  ?Respiratory: Normal respiratory effort, no problems with respiration noted  ?Abdomen: Soft, gravid, appropriate for gestational age. Pain/Pressure: Present     ?Pelvic:  Cervical exam deferred        ?Extremities: Normal range of motion.  Edema: Trace  ?Mental Status: Normal mood and affect. Normal behavior. Normal judgment and thought content.  ? ?Urinalysis:     ? ?Assessment and Plan:  ?Pregnancy: [id number] at [redacted]w[redacted]d ? ?1. Supervision of other normal pregnancy, antepartum ? ? ?Preterm labor symptoms and general obstetric precautions including but not limited to vaginal bleeding, contractions, leaking of fluid and fetal movement were reviewed in detail with the patient. ?Please refer to After Visit Summary for other counseling recommendations.  ? ?Return in about 2 weeks (around 08/04/2021)  for ROB. ? ? ?Brock Bad, MD  ?

## 2021-08-04 ENCOUNTER — Ambulatory Visit (INDEPENDENT_AMBULATORY_CARE_PROVIDER_SITE_OTHER): Payer: BC Managed Care – PPO | Admitting: Obstetrics

## 2021-08-04 VITALS — BP 109/74 | HR 93 | Wt 234.2 lb

## 2021-08-04 DIAGNOSIS — Z3480 Encounter for supervision of other normal pregnancy, unspecified trimester: Secondary | ICD-10-CM

## 2021-08-04 DIAGNOSIS — J301 Allergic rhinitis due to pollen: Secondary | ICD-10-CM

## 2021-08-04 MED ORDER — LORATADINE 10 MG PO TABS: 10.0000 mg | ORAL_TABLET | Freq: Every day | ORAL | 11 refills | Status: DC

## 2021-08-04 NOTE — Progress Notes (Signed)
Subjective:  ?Wendy Gutierrez is a 26 y.o. [phone] at [redacted]w[redacted]d being seen today for ongoing prenatal care.  She is currently monitored for the following issues for this low-risk pregnancy and has Migraine without aura and without status migrainosus, not intractable; Supervision of other normal pregnancy, antepartum; Obesity in pregnancy; Alpha thalassemia silent carrier; and Unwanted fertility on their problem list. ? ?Patient reports  seasonal allergies .  Contractions: Not present. Vag. Bleeding: None.  Movement: Present. Denies leaking of fluid.  ? ?The following portions of the patient's history were reviewed and updated as appropriate: allergies, current medications, past family history, past medical history, past social history, past surgical history and problem list. Problem list updated. ? ?Objective:  ? ?Vitals:  ? 08/04/21 1010  ?BP: 109/74  ?Pulse: 93  ?Weight: 234 lb 3.2 oz (106.2 kg)  ? ? ?Fetal Status:     Movement: Present    ? ?General:  Alert, oriented and cooperative. Patient is in no acute distress.  ?Skin: Skin is warm and dry. No rash noted.   ?Cardiovascular: Normal heart rate noted  ?Respiratory: Normal respiratory effort, no problems with respiration noted  ?Abdomen: Soft, gravid, appropriate for gestational age. Pain/Pressure: Present     ?Pelvic:  Cervical exam deferred        ?Extremities: Normal range of motion.  Edema: Trace  ?Mental Status: Normal mood and affect. Normal behavior. Normal judgment and thought content.  ? ?Urinalysis:     ? ?Assessment and Plan:  ?Pregnancy: [id number] at [redacted]w[redacted]d ? ?1. Supervision of other normal pregnancy, antepartum ? ?2. Seasonal allergic rhinitis due to pollen ?Rx: ?- loratadine (CLARITIN) 10 MG tablet; Take 1 tablet (10 mg total) by mouth daily.  Dispense: 30 tablet; Refill: 11 ? ? ?Preterm labor symptoms and general obstetric precautions including but not limited to vaginal bleeding, contractions, leaking of fluid and fetal movement were reviewed in detail  with the patient. ?Please refer to After Visit Summary for other counseling recommendations.  ? ?Return in about 2 weeks (around 08/18/2021) for NOB. ? ? ? ?Brock Bad, MD ?08/04/2021 10:38 AM  ?

## 2021-08-05 ENCOUNTER — Encounter: Payer: BC Managed Care – PPO | Admitting: Obstetrics

## 2021-08-09 ENCOUNTER — Telehealth: Payer: Self-pay | Admitting: *Deleted

## 2021-08-09 NOTE — Telephone Encounter (Signed)
Pt called to office with questions about next week appt. ?Pt states she had discussed work with Dr Clearance Coots last visit.  Pt states that she works for [employer] in the [employer].  Pt made aware to continue with routine pregnancy restrictions in lifting and push/pulling.  Advised to discuss at next office visit if she feels she need to decrease hours/ shifts.   ?Advised pt if she is has ctx, leaking or signs of labor to be evaluated at hospital. ?

## 2021-08-10 ENCOUNTER — Inpatient Hospital Stay (HOSPITAL_COMMUNITY)
Admission: AD | Admit: 2021-08-10 | Discharge: 2021-08-10 | Disposition: A | Discharge status: Home / Self Care | Payer: BC Managed Care – PPO | Attending: Obstetrics & Gynecology | Admitting: Obstetrics & Gynecology

## 2021-08-10 ENCOUNTER — Telehealth: Payer: Self-pay | Admitting: *Deleted

## 2021-08-10 ENCOUNTER — Other Ambulatory Visit: Payer: Self-pay

## 2021-08-10 ENCOUNTER — Encounter (HOSPITAL_COMMUNITY): Payer: Self-pay | Admitting: Obstetrics & Gynecology

## 2021-08-10 DIAGNOSIS — Z3480 Encounter for supervision of other normal pregnancy, unspecified trimester: Secondary | ICD-10-CM

## 2021-08-10 DIAGNOSIS — N949 Unspecified condition associated with female genital organs and menstrual cycle: Secondary | ICD-10-CM | POA: Diagnosis not present

## 2021-08-10 DIAGNOSIS — Z880 Allergy status to penicillin: Secondary | ICD-10-CM | POA: Diagnosis not present

## 2021-08-10 DIAGNOSIS — D563 Thalassemia minor: Secondary | ICD-10-CM

## 2021-08-10 DIAGNOSIS — O99891 Other specified diseases and conditions complicating pregnancy: Secondary | ICD-10-CM | POA: Diagnosis not present

## 2021-08-10 DIAGNOSIS — Z3A35 35 weeks gestation of pregnancy: Secondary | ICD-10-CM | POA: Diagnosis not present

## 2021-08-10 DIAGNOSIS — Z8616 Personal history of COVID-19: Secondary | ICD-10-CM | POA: Insufficient documentation

## 2021-08-10 DIAGNOSIS — R102 Pelvic and perineal pain: Secondary | ICD-10-CM | POA: Diagnosis not present

## 2021-08-10 LAB — URINALYSIS, ROUTINE W REFLEX MICROSCOPIC
Bilirubin Urine: NEGATIVE
Glucose, UA: 50 mg/dL — AB
Hgb urine dipstick: NEGATIVE
Ketones, ur: 5 mg/dL — AB
Leukocytes,Ua: NEGATIVE
Nitrite: NEGATIVE
Protein, ur: NEGATIVE mg/dL
Specific Gravity, Urine: 1.03 (ref 1.005–1.030)
pH: 5 (ref 5.0–8.0)

## 2021-08-10 LAB — WET PREP, GENITAL
Clue Cells Wet Prep HPF POC: NONE SEEN
Sperm: NONE SEEN
Trich, Wet Prep: NONE SEEN
WBC, Wet Prep HPF POC: <10 (ref <10)
Yeast Wet Prep HPF POC: NONE SEEN

## 2021-08-10 NOTE — MAU Note (Signed)
...  Wendy Gutierrez is a 26 y.o. at [redacted]w[redacted]d here in MAU reporting: While working yesterday around 1500 she began experiencing mid lower back pain, pelvic pain, and lower abdominal pain that is worse with movement and walking. She reports the pain never ceases but it just lessens if she is sitting. She reports that around noon today she began feeling right lower back pain that is intermittent and feels tight. Denies VB or LOF. +FM. ? ?She reports she works at [employer] and is on her feet all day. She denies having a pregnancy support belt.  ? ?Onset of complaint: Yesterday around 1500. ? ?Pain score:  ?10/10 mid lower abdomen ?10/10 pelvis ?5/10 right lower back ?4/10 back ? ? ?FHT: 127 doppler ?Lab orders placed from triage: UA ? ?

## 2021-08-10 NOTE — MAU Provider Note (Addendum)
?History  ?  ? ?CSN: [id number] ? ?Arrival date and time: 08/10/21 1256 ? ? Event Date/Time  ? First Provider Initiated Contact with Patient 08/10/21 1402   ?  ? ?Chief Complaint  ?Patient presents with  ? Abdominal Pain  ? Pelvic Pain  ? ?Abdominal Pain ?Pertinent negatives include no constipation, diarrhea, fever, frequency, nausea or vomiting.  ?Pelvic Pain ?The patient's primary symptoms include pelvic pain. The patient's pertinent negatives include no vaginal discharge. Associated symptoms include abdominal pain. Pertinent negatives include no constipation, diarrhea, fever, flank pain, frequency, nausea, urgency or vomiting.  ? ?Wendy Gutierrez 26 y.o. [id number] at [redacted]w[redacted]d Presents to MAU for complaints of new onset "low back pain and pelvic cramping" that started yesterday at 3pm. She notes that it is worse when walking or standing and "eases up" while sitting. Pain is a 8/10 when walking and radiates to the "tops" of her thighs. She also endorses intermittent tightening of her low back and abdomen. Pt has attempted hot baths with no relief. OBGYN office recommended Tylenol, but has not attempted yet. She endorses +FM. Denies VB, LOF, burning with urination, frequency, or urgency.  ? ?Past Medical History:  ?Diagnosis Date  ? COVID 11/2019  ? fever x 2 days all symptoms resolved  ? Headache   ? Vitamin D deficiency   ? ? ?Past Surgical History:  ?Procedure Laterality Date  ? DILATION AND EVACUATION N/A 08/21/2020  ? Procedure: DILATATION AND EVACUATION;  Surgeon: Janyth Contes, MD;  Location: [place];  Service: Gynecology;  Laterality: N/A;  ? ? ?Family History  ?Problem Relation Age of Onset  ? Healthy Mother   ? Breast cancer Maternal Grandmother   ? Diabetes Maternal Uncle   ? ? ?Social History  ? ?Tobacco Use  ? Smoking status: Never  ? Smokeless tobacco: Never  ?Vaping Use  ? Vaping Use: Never used  ?Substance Use Topics  ? Alcohol use: Not Currently  ? Drug use: Not Currently   ? ? ?Allergies:  ?Allergies  ?Allergen Reactions  ? Amoxicillin Rash  ? ? ?Medications Prior to Admission  ?Medication Sig Dispense Refill Last Dose  ? ferrous sulfate 325 (65 FE) MG tablet Take 1 tablet (325 mg total) by mouth every other day. 30 tablet 2 Past Week  ? loratadine (CLARITIN) 10 MG tablet Take 1 tablet (10 mg total) by mouth daily. 30 tablet 11 Past Week  ? Prenat-FePoly-Metf-FA-DHA-DSS (VITAFOL FE+) 90-1-200 & 50 MG CPPK Take 1 tablet by mouth daily. 30 each 12 08/10/2021  ? Blood Pressure Monitor DEVI Please check blood pressure 1-2 times per week, (Patient not taking: Reported on 06/04/2021) 1 each 0   ? Blood Pressure Monitoring (BLOOD PRESSURE KIT) DEVI Please check blood pressure 1-2 per week or if you are not feeling well. (Patient not taking: Reported on 06/17/2021) 1 each 0   ? terconazole (TERAZOL 7) 0.4 % vaginal cream Place 1 applicator vaginally at bedtime. (Patient not taking: Reported on 07/15/2021) 45 g 0   ? ? ?Review of Systems  ?Constitutional:  Negative for fatigue and fever.  ?Respiratory:  Negative for chest tightness, shortness of breath and wheezing.   ?Cardiovascular:  Negative for chest pain.  ?Gastrointestinal:  Positive for abdominal pain. Negative for constipation, diarrhea, nausea and vomiting.  ?Genitourinary:  Positive for pelvic pain. Negative for difficulty urinating, flank pain, frequency, urgency, vaginal bleeding, vaginal discharge and vaginal pain.  ?Physical Exam  ? ?Blood pressure 135/66, pulse 94, temperature 98.6 ?F (37 ?  C), temperature source Oral, resp. rate 20, height 5' 7"  (1.702 m), weight 107.6 kg, last menstrual period 11/15/2020, SpO2 99 %. ? ?Physical Exam ?Vitals and nursing note reviewed. Exam conducted with a chaperone present.  ?Constitutional:   ?   Appearance: She is normal weight.  ?Pulmonary:  ?   Effort: Pulmonary effort is normal.  ?Abdominal:  ?   Tenderness: There is no abdominal tenderness. There is no guarding or rebound.  ?   Comments:  Pregnant   ?Genitourinary: ?   Vagina: Normal. No vaginal discharge or bleeding.  ?   Cervix: Normal.  ?   Comments: Close/ Thick/ -3 S. Rollene Rotunda CNM  ?Skin: ?   General: Skin is warm and dry.  ?   Capillary Refill: Capillary refill takes less than 2 seconds.  ?Neurological:  ?   Mental Status: She is alert.  ?Psychiatric:     ?   Mood and Affect: Mood normal.  ? ? ?MAU Course  ?Procedures ?Lab Orders    ?     Wet prep, genital    ?     Urinalysis, Routine w reflex microscopic Urine, Clean Catch    ?Results for orders placed or performed during the hospital encounter of 08/10/21 (from the past 24 hour(s))  ?Urinalysis, Routine w reflex microscopic Urine, Clean Catch     Status: Abnormal  ? Collection Time: 08/10/21  1:29 PM  ?Result Value Ref Range  ? Color, Urine YELLOW YELLOW  ? APPearance HAZY (A) CLEAR  ? Specific Gravity, Urine 1.030 1.005 - 1.030  ? pH 5.0 5.0 - 8.0  ? Glucose, UA 50 (A) NEGATIVE mg/dL  ? Hgb urine dipstick NEGATIVE NEGATIVE  ? Bilirubin Urine NEGATIVE NEGATIVE  ? Ketones, ur 5 (A) NEGATIVE mg/dL  ? Protein, ur NEGATIVE NEGATIVE mg/dL  ? Nitrite NEGATIVE NEGATIVE  ? Leukocytes,Ua NEGATIVE NEGATIVE  ?Wet prep, genital     Status: None  ? Collection Time: 08/10/21  1:50 PM  ? Specimen: Vaginal  ?Result Value Ref Range  ? Yeast Wet Prep HPF POC NONE SEEN NONE SEEN  ? Trich, Wet Prep NONE SEEN NONE SEEN  ? Clue Cells Wet Prep HPF POC NONE SEEN NONE SEEN  ? WBC, Wet Prep HPF POC <10 <10  ? Sperm NONE SEEN   ? ?MDM ?NST: 130 moderate variability +acces (-) decels. occasional contractions. Mild to palpation.  ? ?UA & wet prep normal. Low suspicion for UTI, vaginal infection. ? ?No VB, LOF, SVE: closed/ thick/ -3 by S. Rollene Rotunda CNM. Chaperone Present for exam. No evidence of onset of labor.   ? ?Assessment and Plan  ?Round Ligament Pain ?- Discomforts of pregnancy discussed.  ?- Maternity belt encouraged ?- Comfort measures discussed  ?- Follow up with Routine OB appt. ?- Discharged Home  ? ? ?Jacquiline Doe  MSN, CNM  ?08/10/2021, 2:10 PM  ?

## 2021-08-10 NOTE — Telephone Encounter (Signed)
Pt called to office stating she is having increase in pain/pressure in back and pelvic area. ?Pt states she has had some braxton hicks ctx.  Pt would like to know what to do about pain. ?Pt advised she may take 2 tylenol and drink lots of water and rest to see if any improvement.  If pain in intolerable, advised to be seen at hospital for eval as no open appt in office today.  ? ?Pt states understanding.  ?

## 2021-08-11 LAB — GC/CHLAMYDIA PROBE AMP (~~LOC~~) NOT AT ARMC
Chlamydia: NEGATIVE
Comment: NEGATIVE
Comment: NORMAL
Neisseria Gonorrhea: NEGATIVE

## 2021-08-17 ENCOUNTER — Telehealth: Payer: Self-pay

## 2021-08-17 NOTE — Telephone Encounter (Signed)
Returned call and answered pt's pregnancy related questions. Next visit is on 08/19/21 ?

## 2021-08-18 NOTE — Progress Notes (Signed)
? ?  PRENATAL VISIT NOTE ? ?Subjective:  ?Wendy Gutierrez is a 26 y.o. [phone] at [redacted]w[redacted]d being seen today for ongoing prenatal care.  She is currently monitored for the following issues for this low-risk pregnancy and has Migraine without aura and without status migrainosus, not intractable; Supervision of other normal pregnancy, antepartum; Obesity in pregnancy; Alpha thalassemia silent carrier; and Unwanted fertility on their problem list. ? ?Patient reports  back pain and lower pelvic pressure .  Contractions: Irritability. Vag. Bleeding: None.  Movement: Present. Denies leaking of fluid.  ? ?The following portions of the patient's history were reviewed and updated as appropriate: allergies, current medications, past family history, past medical history, past social history, past surgical history and problem list.  ? ?Objective:  ? ?Vitals:  ? 08/19/21 1346  ?BP: 105/61  ?Pulse: 89  ?Weight: 236 lb 3.2 oz (107.1 kg)  ? ? ?Fetal Status: Fetal Heart Rate (bpm): 130 Fundal Height: 40 cm Movement: Present  Presentation: Vertex ? ?General:  Alert, oriented and cooperative. Patient is in no acute distress.  ?Skin: Skin is warm and dry. No rash noted.   ?Cardiovascular: Normal heart rate noted  ?Respiratory: Normal respiratory effort, no problems with respiration noted  ?Abdomen: Soft, gravid, appropriate for gestational age.  Pain/Pressure: Present     ?Pelvic: Cervical exam performed in the presence of a chaperone Dilation: Fingertip      ?Extremities: Normal range of motion.  Edema: Trace  ?Mental Status: Normal mood and affect. Normal behavior. Normal judgment and thought content.  ? ?Assessment and Plan:  ?Pregnancy: [id number] at [redacted]w[redacted]d ?1. Supervision of other normal pregnancy, antepartum   ?2. Alpha thalassemia silent carrier   ?3. [redacted] weeks gestation of pregnancy   ? ?-will do GBS with reflex  ?-will do IUD in the hospital--she has Medicaid and BCBS so she knows that she may not be able to get her IUD ?-reviewed fetal  movements, signs of labor,  ? ?Term labor symptoms and general obstetric precautions including but not limited to vaginal bleeding, contractions, leaking of fluid and fetal movement were reviewed in detail with the patient. ?Please refer to After Visit Summary for other counseling recommendations.  ? ?Return in about 2 weeks (around 09/02/2021), or LROB. ? ?No future appointments. ? ? ?Starr Lake, CNM ? ?

## 2021-08-19 ENCOUNTER — Ambulatory Visit (INDEPENDENT_AMBULATORY_CARE_PROVIDER_SITE_OTHER): Payer: BC Managed Care – PPO | Admitting: Student

## 2021-08-19 VITALS — BP 105/61 | HR 89 | Wt 236.2 lb

## 2021-08-19 DIAGNOSIS — Z3480 Encounter for supervision of other normal pregnancy, unspecified trimester: Secondary | ICD-10-CM

## 2021-08-19 DIAGNOSIS — Z3A36 36 weeks gestation of pregnancy: Secondary | ICD-10-CM

## 2021-08-19 DIAGNOSIS — D563 Thalassemia minor: Secondary | ICD-10-CM

## 2021-08-19 NOTE — Progress Notes (Signed)
Pt presents for ROB. GBS to be collected today. Requested cervical check. Reports increase pelvic pain, nerve pain in legs and feet. Difficulty standing at work.  ?

## 2021-08-19 NOTE — Addendum Note (Signed)
Addended by: Sharlyne Pacas on: 08/19/2021 02:39 PM ? ? Modules accepted: Orders ? ?

## 2021-08-23 LAB — STREP GP B CULTURE+RFLX: Strep Gp B Culture+Rflx: NEGATIVE

## 2021-08-25 ENCOUNTER — Telehealth: Payer: Self-pay | Admitting: *Deleted

## 2021-08-25 NOTE — Telephone Encounter (Signed)
TC from pt concerning continued swelling in upper and lower extremities. Reassured that swelling in advanced pregnancy is common due to increased fluid volume and decreased return of fluid from extremities. Encouraged hydration and resting with feet up when possible. Pt denies HA, visual changes, dizziness, RUQ pain. Encouraged BP monitoring once daily at rest and around the same time. Advised to seek care for above symptoms or for any other emergent symptoms or feelings that "something is not right." Pt verbalized understanding and all questions answered. ?

## 2021-08-28 ENCOUNTER — Other Ambulatory Visit: Payer: Self-pay

## 2021-08-28 ENCOUNTER — Encounter (HOSPITAL_COMMUNITY): Payer: Self-pay | Admitting: Obstetrics & Gynecology

## 2021-08-28 ENCOUNTER — Inpatient Hospital Stay (HOSPITAL_COMMUNITY)
Admission: AD | Admit: 2021-08-28 | Discharge: 2021-08-28 | Disposition: A | Discharge status: Home / Self Care | Payer: BC Managed Care – PPO | Attending: Obstetrics & Gynecology | Admitting: Obstetrics & Gynecology

## 2021-08-28 DIAGNOSIS — O479 False labor, unspecified: Secondary | ICD-10-CM

## 2021-08-28 DIAGNOSIS — Z3A37 37 weeks gestation of pregnancy: Secondary | ICD-10-CM | POA: Diagnosis not present

## 2021-08-28 DIAGNOSIS — O471 False labor at or after 37 completed weeks of gestation: Secondary | ICD-10-CM | POA: Diagnosis not present

## 2021-08-28 NOTE — MAU Provider Note (Signed)
None      S: Wendy Gutierrez is a 26 y.o. [phone] at [redacted]w[redacted]d  who presents to MAU today complaining contractions q 5-10 minutes since Thursday. She denies vaginal bleeding. She denies LOF. She reports normal fetal movement.    O: BP 130/66   Pulse 79   Temp 98 F (36.7 C) (Oral)   Resp 16   Wt 108.1 kg   LMP 11/15/2020   SpO2 100%   BMI 37.32 kg/m  GENERAL: Well-developed, well-nourished female in no acute distress.  HEAD: Normocephalic, atraumatic.  CHEST: Normal effort of breathing, regular heart rate ABDOMEN: Soft, nontender, gravid  Cervical exam:  Dilation: Fingertip Effacement (%): Thick Cervical Position: Posterior Station: Ballotable Exam by:: Leafy Ro, RN   Fetal Monitoring: Baseline: 130 Variability: moderate Accelerations: 15x15 Decelerations: none Contractions: Q2-9 minutes   A: SIUP at [redacted]w[redacted]d  False labor  P: Discharge home  Reviewed labor precautions & reasons to return to MAU  Judeth Horn, NP 08/28/2021 11:08 AM

## 2021-08-28 NOTE — MAU Note (Signed)
Patient presents to MAU with c/o contractions since Thursday.  Reports increased vaginal pain and pressure since yesterday.  Also c/o increased vaginal discharge.  No gush of fluid, but feeling "moistness" last few days.  Good fetal movement.  No vaginal bleeding.

## 2021-09-02 ENCOUNTER — Ambulatory Visit (INDEPENDENT_AMBULATORY_CARE_PROVIDER_SITE_OTHER): Payer: BC Managed Care – PPO | Admitting: Obstetrics

## 2021-09-02 ENCOUNTER — Encounter: Payer: Self-pay | Admitting: Obstetrics

## 2021-09-02 VITALS — BP 116/67 | HR 82 | Wt 239.0 lb

## 2021-09-02 DIAGNOSIS — Z3480 Encounter for supervision of other normal pregnancy, unspecified trimester: Secondary | ICD-10-CM

## 2021-09-02 DIAGNOSIS — D563 Thalassemia minor: Secondary | ICD-10-CM

## 2021-09-02 NOTE — Progress Notes (Signed)
Subjective:  Wendy Gutierrez is a 26 y.o. [phone] at [redacted]w[redacted]d being seen today for ongoing prenatal care.  She is currently monitored for the following issues for this low-risk pregnancy and has Migraine without aura and without status migrainosus, not intractable; Supervision of other normal pregnancy, antepartum; Obesity in pregnancy; Alpha thalassemia silent carrier; Unwanted fertility; and Arthropathy of lumbar facet joint on their problem list.  Patient reports no complaints.  Contractions: Irritability. Vag. Bleeding: None.  Movement: Present. Denies leaking of fluid.   The following portions of the patient's history were reviewed and updated as appropriate: allergies, current medications, past family history, past medical history, past social history, past surgical history and problem list. Problem list updated.  Objective:   Vitals:   09/02/21 0936  BP: 116/67  Pulse: 82  Weight: 239 lb (108.4 kg)    Fetal Status:     Movement: Present     General:  Alert, oriented and cooperative. Patient is in no acute distress.  Skin: Skin is warm and dry. No rash noted.   Cardiovascular: Normal heart rate noted  Respiratory: Normal respiratory effort, no problems with respiration noted  Abdomen: Soft, gravid, appropriate for gestational age. Pain/Pressure: Present     Pelvic:  Cervical exam deferred        Extremities: Normal range of motion.  Edema: Trace  Mental Status: Normal mood and affect. Normal behavior. Normal judgment and thought content.   Urinalysis:      Assessment and Plan:  Pregnancy: [id number] at [redacted]w[redacted]d  1. Supervision of other normal pregnancy, antepartum  2. Alpha thalassemia silent carrier   Preterm labor symptoms and general obstetric precautions including but not limited to vaginal bleeding, contractions, leaking of fluid and fetal movement were reviewed in detail with the patient. Please refer to After Visit Summary for other counseling recommendations.   Return in  about 1 week (around 09/09/2021) for ROB.   Brock Bad, MD  09/02/21

## 2021-09-02 NOTE — Progress Notes (Signed)
Pt reports fetal movement with some occasional contractions

## 2021-09-03 ENCOUNTER — Inpatient Hospital Stay (HOSPITAL_COMMUNITY)
Admission: AD | Admit: 2021-09-03 | Discharge: 2021-09-03 | Disposition: A | Discharge status: Home / Self Care | Payer: BC Managed Care – PPO | Attending: Obstetrics and Gynecology | Admitting: Obstetrics and Gynecology

## 2021-09-03 ENCOUNTER — Encounter (HOSPITAL_COMMUNITY): Payer: Self-pay | Admitting: Obstetrics and Gynecology

## 2021-09-03 ENCOUNTER — Other Ambulatory Visit: Payer: Self-pay

## 2021-09-03 DIAGNOSIS — O368130 Decreased fetal movements, third trimester, not applicable or unspecified: Secondary | ICD-10-CM | POA: Diagnosis not present

## 2021-09-03 DIAGNOSIS — O471 False labor at or after 37 completed weeks of gestation: Secondary | ICD-10-CM

## 2021-09-03 DIAGNOSIS — Z3A38 38 weeks gestation of pregnancy: Secondary | ICD-10-CM | POA: Diagnosis not present

## 2021-09-03 DIAGNOSIS — Z3689 Encounter for other specified antenatal screening: Secondary | ICD-10-CM

## 2021-09-03 NOTE — MAU Provider Note (Signed)
History     CSN: [id number]  Arrival date and time: 09/03/21 2143   Event Date/Time   First Provider Initiated Contact with Patient 09/03/21 2245      Chief Complaint  Patient presents with   Contractions   Decreased Fetal Movement   Wendy Gutierrez 26 y.o. [id number] at [redacted]w[redacted]d presents to MAU with complaints of contractions and decreased fetal movement. Pt states she has been having contractions for the last 2-3 weeks. "Feels like abdominal tightening." Patient states that she is more concerned with fetal movement than contractions. She states that baby has not "been moving as much as he normally does." She states that she attempted to eat to encourage movement, with very little improvement, but since presenting to MAU she states that he has been "way more active." She denies vaginal bleeding, abnormal discharge or leaking of fluid.    OB History     Gravida  4   Para  2   Term  2   Preterm  0   AB  1   Living  2      SAB  0   IAB  1   Ectopic  0   Multiple      Live Births  2           Past Medical History:  Diagnosis Date   COVID 11/2019   fever x 2 days all symptoms resolved   Headache    Vitamin D deficiency     Past Surgical History:  Procedure Laterality Date   DILATION AND EVACUATION N/A 08/21/2020   Procedure: DILATATION AND EVACUATION;  Surgeon: Sherian Rein, MD;  Location: [facility];  Service: Gynecology;  Laterality: N/A;    Family History  Problem Relation Age of Onset   Healthy Mother    Breast cancer Maternal Grandmother    Diabetes Maternal Uncle     Social History   Tobacco Use   Smoking status: Never   Smokeless tobacco: Never  Vaping Use   Vaping Use: Never used  Substance Use Topics   Alcohol use: Not Currently   Drug use: Not Currently    Allergies:  Allergies  Allergen Reactions   Amoxicillin Rash    Medications Prior to Admission  Medication Sig Dispense Refill Last Dose   ferrous  sulfate 325 (65 FE) MG tablet Take 1 tablet (325 mg total) by mouth every other day. 30 tablet 2    loratadine (CLARITIN) 10 MG tablet Take 1 tablet (10 mg total) by mouth daily. 30 tablet 11    Prenat-FePoly-Metf-FA-DHA-DSS (VITAFOL FE+) 90-1-200 & 50 MG CPPK Take 1 tablet by mouth daily. 30 each 12     Review of Systems  Constitutional:  Negative for activity change, chills, fatigue and fever.  Respiratory:  Negative for chest tightness and shortness of breath.   Cardiovascular:  Negative for chest pain and leg swelling.  Gastrointestinal:  Positive for abdominal pain. Negative for diarrhea, nausea and vomiting.  Genitourinary:  Negative for pelvic pain, vaginal bleeding, vaginal discharge and vaginal pain.  Physical Exam   Temperature 98.4 F (36.9 C), resp. rate 16, last menstrual period 11/15/2020, SpO2 100 %.  Physical Exam Vitals and nursing note reviewed.  Constitutional:      General: She is not in acute distress.    Appearance: Normal appearance.  HENT:     Head: Normocephalic.  Cardiovascular:     Rate and Rhythm: Normal rate.  Pulmonary:     Effort: Pulmonary effort  is normal. No respiratory distress.  Abdominal:     Comments: Pregnant  Musculoskeletal:        General: Normal range of motion.     Cervical back: Normal range of motion.  Skin:    General: Skin is warm and dry.  Neurological:     Mental Status: She is alert and oriented to person, place, and time.  Psychiatric:        Mood and Affect: Mood normal.    MAU Course  Procedures NST reactive and reassuring. FHT : 125bpm moderate variability with 15x15 accels. No decels present.   Dilation: 1 Station: -3 Exam by:: Zenia Resides, RN  MDM Patient reported 8 fetal kicks during 40 minute NST.  Patient feels reassured by fetal activity since being present in MAU. Discussed the option of cervical re-assessment and patient declines at this time. Through shared decision making Patient desires to return  home until active labor.   Consulted with Estanislado Spire NP on decision to discharge patient and agrees with decision.   Assessment and Plan   1. False labor after 37 completed weeks of gestation   2. [redacted] weeks gestation of pregnancy   3. Decreased fetal movements in third trimester, single or unspecified fetus   4. NST (non-stress test) reactive   - Reactive NST - Patient reassured of fetal movement.  - Next Appt on 5/30; continue with Close Follow up in the office.  - Recommended to closely follow fetal kick counts at home.  - DFM, and return labor precautions reviewed  -Patient discharged home in stable condition.   Claudette Head, CNM  09/03/2021, 10:45 PM

## 2021-09-03 NOTE — MAU Note (Signed)
AVS signature pad not working in room. Pt attempted to sign

## 2021-09-03 NOTE — Discharge Instructions (Signed)
Fetal Movement Counts Patient Name: ________________________________________________ Patient Due Date: ____________________ What is a fetal movement count?  A fetal movement count is the number of times that you feel your baby move during a certain amount of time. This may also be called a fetal kick count. A fetal movement count is recommended for every pregnant woman. You may be asked to start counting fetal movements as early as week 28 of your pregnancy. Pay attention to when your baby is most active. You may notice your baby's sleep and wake cycles. You may also notice things that make your baby move more. You should do a fetal movement count: When your baby is normally most active. At the same time each day. A good time to count movements is while you are resting, after having something to eat and drink. How do I count fetal movements? Find a quiet, comfortable area. Sit, or lie down on your side. Write down the date, the start time and stop time, and the number of movements that you felt between those two times. Take this information with you to your health care visits. Write down your start time when you feel the first movement. Count kicks, flutters, swishes, rolls, and jabs. You should feel at least 10 movements. You may stop counting after you have felt 10 movements, or if you have been counting for 2 hours. Write down the stop time. If you do not feel 10 movements in 2 hours, contact your health care provider for further instructions. Your health care provider may want to do additional tests to assess your baby's well-being. Contact a health care provider if: You feel fewer than 10 movements in 2 hours. Your baby is not moving like he or she usually does. Date: ____________ Start time: ____________ Stop time: ____________ Movements: ____________ Date: ____________ Start time: ____________ Stop time: ____________ Movements: ____________ Date: ____________ Start time: ____________ Stop  time: ____________ Movements: ____________ Date: ____________ Start time: ____________ Stop time: ____________ Movements: ____________ Date: ____________ Start time: ____________ Stop time: ____________ Movements: ____________ Date: ____________ Start time: ____________ Stop time: ____________ Movements: ____________ Date: ____________ Start time: ____________ Stop time: ____________ Movements: ____________ Date: ____________ Start time: ____________ Stop time: ____________ Movements: ____________ Date: ____________ Start time: ____________ Stop time: ____________ Movements: ____________ This information is not intended to replace advice given to you by your health care provider. Make sure you discuss any questions you have with your health care provider. Document Revised: 11/15/2018 Document Reviewed: 11/15/2018 Elsevier Patient Education  2023 [employer]vier Inc[employer]  

## 2021-09-03 NOTE — MAU Note (Signed)
Wendy Gutierrez is a 26 y.o. at [redacted]w[redacted]d here in MAU reporting: Pt reports ctx's for the last 2-3 weeks. Pt reports they have worsened today. Pt reports decreased fetal movement all day.  Denies vaginal bleeding or LOF.   Onset of complaint: 2-3 weeks ago  Pain score: 10 /10 There were no vitals filed for this visit.    Lab orders placed from triage:  none

## 2021-09-07 ENCOUNTER — Ambulatory Visit (INDEPENDENT_AMBULATORY_CARE_PROVIDER_SITE_OTHER): Payer: BC Managed Care – PPO | Admitting: Student

## 2021-09-07 VITALS — BP 124/74 | HR 93 | Wt 237.0 lb

## 2021-09-07 DIAGNOSIS — Z3A39 39 weeks gestation of pregnancy: Secondary | ICD-10-CM

## 2021-09-07 DIAGNOSIS — Z3480 Encounter for supervision of other normal pregnancy, unspecified trimester: Secondary | ICD-10-CM

## 2021-09-07 NOTE — Progress Notes (Signed)
ROB, Pt wants to be checked.

## 2021-09-07 NOTE — Progress Notes (Signed)
   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 [id number] at [redacted]w[redacted]d being seen today for ongoing prenatal care.  She is currently monitored for the following issues for this low-risk pregnancy and has Migraine without aura and without status migrainosus, not intractable; Supervision of other normal pregnancy, antepartum; Obesity in pregnancy; Alpha thalassemia silent carrier; Unwanted fertility; and Arthropathy of lumbar facet joint on their problem list.  Patient reports no complaints.  Contractions: Irritability. Vag. Bleeding: None.  Movement: Present. Denies leaking of fluid.   The following portions of the patient's history were reviewed and updated as appropriate: allergies, current medications, past family history, past medical history, past social history, past surgical history and problem list.   Objective:   Vitals:   09/07/21 1601  BP: 124/74  Pulse: 93  Weight: 237 lb (107.5 kg)    Fetal Status: Fetal Heart Rate (bpm): 134 Fundal Height: 41 cm Movement: Present     General:  Alert, oriented and cooperative. Patient is in no acute distress.  Skin: Skin is warm and dry. No rash noted.   Cardiovascular: Normal heart rate noted  Respiratory: Normal respiratory effort, no problems with respiration noted  Abdomen: Soft, gravid, appropriate for gestational age.  Pain/Pressure: Present     Pelvic: Cervical exam performed in the presence of a chaperone Dilation: 1      Extremities: Normal range of motion.  Edema: Trace  Mental Status: Normal mood and affect. Normal behavior. Normal judgment and thought content.   Assessment and Plan:  Pregnancy: [id number] at [redacted]w[redacted]d 1. Supervision of other normal pregnancy, antepartum -Doing well, no concerns, feels fetal movement  -Discussed delivery plans, Mother feels ready to deliver! Feels hopeful that baby will come around due date. Interested in [place] after due date passes.    Term labor symptoms and general obstetric  precautions including but not limited to vaginal bleeding, contractions, leaking of fluid and fetal movement were reviewed in detail with the patient. Please refer to After Visit Summary for other counseling recommendations.   Return in about 1 year (around 09/08/2022) for LOB.  Future Appointments  Date Time Provider [place]  09/15/2021  2:50 PM Shelly Bombard, MD [place] None    Johnston Ebbs, NP

## 2021-09-08 ENCOUNTER — Encounter (HOSPITAL_COMMUNITY): Payer: Self-pay

## 2021-09-08 ENCOUNTER — Encounter (HOSPITAL_COMMUNITY): Payer: Self-pay | Admitting: *Deleted

## 2021-09-08 ENCOUNTER — Telehealth (HOSPITAL_COMMUNITY): Payer: Self-pay | Admitting: *Deleted

## 2021-09-08 ENCOUNTER — Other Ambulatory Visit: Payer: Self-pay | Admitting: Advanced Practice Midwife

## 2021-09-08 DIAGNOSIS — Z3490 Encounter for supervision of normal pregnancy, unspecified, unspecified trimester: Secondary | ICD-10-CM

## 2021-09-08 NOTE — Telephone Encounter (Signed)
Preadmission screen  

## 2021-09-15 ENCOUNTER — Inpatient Hospital Stay (HOSPITAL_COMMUNITY): Payer: BC Managed Care – PPO

## 2021-09-15 ENCOUNTER — Inpatient Hospital Stay (HOSPITAL_COMMUNITY): Payer: BC Managed Care – PPO | Admitting: Anesthesiology

## 2021-09-15 ENCOUNTER — Encounter: Payer: BC Managed Care – PPO | Admitting: Obstetrics

## 2021-09-15 ENCOUNTER — Encounter (HOSPITAL_COMMUNITY): Payer: Self-pay | Admitting: Obstetrics and Gynecology

## 2021-09-15 ENCOUNTER — Inpatient Hospital Stay (HOSPITAL_COMMUNITY)
Admission: AD | Admit: 2021-09-15 | Payer: BC Managed Care – PPO | Source: Home / Self Care | Admitting: Obstetrics and Gynecology

## 2021-09-15 ENCOUNTER — Inpatient Hospital Stay (HOSPITAL_COMMUNITY)
Admission: AD | Admit: 2021-09-15 | Discharge: 2021-09-17 | DRG: 807 | Disposition: A | Discharge status: Home / Self Care | Payer: BC Managed Care – PPO | Attending: Obstetrics & Gynecology | Admitting: Obstetrics & Gynecology

## 2021-09-15 DIAGNOSIS — O480 Post-term pregnancy: Principal | ICD-10-CM | POA: Diagnosis present

## 2021-09-15 DIAGNOSIS — Z8616 Personal history of COVID-19: Secondary | ICD-10-CM

## 2021-09-15 DIAGNOSIS — Z3480 Encounter for supervision of other normal pregnancy, unspecified trimester: Secondary | ICD-10-CM

## 2021-09-15 DIAGNOSIS — O99214 Obesity complicating childbirth: Secondary | ICD-10-CM | POA: Diagnosis present

## 2021-09-15 DIAGNOSIS — D563 Thalassemia minor: Secondary | ICD-10-CM | POA: Diagnosis not present

## 2021-09-15 DIAGNOSIS — O99210 Obesity complicating pregnancy, unspecified trimester: Secondary | ICD-10-CM | POA: Diagnosis present

## 2021-09-15 DIAGNOSIS — D649 Anemia, unspecified: Secondary | ICD-10-CM | POA: Diagnosis not present

## 2021-09-15 DIAGNOSIS — Z3A40 40 weeks gestation of pregnancy: Secondary | ICD-10-CM

## 2021-09-15 DIAGNOSIS — Z3490 Encounter for supervision of normal pregnancy, unspecified, unspecified trimester: Secondary | ICD-10-CM | POA: Diagnosis present

## 2021-09-15 DIAGNOSIS — O9902 Anemia complicating childbirth: Secondary | ICD-10-CM | POA: Diagnosis not present

## 2021-09-15 LAB — CBC
HCT: 29.1 % — ABNORMAL LOW (ref 36.0–46.0)
Hemoglobin: 8.7 g/dL — ABNORMAL LOW (ref 12.0–15.0)
MCH: 21.4 pg — ABNORMAL LOW (ref 26.0–34.0)
MCHC: 29.9 g/dL — ABNORMAL LOW (ref 30.0–36.0)
MCV: 71.7 fL — ABNORMAL LOW (ref 80.0–100.0)
Platelets: 224 10*3/uL (ref 150–400)
RBC: 4.06 MIL/uL (ref 3.87–5.11)
RDW: 17.3 % — ABNORMAL HIGH (ref 11.5–15.5)
WBC: 6.5 10*3/uL (ref 4.0–10.5)
nRBC: 0 % (ref 0.0–0.2)

## 2021-09-15 LAB — TYPE AND SCREEN
ABO/RH(D): O POS
Antibody Screen: NEGATIVE

## 2021-09-15 MED ORDER — PHENYLEPHRINE 80 MCG/ML (10ML) SYRINGE FOR IV PUSH (FOR BLOOD PRESSURE SUPPORT): 80.0000 ug | PREFILLED_SYRINGE | INTRAVENOUS | Status: DC | PRN

## 2021-09-15 MED ORDER — FENTANYL-BUPIVACAINE-NACL 0.5-0.125-0.9 MG/250ML-% EP SOLN
12.0000 mL/h | EPIDURAL | Status: DC | PRN
  Administered 2021-09-15: 12 mL/h via EPIDURAL
  Filled 2021-09-15: qty 250

## 2021-09-15 MED ORDER — OXYCODONE-ACETAMINOPHEN 5-325 MG PO TABS: 2.0000 | ORAL_TABLET | ORAL | Status: DC | PRN

## 2021-09-15 MED ORDER — MISOPROSTOL 50MCG HALF TABLET
50.0000 ug | ORAL_TABLET | ORAL | Status: DC | PRN
  Administered 2021-09-15: 50 ug via BUCCAL
  Filled 2021-09-15: qty 1

## 2021-09-15 MED ORDER — OXYTOCIN-SODIUM CHLORIDE 30-0.9 UT/500ML-% IV SOLN: 1.0000 m[IU]/min | INTRAVENOUS | Status: DC

## 2021-09-15 MED ORDER — MISOPROSTOL 25 MCG QUARTER TABLET: 25.0000 ug | ORAL_TABLET | ORAL | Status: DC | PRN

## 2021-09-15 MED ORDER — TERBUTALINE SULFATE 1 MG/ML IJ SOLN: 0.2500 mg | Freq: Once | INTRAMUSCULAR | Status: DC | PRN

## 2021-09-15 MED ORDER — LACTATED RINGERS IV SOLN: INTRAVENOUS | Status: DC

## 2021-09-15 MED ORDER — DIPHENHYDRAMINE HCL 50 MG/ML IJ SOLN: 12.5000 mg | INTRAMUSCULAR | Status: DC | PRN

## 2021-09-15 MED ORDER — ONDANSETRON HCL 4 MG/2ML IJ SOLN: 4.0000 mg | Freq: Four times a day (QID) | INTRAMUSCULAR | Status: DC | PRN

## 2021-09-15 MED ORDER — ACETAMINOPHEN 325 MG PO TABS: 650.0000 mg | ORAL_TABLET | ORAL | Status: DC | PRN

## 2021-09-15 MED ORDER — LIDOCAINE HCL (PF) 1 % IJ SOLN: 30.0000 mL | INTRAMUSCULAR | Status: DC | PRN

## 2021-09-15 MED ORDER — LIDOCAINE HCL (PF) 1 % IJ SOLN
INTRAMUSCULAR | Status: DC | PRN
  Administered 2021-09-15: 6 mL via EPIDURAL
  Administered 2021-09-15: 4 mL via EPIDURAL

## 2021-09-15 MED ORDER — OXYTOCIN-SODIUM CHLORIDE 30-0.9 UT/500ML-% IV SOLN
2.5000 [IU]/h | INTRAVENOUS | Status: DC
  Filled 2021-09-15: qty 500

## 2021-09-15 MED ORDER — SOD CITRATE-CITRIC ACID 500-334 MG/5ML PO SOLN: 30.0000 mL | ORAL | Status: DC | PRN

## 2021-09-15 MED ORDER — EPHEDRINE 5 MG/ML INJ: 10.0000 mg | INTRAVENOUS | Status: DC | PRN

## 2021-09-15 MED ORDER — OXYCODONE-ACETAMINOPHEN 5-325 MG PO TABS: 1.0000 | ORAL_TABLET | ORAL | Status: DC | PRN

## 2021-09-15 MED ORDER — FENTANYL CITRATE (PF) 100 MCG/2ML IJ SOLN: 100.0000 ug | INTRAMUSCULAR | Status: DC | PRN

## 2021-09-15 MED ORDER — LACTATED RINGERS IV SOLN: 500.0000 mL | INTRAVENOUS | Status: DC | PRN

## 2021-09-15 MED ORDER — OXYTOCIN BOLUS FROM INFUSION
333.0000 mL | Freq: Once | INTRAVENOUS | Status: AC
  Administered 2021-09-16: 333 mL via INTRAVENOUS

## 2021-09-15 MED ORDER — LACTATED RINGERS IV SOLN: 500.0000 mL | Freq: Once | INTRAVENOUS | Status: DC

## 2021-09-15 NOTE — H&P (Signed)
OBSTETRIC ADMISSION HISTORY AND PHYSICAL  Wendy Gutierrez is a 26 y.o. female [phone] with IUP at [redacted]w[redacted]d by 6 week [place] presenting for eIOL. She reports +FMs, no LOF, no VB, no blurry vision, headaches, peripheral edema, or RUQ pain.  She plans on formula feeding. She is considering IUD placement for birth control postpartum.  She received her prenatal care at [facility].   Dating: By [place] --->  Estimated Date of Delivery: 09/13/21  Sono:   @[redacted]w[redacted]d , CWD, normal anatomy, cephalic presentation, anterior placental lie, 1797 g, 43% EFW  Prenatal History/Complications:  Maternal obesity (BMI 37) Alpha thalassemia carrier   Past Medical History: Past Medical History:  Diagnosis Date   COVID 11/2019   fever x 2 days all symptoms resolved   Headache    Vitamin D deficiency     Past Surgical History: Past Surgical History:  Procedure Laterality Date   DILATION AND EVACUATION N/A 08/21/2020   Procedure: DILATATION AND EVACUATION;  Surgeon: Janyth Contes, MD;  Location: [facility];  Service: Gynecology;  Laterality: N/A;    Obstetrical History: OB History     Gravida  4   Para  2   Term  2   Preterm  0   AB  1   Living  2      SAB  0   IAB  1   Ectopic  0   Multiple      Live Births  2           Social History Social History   Socioeconomic History   Marital status: Married    Spouse name: Not on file   Number of children: 0   Years of education: 13   Highest education level: Not on file  Occupational History   Occupation: Bakery   Occupation: Hostess  Tobacco Use   Smoking status: Never   Smokeless tobacco: Never  Scientific laboratory technician Use: Never used  Substance and Sexual Activity   Alcohol use: Not Currently   Drug use: Not Currently   Sexual activity: Yes    Partners: Male    Birth control/protection: None  Other Topics Concern   Not on file  Social History Narrative   ** Merged History Encounter **       Lives at  home with mother. Right-handed. 2-3 cups caffeine per day.   Social Determinants of Health   Financial Resource Strain: Not on file  Food Insecurity: Not on file  Transportation Needs: Not on file  Physical Activity: Not on file  Stress: Not on file  Social Connections: Not on file    Family History: Family History  Problem Relation Age of Onset   Healthy Mother    Breast cancer Maternal Grandmother    Diabetes Maternal Uncle     Allergies: Allergies  Allergen Reactions   Amoxicillin Rash    Medications Prior to Admission  Medication Sig Dispense Refill Last Dose   ferrous sulfate 325 (65 FE) MG tablet Take 1 tablet (325 mg total) by mouth every other day. 30 tablet 2    loratadine (CLARITIN) 10 MG tablet Take 1 tablet (10 mg total) by mouth daily. 30 tablet 11    Prenat-FePoly-Metf-FA-DHA-DSS (VITAFOL FE+) 90-1-200 & 50 MG CPPK Take 1 tablet by mouth daily. 30 each 12      Review of Systems  All systems reviewed and negative except as stated in HPI  Blood pressure 125/63, pulse 85, temperature 98.9 F (37.2 C),  temperature source Oral, resp. rate 16, height 5\' 7"  (1.702 m), weight 110 kg, last menstrual period 11/15/2020.  General appearance: alert, cooperative, and no distress Lungs: normal work of breathing on room air  Heart: normal rate, warm and well perfused  Abdomen: soft, non-tender, gravid  Extremities: no LE edema or calf tenderness to palpation   Presentation: Cephalic per RN Fetal monitoring: Baseline 120 bpm, moderate variability, + accels, no decels Uterine activity: Uterine irritability  Dilation: 1 Effacement (%): 50 Station: -3 Exam by:: Mingo Amber, RN  Prenatal labs: ABO, Rh: --/--/O POS (06/07 1315) Antibody: NEG (06/07 1315) Rubella: 1.72 (11/17 1118) RPR: Non Reactive (03/09 1054)  HBsAg: Negative (11/17 1118)  HIV: Non Reactive (03/09 1054)  GBS: Negative/-- (05/11 1440)  2 hr Glucola normal  Genetic screening - LR NIPS, alpha  thalassemia carrier  Anatomy US normal   Prenatal Transfer Tool  Maternal Diabetes: No Genetic Screening: As above  Maternal Ultrasounds/Referrals: Normal Fetal Ultrasounds or other Referrals:  None Maternal Substance Abuse:  No Significant Maternal Medications:  None Significant Maternal Lab Results: Group B Strep negative  Results for orders placed or performed during the hospital encounter of 09/15/21 (from the past 24 hour(s))  Type and screen   Collection Time: 09/15/21  1:15 PM  Result Value Ref Range   ABO/RH(D) O POS    Antibody Screen NEG    Sample Expiration      09/18/2021,2359 Performed at [facility], [place] [address], [place], [place] [address]   CBC   Collection Time: 09/15/21  1:17 PM  Result Value Ref Range   WBC 6.5 4.0 - 10.5 K/uL   RBC 4.06 3.87 - 5.11 MIL/uL   Hemoglobin 8.7 (L) 12.0 - 15.0 g/dL   HCT 29.1 (L) 36.0 - 46.0 %   MCV 71.7 (L) 80.0 - 100.0 fL   MCH 21.4 (L) 26.0 - 34.0 pg   MCHC 29.9 (L) 30.0 - 36.0 g/dL   RDW 17.3 (H) 11.5 - 15.5 %   Platelets 224 150 - 400 K/uL   nRBC 0.0 0.0 - 0.2 %    Patient Active Problem List   Diagnosis Date Noted   Encounter for elective induction of labor 09/15/2021   Unwanted fertility 06/04/2021   Alpha thalassemia silent carrier 03/24/2021   Obesity in pregnancy 02/25/2021   Supervision of other normal pregnancy, antepartum 01/19/2021   Arthropathy of lumbar facet joint 07/09/2019   Migraine without aura and without status migrainosus, not intractable 07/24/2014    Assessment/Plan:  Wendy Gutierrez is a 26 y.o. [id number] at [redacted]w[redacted]d here for eIOL.   #Labor: Will start induction with 50 mcg buccal Cytotec and reassess in 4 hours.  #Pain: PRN; planning for epidural  #FWB: Cat 1  #ID:  GBS neg #MOF: Formula #MOC: Planning for IUD. Unsure if she would like post-placental or outpatient placement. Counseling provided. Will reassess.  #Circ:  Unsure; however, is leaning towards doing this inpatient. Will  reassess postpartum.   Genia Del, MD  09/15/2021, 2:19 PM

## 2021-09-15 NOTE — Anesthesia Preprocedure Evaluation (Signed)
Anesthesia Evaluation  Patient identified by MRN, date of birth, ID band Patient awake    Reviewed: Allergy & Precautions, H&P , NPO status , Patient's Chart, lab work & pertinent test results  History of Anesthesia Complications Negative for: history of anesthetic complications  Airway Mallampati: II  TM Distance: >3 FB     Dental   Pulmonary neg pulmonary ROS,    Pulmonary exam normal        Cardiovascular negative cardio ROS   Rhythm:regular Rate:Normal     Neuro/Psych  Headaches, negative psych ROS   GI/Hepatic negative GI ROS, Neg liver ROS,   Endo/Other  negative endocrine ROS  Renal/GU negative Renal ROS  negative genitourinary   Musculoskeletal   Abdominal   Peds  Hematology  (+) Blood dyscrasia, anemia ,   Anesthesia Other Findings   Reproductive/Obstetrics (+) Pregnancy                             Anesthesia Physical Anesthesia Plan  ASA: 2  Anesthesia Plan: Epidural   Post-op Pain Management:    Induction:   PONV Risk Score and Plan:   Airway Management Planned:   Additional Equipment:   Intra-op Plan:   Post-operative Plan:   Informed Consent: I have reviewed the patients History and Physical, chart, labs and discussed the procedure including the risks, benefits and alternatives for the proposed anesthesia with the patient or authorized representative who has indicated his/her understanding and acceptance.       Plan Discussed with:   Anesthesia Plan Comments:         Anesthesia Quick Evaluation

## 2021-09-15 NOTE — Progress Notes (Signed)
Labor Progress Note Wendy Gutierrez is a 26 y.o. [id number] at [redacted]w[redacted]d who presented for eIOL.   S: Doing well. Pain controlled after epidural. No concerns.   O:  BP 126/65 (BP Location: Left Arm)   Pulse 84   Temp 98.8 F (37.1 C) (Oral)   Resp 20   Ht 5\' 7"  (1.702 m)   Wt 110 kg   LMP 11/15/2020   SpO2 100%   BMI 37.98 kg/m   EFM: Baseline 110 bpm, moderate variability, + accels, no decels  Toco: Irregular, every 1-5 minutes   CVE: Dilation: 4 Effacement (%): 80 Cervical Position: Middle Station: -2 Presentation: Vertex Exam by:: [email or web address] RNC  A&P: 26 y.o. 26 [redacted]w[redacted]d   #Labor: Progressing well. Plan for AROM on next exam.  #Pain: Epidural  #FWB: Cat 1  #GBS negative  [redacted]w[redacted]d, MD 7:21 PM;

## 2021-09-15 NOTE — Progress Notes (Signed)
Labor Progress Note Wendy Gutierrez is a 26 y.o. [id number] at [redacted]w[redacted]d presented for eIOL.   S: doing well- had some right sided hip pain earlier that improved with epidural button.   O:  BP 122/72   Pulse 79   Temp 98.8 F (37.1 C) (Oral)   Resp 20   Ht 5\' 7"  (1.702 m)   Wt 110 kg   LMP 11/15/2020   SpO2 100%   BMI 37.98 kg/m  EFM: 115/mod/15x15/none  CVE: Dilation: 4 Effacement (%): 80 Cervical Position: Middle Station: -2 Presentation: Vertex Exam by:: Dr.Orvella Digiulio   A&P: 26 y.o. 26 [redacted]w[redacted]d  #Labor: Largely unchanged since last check but head well applied. After verbal consent, performed AROM with moderate amount of clear fluid. Head remained well applied. If unchanged on the next check, will consider pit.  #Pain: Epidural in place.  #FWB: Cat I #GBS negative  [redacted]w[redacted]d, DO 10:26 PM

## 2021-09-15 NOTE — Anesthesia Procedure Notes (Addendum)
Epidural Patient location during procedure: OB Start time: 09/15/2021 5:40 PM End time: 09/15/2021 5:52 PM  Staffing Anesthesiologist: Lucretia Kern, MD Performed: anesthesiologist   Preanesthetic Checklist Completed: patient identified, IV checked, risks and benefits discussed, monitors and equipment checked, pre-op evaluation and timeout performed  Epidural Patient position: sitting Prep: DuraPrep Patient monitoring: heart rate, continuous pulse ox and blood pressure Approach: midline Location: L3-L4 Injection technique: LOR air  Needle:  Needle type: Tuohy  Needle gauge: 17 G Needle length: 9 cm Needle insertion depth: 8 cm Catheter type: closed end flexible Catheter size: 19 Gauge Catheter at skin depth: 13 cm Test dose: negative  Assessment Events: blood not aspirated, injection not painful, no injection resistance, no paresthesia and negative IV test  Additional Notes Reason for block:procedure for pain

## 2021-09-15 NOTE — Plan of Care (Signed)

## 2021-09-16 ENCOUNTER — Encounter (HOSPITAL_COMMUNITY): Payer: Self-pay | Admitting: Obstetrics and Gynecology

## 2021-09-16 DIAGNOSIS — O480 Post-term pregnancy: Secondary | ICD-10-CM

## 2021-09-16 DIAGNOSIS — Z3A40 40 weeks gestation of pregnancy: Secondary | ICD-10-CM

## 2021-09-16 LAB — CBC
HCT: 26.7 % — ABNORMAL LOW (ref 36.0–46.0)
Hemoglobin: 8.1 g/dL — ABNORMAL LOW (ref 12.0–15.0)
MCH: 21.4 pg — ABNORMAL LOW (ref 26.0–34.0)
MCHC: 30.3 g/dL (ref 30.0–36.0)
MCV: 70.6 fL — ABNORMAL LOW (ref 80.0–100.0)
Platelets: 208 10*3/uL (ref 150–400)
RBC: 3.78 MIL/uL — ABNORMAL LOW (ref 3.87–5.11)
RDW: 17.4 % — ABNORMAL HIGH (ref 11.5–15.5)
WBC: 8.7 10*3/uL (ref 4.0–10.5)
nRBC: 0 % (ref 0.0–0.2)

## 2021-09-16 LAB — RPR: RPR Ser Ql: NONREACTIVE

## 2021-09-16 MED ORDER — ZOLPIDEM TARTRATE 5 MG PO TABS: 5.0000 mg | ORAL_TABLET | Freq: Every evening | ORAL | Status: DC | PRN

## 2021-09-16 MED ORDER — DIBUCAINE (PERIANAL) 1 % EX OINT
1.0000 "application " | TOPICAL_OINTMENT | CUTANEOUS | Status: DC | PRN
  Administered 2021-09-17: 1 via RECTAL
  Filled 2021-09-16 (×2): qty 28

## 2021-09-16 MED ORDER — BENZOCAINE-MENTHOL 20-0.5 % EX AERO
1.0000 "application " | INHALATION_SPRAY | CUTANEOUS | Status: DC | PRN
  Administered 2021-09-16: 1 via TOPICAL
  Filled 2021-09-16: qty 56

## 2021-09-16 MED ORDER — TETANUS-DIPHTH-ACELL PERTUSSIS 5-2.5-18.5 LF-MCG/0.5 IM SUSY: 0.5000 mL | PREFILLED_SYRINGE | Freq: Once | INTRAMUSCULAR | Status: DC

## 2021-09-16 MED ORDER — COCONUT OIL OIL: 1.0000 "application " | TOPICAL_OIL | Status: DC | PRN

## 2021-09-16 MED ORDER — MEASLES, MUMPS & RUBELLA VAC IJ SOLR: 0.5000 mL | Freq: Once | INTRAMUSCULAR | Status: DC

## 2021-09-16 MED ORDER — PRENATAL MULTIVITAMIN CH
1.0000 | ORAL_TABLET | Freq: Every day | ORAL | Status: DC
  Administered 2021-09-16 – 2021-09-17 (×2): 1 via ORAL
  Filled 2021-09-16 (×2): qty 1

## 2021-09-16 MED ORDER — IBUPROFEN 600 MG PO TABS
600.0000 mg | ORAL_TABLET | Freq: Four times a day (QID) | ORAL | Status: DC
  Administered 2021-09-16 – 2021-09-17 (×6): 600 mg via ORAL
  Filled 2021-09-16 (×6): qty 1

## 2021-09-16 MED ORDER — OXYCODONE HCL 5 MG PO TABS: 5.0000 mg | ORAL_TABLET | ORAL | Status: DC | PRN

## 2021-09-16 MED ORDER — ACETAMINOPHEN 325 MG PO TABS: 650.0000 mg | ORAL_TABLET | ORAL | Status: DC | PRN

## 2021-09-16 MED ORDER — SENNOSIDES-DOCUSATE SODIUM 8.6-50 MG PO TABS
2.0000 | ORAL_TABLET | ORAL | Status: DC
  Administered 2021-09-16 – 2021-09-17 (×2): 2 via ORAL
  Filled 2021-09-16 (×2): qty 2

## 2021-09-16 MED ORDER — DIPHENHYDRAMINE HCL 25 MG PO CAPS: 25.0000 mg | ORAL_CAPSULE | Freq: Four times a day (QID) | ORAL | Status: DC | PRN

## 2021-09-16 MED ORDER — FERROUS SULFATE 325 (65 FE) MG PO TABS
325.0000 mg | ORAL_TABLET | ORAL | Status: DC
  Administered 2021-09-16: 325 mg via ORAL
  Filled 2021-09-16: qty 1

## 2021-09-16 MED ORDER — ONDANSETRON HCL 4 MG PO TABS: 4.0000 mg | ORAL_TABLET | ORAL | Status: DC | PRN

## 2021-09-16 MED ORDER — SIMETHICONE 80 MG PO CHEW: 80.0000 mg | CHEWABLE_TABLET | ORAL | Status: DC | PRN

## 2021-09-16 MED ORDER — ONDANSETRON HCL 4 MG/2ML IJ SOLN: 4.0000 mg | INTRAMUSCULAR | Status: DC | PRN

## 2021-09-16 MED ORDER — WITCH HAZEL-GLYCERIN EX PADS: 1.0000 "application " | MEDICATED_PAD | CUTANEOUS | Status: DC | PRN

## 2021-09-16 NOTE — Discharge Summary (Signed)
Postpartum Discharge Summary      Patient Name: Wendy Gutierrez DOB: 12-12-1995 MRN: [id number]  Date of admission: 09/15/2021 Delivery date:09/16/2021  Delivering provider: Serita Grammes D  Date of discharge: 09/17/2021  Admitting diagnosis: Encounter for elective induction of labor [Z34.90] Intrauterine pregnancy: [redacted]w[redacted]d    Secondary diagnosis:  Principal Problem:   Encounter for elective induction of labor Active Problems:   Supervision of other normal pregnancy, antepartum   Obesity in pregnancy   Alpha thalassemia silent carrier  Additional problems: anemia of pregnancy   Discharge diagnosis: Term Pregnancy Delivered                                              Post partum procedures: none Augmentation: AROM and Cytotec Complications: None  Hospital course: Induction of Labor With Vaginal Delivery   26y.o. yo [id number] admitted to the hospital 09/15/2021 for induction of labor.  Indication for induction: Elective.  Patient had an uncomplicated labor course as follows: Membrane Rupture Time/Date: 10:08 PM ,09/15/2021   Delivery Method:Vaginal, Spontaneous  Episiotomy: None  Lacerations:  None  Details of delivery can be found in separate delivery note.  Patient had a routine postpartum course. Patient is discharged home 09/17/21.  Newborn Data: Birth date:09/16/2021  Birth time:12:09 AM  Gender:Female  Living status:Living  Apgars:8 ,8  Weight:3810 g (8lb 6.4oz)  Magnesium Sulfate received: No BMZ received: No Rhophylac:N/A MMR:N/A T-DaP: offered postpartum Flu: No Transfusion:No  Physical exam  Vitals:   09/16/21 1638 09/16/21 2007 09/17/21 0511 09/17/21 0700  BP: 125/76 (!) 103/55 124/87 106/64  Pulse: 72 79 65 68  Resp: _0 Temp: 99 F (37.2 C) 98.5 F (36.9 C) 98 F (36.7 C) 98.1 F (36.7 C)  TempSrc: Oral Oral Oral Oral  SpO2: 100% 100% 100%   Weight:      Height:       General: alert, cooperative, and no distress Lochia:  appropriate Uterine Fundus: firm Incision: N/A DVT Evaluation: No evidence of DVT seen on physical exam. Negative Homan's sign. No cords or calf tenderness. No significant calf/ankle edema. Labs: Lab Results  Component Value Date   WBC 8.7 09/16/2021   HGB 8.1 (L) 09/16/2021   HCT 26.7 (L) 09/16/2021   MCV 70.6 (L) 09/16/2021   PLT 208 09/16/2021      Latest Ref Rng & Units 04/02/2021    8:45 AM  CMP  Glucose 70 - 99 mg/dL 96   BUN 6 - 20 mg/dL 7   Creatinine 0.44 - 1.00 mg/dL 0.64   Sodium 135 - 145 mmol/L 134   Potassium 3.5 - 5.1 mmol/L 3.7   Chloride 98 - 111 mmol/L 106   CO2 22 - 32 mmol/L 20   Calcium 8.9 - 10.3 mg/dL 8.8   Total Protein 6.5 - 8.1 g/dL 6.4   Total Bilirubin 0.3 - 1.2 mg/dL 0.2   Alkaline Phos 38 - 126 U/L 45   AST 15 - 41 U/L 18   ALT 0 - 44 U/L 21    Edinburgh Score:    09/16/2021    2:10 AM  Edinburgh Postnatal Depression Scale Screening Tool  I have been able to laugh and see the funny side of things. 0  I have looked forward with enjoyment to things. 0  I have blamed myself  unnecessarily when things went wrong. 0  I have been anxious or worried for no good reason. 0  I have felt scared or panicky for no good reason. 0  Things have been getting on top of me. 0  I have been so unhappy that I have had difficulty sleeping. 0  I have felt sad or miserable. 0  I have been so unhappy that I have been crying. 0  The thought of harming myself has occurred to me. 0  Edinburgh Postnatal Depression Scale Total 0     After visit meds:  Allergies as of 09/17/2021       Reactions   Amoxicillin Rash        Medication List     STOP taking these medications    loratadine 10 MG tablet Commonly known as: Claritin       TAKE these medications    acetaminophen 325 MG tablet Commonly known as: Tylenol Take 2 tablets (650 mg total) by mouth every 4 (four) hours as needed (for pain scale < 4).   ferrous sulfate 325 (65 FE) MG tablet Take 1  tablet (325 mg total) by mouth every other day.   ibuprofen 600 MG tablet Commonly known as: ADVIL Take 1 tablet (600 mg total) by mouth every 6 (six) hours.   Vitafol FE+ 90-1-200 & 50 MG Cppk Take 1 tablet by mouth daily.         Discharge home in stable condition Infant Feeding: Breast Infant Disposition:home with mother Discharge instruction: per After Visit Summary and Postpartum booklet. Activity: Advance as tolerated. Pelvic rest for 6 weeks.  Diet: routine diet Future Appointments: Future Appointments  Date Time Provider [place]  10/28/2021 10:15 AM Johnston Ebbs, NP [place] None   Follow up Visit:  Myrtis Ser, CNM  P [facility] Please schedule this patient for Postpartum visit in: 6 weeks with the following provider: Any provider  In-Person  For C/S patients schedule nurse incision check in weeks 2 weeks: no  Low risk pregnancy complicated by: none  Delivery mode:  SVD  Anticipated Birth Control:  IUD  PP Procedures needed: IUD placement  Schedule Integrated [place] visit: no   09/17/2021 Christin Fudge, CNM

## 2021-09-16 NOTE — Plan of Care (Signed)
  Problem: Education: Goal: Knowledge of Childbirth will improve 09/16/2021 0046 by Thera Flake, RN Outcome: Completed/Met 09/15/2021 1920 by Thera Flake, RN Outcome: Progressing Goal: Ability to make informed decisions regarding treatment and plan of care will improve 09/16/2021 0046 by Thera Flake, RN Outcome: Completed/Met 09/15/2021 1920 by Thera Flake, RN Outcome: Progressing Goal: Ability to state and carry out methods to decrease the pain will improve 09/16/2021 0046 by Thera Flake, RN Outcome: Completed/Met 09/15/2021 1920 by Thera Flake, RN Outcome: Progressing Goal: Individualized Educational Video(s) 09/16/2021 0046 by Thera Flake, RN Outcome: Completed/Met 09/15/2021 1920 by Thera Flake, RN Outcome: Progressing   Problem: Coping: Goal: Ability to verbalize concerns and feelings about labor and delivery will improve 09/16/2021 0046 by Thera Flake, RN Outcome: Completed/Met 09/15/2021 1920 by Thera Flake, RN Outcome: Progressing   Problem: Life Cycle: Goal: Ability to make normal progression through stages of labor will improve 09/16/2021 0046 by Thera Flake, RN Outcome: Completed/Met 09/15/2021 1920 by Thera Flake, RN Outcome: Progressing Goal: Ability to effectively push during vaginal delivery will improve 09/16/2021 0046 by Thera Flake, RN Outcome: Completed/Met 09/15/2021 1920 by Thera Flake, RN Outcome: Progressing   Problem: Role Relationship: Goal: Will demonstrate positive interactions with the child 09/16/2021 0046 by Thera Flake, RN Outcome: Completed/Met 09/15/2021 1920 by Thera Flake, RN Outcome: Progressing   Problem: Safety: Goal: Risk of complications during labor and delivery will decrease 09/16/2021 0046 by Thera Flake, RN Outcome: Completed/Met 09/15/2021 1920 by Thera Flake, RN Outcome: Progressing   Problem: Pain Management: Goal: Relief or control of pain from uterine  contractions will improve 09/16/2021 0046 by Thera Flake, RN Outcome: Completed/Met 09/15/2021 1920 by Thera Flake, RN Outcome: Progressing

## 2021-09-16 NOTE — Anesthesia Postprocedure Evaluation (Signed)
Anesthesia Post Note  Patient: Tanique Matney  Procedure(s) Performed: AN AD HOC LABOR EPIDURAL     Patient location during evaluation: Mother Baby Anesthesia Type: Epidural Level of consciousness: awake, awake and alert and oriented Pain management: pain level controlled Vital Signs Assessment: post-procedure vital signs reviewed and stable Respiratory status: spontaneous breathing, nonlabored ventilation and respiratory function stable Cardiovascular status: stable Postop Assessment: no headache, patient able to bend at knees, no apparent nausea or vomiting, adequate PO intake and able to ambulate Anesthetic complications: no   No notable events documented.  Last Vitals:  Vitals:   09/16/21 0312 09/16/21 0730  BP: 125/74 118/74  Pulse: 78 71  Resp: 16 18  Temp: 37.4 C 37.1 C  SpO2: 100% 100%    Last Pain:  Vitals:   09/16/21 0730  TempSrc: Oral  PainSc:    Pain Goal: Patients Stated Pain Goal: 6 (09/15/21 2130)                 Lillymae Duet

## 2021-09-17 MED ORDER — IBUPROFEN 600 MG PO TABS: 600.0000 mg | ORAL_TABLET | Freq: Four times a day (QID) | ORAL | 0 refills | Status: DC

## 2021-09-17 MED ORDER — ACETAMINOPHEN 325 MG PO TABS: 650.0000 mg | ORAL_TABLET | ORAL | Status: DC | PRN

## 2021-09-17 NOTE — Progress Notes (Signed)
Nursing student and Instructor in the room stated they will take over care for this patient. Told to call for any concerns or questions.

## 2021-09-17 NOTE — Progress Notes (Signed)
Circumcision Consent  Discussed with mom at bedside about circumcision.   Circumcision is a surgery that removes the skin that covers the tip of the penis, called the "foreskin." Circumcision is usually done when a boy is between 1 and 10 days old, sometimes up to 3-4 weeks old.  The most common reasons boys are circumcised include for cultural/religious beliefs or for parental preference (potentially easier to clean, so baby looks like daddy, etc).  There may be some medical benefits for circumcision:   Circumcised boys seem to have slightly lower rates of: ? Urinary tract infections (per the American Academy of Pediatrics an uncircumcised boy has a 1/100 chance of developing a UTI in the first year of life, a circumcised boy at a 04/998 chance of developing a UTI in the first year of life- a 10% reduction) ? Penis cancer (typically rare- an uncircumcised female has a 1 in 100,000 chance of developing cancer of the penis) ? Sexually transmitted infection (in endemic areas, including HIV, HPV and Herpes- circumcision does NOT protect against gonorrhea, chlamydia, trachomatis, or syphilis) ? Phimosis: a condition where that makes retraction of the foreskin over the glans impossible (0.4 per 1000 boys per year or 0.6% of boys are affected by their 15th birthday)  Boys and men who are not circumcised can reduce these extra risks by: ? Cleaning their penis well ? Using condoms during sex  What are the risks of circumcision?  As with any surgical procedure, there are risks and complications. In circumcision, complications are rare and usually minor, the most common being: ? Bleeding- risk is reduced by holding each clamp for 30 seconds prior to a cut being made, and by holding pressure after the procedure is done ? Infection- the penis is cleaned prior to the procedure, and the procedure is done under sterile technique ? Damage to the urethra or amputation of the penis  How is circumcision done  in baby boys?  The baby will be placed on a special table and the legs restrained for their safety. Numbing medication is injected into the penis, and the skin is cleansed with betadine to decrease the risk of infection.   What to expect:  The penis will look red and raw for 5-7 days as it heals. We expect scabbing around where the cut was made, as well as clear-pink fluid and some swelling of the penis right after the procedure. If your baby's circumcision starts to bleed or develops pus, please contact your pediatrician immediately.  All questions were answered and mother consented.  Nadeen Shipman [facility] Fellow  

## 2021-09-25 ENCOUNTER — Telehealth (HOSPITAL_COMMUNITY): Payer: Self-pay

## 2021-09-25 NOTE — Telephone Encounter (Addendum)
"  I'm doing pretty good. Everything is going pretty good." Patient has not questions or concerns about her healing.   "He's good, eating good, having good diapers. He sleeps in a crib." RN reviewed ABC's of safe sleep with patient. Patient declines any questions or concerns about baby.  EPDS score is 0.  Wendy Duster [facility] 06/17//2023,1234

## 2021-10-28 ENCOUNTER — Ambulatory Visit: Payer: BC Managed Care – PPO | Admitting: Student

## 2021-11-02 ENCOUNTER — Telehealth (INDEPENDENT_AMBULATORY_CARE_PROVIDER_SITE_OTHER): Payer: BC Managed Care – PPO | Admitting: Family Medicine

## 2021-11-02 NOTE — Progress Notes (Signed)
Provider location: [facility] for [facility]   Patient location: Home  I connected withNAME@ on 11/02/21 at  3:50 PM EDT by Mychart Video Encounter and verified that I am speaking with the correct person using two identifiers.       I discussed the limitations, risks, security and privacy concerns of performing an evaluation and management service virtually and the availability of in person appointments. I also discussed with the patient that there may be a patient responsible charge related to this service. The patient expressed understanding and agreed to proceed.  Post Partum Visit Note Subjective:   Wendy Gutierrez is a 27 y.o. [phone] female who presents for a postpartum visit. She is 7 weeks postpartum following a normal spontaneous vaginal delivery.  I have fully reviewed the prenatal and intrapartum course. The delivery was at 40 gestational weeks.  Anesthesia: epidural. Postpartum course has been uncomplicated. Baby is doing well. Baby is feeding by bottle - Lucien [employer] Start Soothe Pro . Bleeding staining only. Bowel function is normal. Bladder function is normal. Patient is not sexually active. Contraception method is none. Postpartum depression screening: negative.   The pregnancy intention screening data noted above was reviewed. Potential methods of contraception were discussed. The patient elected to proceed with No data recorded.   Edinburgh Postnatal Depression Scale - 11/02/21 1455       Edinburgh Postnatal Depression Scale:  In the Past 7 Days   I have been able to laugh and see the funny side of things. 0    I have looked forward with enjoyment to things. 0    I have blamed myself unnecessarily when things went wrong. 0    I have been anxious or worried for no good reason. 0    I have felt scared or panicky for no good reason. 0    Things have been getting on top of me. 0    I have been so unhappy that I have had difficulty sleeping. 0    I have felt  sad or miserable. 0    I have been so unhappy that I have been crying. 0    The thought of harming myself has occurred to me. 0    Edinburgh Postnatal Depression Scale Total 0             The following portions of the patient's history were reviewed and updated as appropriate: allergies, current medications, past family history, past medical history, past social history, past surgical history, and problem list.  Review of Systems Pertinent items are noted in HPI.  Objective:  LMP 10/26/2021   Breastfeeding No     General:  Alert, oriented and cooperative. Patient is in no acute distress.  Respiratory: Normal respiratory effort, no problems with respiration noted  Mental Status: Normal mood and affect. Normal behavior. Normal judgment and thought content.  Rest of physical exam deferred due to type of encounter   Assessment:   1. Postpartum exam     Plan:  Essential components of care per ACOG recommendations:  1.  Mood and well being: Patient with negative depression screening today. Reviewed local resources for support.  - Patient does not use tobacco.  - hx of drug use? No    2. Infant care and feeding:  -Patient currently breastmilk feeding? No  -Social determinants of health (SDOH) reviewed in EPIC. No concerns  3. Sexuality, contraception and birth spacing - Patient does not want a pregnancy in the next year.   -  Reviewed forms of contraception in tiered fashion. Patient desired IUD today.   - Discussed birth spacing of 18 months  4. Sleep and fatigue -Encouraged family/partner/community support of 4 hrs of uninterrupted sleep to help with mood and fatigue  5. Physical Recovery  - Discussed patients delivery and complications - Patient has urinary incontinence? No - Patient is safe to resume physical and sexual activity  6.  Health Maintenance - Last pap smear done 2022 and was normal with negative HPV.   7. No Chronic Disease - PCP follow up  I  provided 15 minutes of face-to-face time during this encounter.    No follow-ups on file.  Future Appointments  Date Time Provider Department Center  11/02/2021  3:50 PM Levie Heritage, DO [facility] None    Levie Heritage, DO Center for [employer], [facility]

## 2022-03-30 ENCOUNTER — Ambulatory Visit (INDEPENDENT_AMBULATORY_CARE_PROVIDER_SITE_OTHER): Payer: BC Managed Care – PPO | Admitting: *Deleted

## 2022-03-30 VITALS — BP 119/69 | HR 73

## 2022-03-30 DIAGNOSIS — Z3201 Encounter for pregnancy test, result positive: Secondary | ICD-10-CM

## 2022-03-30 LAB — POCT URINE PREGNANCY: Preg Test, Ur: POSITIVE — AB

## 2022-03-30 NOTE — Progress Notes (Signed)
Wendy Gutierrez presents today for UPT. She has no unusual complaints. LMP: 02/21/22    OBJECTIVE: Appears well, in no apparent distress.  OB History     Gravida  4   Para  3   Term  3   Preterm  0   AB  1   Living  3      SAB  0   IAB  1   Ectopic  0   Multiple  0   Live Births  3          Home UPT Result: positive In-Office UPT result: positive I have reviewed the patient's medical, obstetrical, social, and family histories, and medications.   ASSESSMENT: Positive pregnancy test  PLAN Pt is uncertain if she will continue the pregnancy. Pt will call the office to schedule OB US/ intake if she decides to continue the pregnancy.

## 2022-04-16 ENCOUNTER — Inpatient Hospital Stay (HOSPITAL_COMMUNITY)
Admission: AD | Admit: 2022-04-16 | Discharge: 2022-04-16 | Disposition: A | Discharge status: Home / Self Care | Payer: BC Managed Care – PPO | Attending: Obstetrics and Gynecology | Admitting: Obstetrics and Gynecology

## 2022-04-16 ENCOUNTER — Other Ambulatory Visit: Payer: Self-pay

## 2022-04-16 ENCOUNTER — Inpatient Hospital Stay (HOSPITAL_COMMUNITY): Payer: BC Managed Care – PPO

## 2022-04-16 DIAGNOSIS — O26891 Other specified pregnancy related conditions, first trimester: Secondary | ICD-10-CM | POA: Diagnosis not present

## 2022-04-16 DIAGNOSIS — Z3491 Encounter for supervision of normal pregnancy, unspecified, first trimester: Secondary | ICD-10-CM

## 2022-04-16 DIAGNOSIS — M25519 Pain in unspecified shoulder: Secondary | ICD-10-CM | POA: Insufficient documentation

## 2022-04-16 DIAGNOSIS — R42 Dizziness and giddiness: Secondary | ICD-10-CM | POA: Insufficient documentation

## 2022-04-16 DIAGNOSIS — R109 Unspecified abdominal pain: Secondary | ICD-10-CM | POA: Insufficient documentation

## 2022-04-16 DIAGNOSIS — Z3A01 Less than 8 weeks gestation of pregnancy: Secondary | ICD-10-CM | POA: Insufficient documentation

## 2022-04-16 DIAGNOSIS — Z8616 Personal history of COVID-19: Secondary | ICD-10-CM | POA: Diagnosis not present

## 2022-04-16 LAB — URINALYSIS, ROUTINE W REFLEX MICROSCOPIC
Bilirubin Urine: NEGATIVE
Glucose, UA: NEGATIVE mg/dL
Hgb urine dipstick: NEGATIVE
Ketones, ur: NEGATIVE mg/dL
Leukocytes,Ua: NEGATIVE
Nitrite: NEGATIVE
Protein, ur: NEGATIVE mg/dL
Specific Gravity, Urine: 1.031 — ABNORMAL HIGH (ref 1.005–1.030)
pH: 5 (ref 5.0–8.0)

## 2022-04-16 LAB — HCG, QUANTITATIVE, PREGNANCY: hCG, Beta Chain, Quant, S: 20789 m[IU]/mL — ABNORMAL HIGH (ref <5)

## 2022-04-16 LAB — CBC
HCT: 34.8 % — ABNORMAL LOW (ref 36.0–46.0)
Hemoglobin: 11.1 g/dL — ABNORMAL LOW (ref 12.0–15.0)
MCH: 24 pg — ABNORMAL LOW (ref 26.0–34.0)
MCHC: 31.9 g/dL (ref 30.0–36.0)
MCV: 75.2 fL — ABNORMAL LOW (ref 80.0–100.0)
Platelets: 241 10*3/uL (ref 150–400)
RBC: 4.63 MIL/uL (ref 3.87–5.11)
RDW: 15.9 % — ABNORMAL HIGH (ref 11.5–15.5)
WBC: 8.2 10*3/uL (ref 4.0–10.5)
nRBC: 0 % (ref 0.0–0.2)

## 2022-04-16 NOTE — MAU Note (Signed)
Wendy Gutierrez is a 27 y.o. at [redacted]w[redacted]d here in MAU reporting: lower abdominal and left shoulder pain started today. Also feeling dizzy. No bleeding. No abnormal discharge.  LMP: 02/24/22  Onset of complaint: today  Pain score: abdomen 8/10, shoulder 10/10  Vitals:   04/16/22 1722  BP: 111/62  Pulse: 68  Resp: 16  Temp: 98.9 F (37.2 C)  SpO2: 100%     FHT:NA  Lab orders placed from triage: UA

## 2022-04-16 NOTE — MAU Provider Note (Signed)
History     [id number]  Arrival date and time: 04/16/22 1657    Chief Complaint  Patient presents with   Abdominal Pain   Shoulder Pain   Dizziness     HPI Wendy Gutierrez is a 27 y.o. at [redacted]w[redacted]d who presents for abdominal pain, light headedness, & shoulder pain. Reports some intermittent lightheadedness since last night.  Not consistent.  No syncopal episodes.  States she has had decreased appetite for the last few weeks so has only been eating 1 meal a day. Woke up this morning with left upper arm and shoulder pain.  No known injury or increase in activity.  Has not treated symptoms. Also reports abdominal pain in her right lower abdomen started this morning.  Pain is intermittent.  Denies vomiting, diarrhea, constipation, dysuria, vaginal bleeding, vaginal discharge.  OB History     Gravida  5   Para  3   Term  3   Preterm  0   AB  1   Living  3      SAB  0   IAB  1   Ectopic  0   Multiple  0   Live Births  3           Past Medical History:  Diagnosis Date   COVID 11/2019   fever x 2 days all symptoms resolved   Headache    Vitamin D deficiency     Past Surgical History:  Procedure Laterality Date   DILATION AND EVACUATION N/A 08/21/2020   Procedure: DILATATION AND EVACUATION;  Surgeon: Sherian Rein, MD;  Location: [facility];  Service: Gynecology;  Laterality: N/A;    Family History  Problem Relation Age of Onset   Healthy Mother    Breast cancer Maternal Grandmother    Diabetes Maternal Uncle     Allergies  Allergen Reactions   Amoxicillin Rash    No current facility-administered medications on file prior to encounter.   Current Outpatient Medications on File Prior to Encounter  Medication Sig Dispense Refill   acetaminophen (TYLENOL) 325 MG tablet Take 2 tablets (650 mg total) by mouth every 4 (four) hours as needed (for pain scale < 4).     ferrous sulfate 325 (65 FE) MG tablet Take 1 tablet (325 mg total)  by mouth every other day. 30 tablet 2   ibuprofen (ADVIL) 600 MG tablet Take 1 tablet (600 mg total) by mouth every 6 (six) hours. 30 tablet 0   Prenat-FePoly-Metf-FA-DHA-DSS (VITAFOL FE+) 90-1-200 & 50 MG CPPK Take 1 tablet by mouth daily. 30 each 12     ROS Pertinent positives and negative per HPI, all others reviewed and negative  Physical Exam   BP 111/62 (BP Location: Right Arm)   Pulse 68   Temp 98.9 F (37.2 C) (Oral)   Resp 16   Ht 5\' 7"  (1.702 m)   Wt 103.8 kg   LMP 02/24/2022 (Exact Date)   SpO2 100% Comment: room air  BMI 35.84 kg/m   Patient Vitals for the past 24 hrs:  BP Temp Temp src Pulse Resp SpO2 Height Weight  04/16/22 1722 111/62 98.9 F (37.2 C) Oral 68 16 100 % -- --  04/16/22 1717 -- -- -- -- -- -- 5\' 7"  (1.702 m) 103.8 kg    Physical Exam Vitals and nursing note reviewed.  Constitutional:      General: She is not in acute distress.    Appearance: She is well-developed.  HENT:  Head: Normocephalic and atraumatic.  Pulmonary:     Effort: Pulmonary effort is normal. No respiratory distress.  Abdominal:     General: There is no distension.     Palpations: Abdomen is soft.     Tenderness: There is no abdominal tenderness. There is no guarding or rebound.  Skin:    General: Skin is warm and dry.  Neurological:     Mental Status: She is alert.       Labs Results for orders placed or performed during the hospital encounter of 04/16/22 (from the past 24 hour(s))  CBC     Status: Abnormal   Collection Time: 04/16/22  6:10 PM  Result Value Ref Range   WBC 8.2 4.0 - 10.5 K/uL   RBC 4.63 3.87 - 5.11 MIL/uL   Hemoglobin 11.1 (L) 12.0 - 15.0 g/dL   HCT 34.8 (L) 36.0 - 46.0 %   MCV 75.2 (L) 80.0 - 100.0 fL   MCH 24.0 (L) 26.0 - 34.0 pg   MCHC 31.9 30.0 - 36.0 g/dL   RDW 15.9 (H) 11.5 - 15.5 %   Platelets 241 150 - 400 K/uL   nRBC 0.0 0.0 - 0.2 %    Imaging US OB LESS THAN 14 WEEKS WITH OB TRANSVAGINAL  Result Date: 04/16/2022 CLINICAL  DATA:  Abdominal pain. EXAM: OBSTETRIC <14 WK [place] AND TRANSVAGINAL OB US TECHNIQUE: Both transabdominal and transvaginal ultrasound examinations were performed for complete evaluation of the gestation as well as the maternal uterus, adnexal regions, and pelvic cul-de-sac. Transvaginal technique was performed to assess early pregnancy. COMPARISON:  None Available. FINDINGS: Intrauterine gestational sac: Single Yolk sac:  Visualized. Embryo:  Visualized. Cardiac Activity: Visualized. Heart Rate: 98 bpm MSD: 17.7 mm   6 w   5 d CRL:  4.4 mm   6 w   1 d                  [place] [facility]: December 09, 2022 Subchorionic hemorrhage:  None visualized. Maternal uterus/adnexae: The bilateral ovaries are visualized and are normal in appearance. No pelvic free fluid is noted. IMPRESSION: Single, viable intrauterine pregnancy at approximately 6 weeks and 1 day gestation by ultrasound evaluation. Electronically Signed   By: Virgina Norfolk M.D.   On: 04/16/2022 19:11    MAU Course  Procedures Lab Orders         CBC         hCG, quantitative, pregnancy         Urinalysis, Routine w reflex microscopic Urine, Clean Catch    No orders of the defined types were placed in this encounter.  Imaging Orders         US OB LESS THAN 14 WEEKS WITH OB TRANSVAGINAL     MDM Benign abdominal exam.  Ultrasound shows live IUP measuring [redacted]w[redacted]d, EDD updated  Hemoglobin & vital signs stable. Dizziness related to decrease in intake.  Discussed eating every 3 hours and drinking plenty of water throughout the day.  Left shoulder pain likely musculoskeletal.  Recommend Tylenol and heat/ice.  If does not improve should follow-up with PCP or urgent care. Assessment and Plan   1. Abdominal pain during pregnancy in first trimester   2. Normal IUP (intrauterine pregnancy) on prenatal ultrasound, first trimester   3. [redacted] weeks gestation of pregnancy    -Schedule new OB with [facility] -U/a pending -Reviewed SAB precautions & reasons to return to  MAU  Jorje Guild, NP 04/16/22 7:27 PM

## 2022-04-16 NOTE — Discharge Instructions (Signed)
Return to care  If you have heavier bleeding that soaks through more than 2 pads per hour for an hour or more If you bleed so much that you feel like you might pass out or you do pass out If you have significant abdominal pain that is not improved with Tylenol   

## 2022-05-28 DIAGNOSIS — K529 Noninfective gastroenteritis and colitis, unspecified: Secondary | ICD-10-CM | POA: Diagnosis not present

## 2022-08-15 ENCOUNTER — Encounter: Payer: Self-pay | Admitting: Family Medicine

## 2022-08-15 ENCOUNTER — Ambulatory Visit (INDEPENDENT_AMBULATORY_CARE_PROVIDER_SITE_OTHER): Payer: BC Managed Care – PPO | Admitting: Family Medicine

## 2022-08-15 VITALS — BP 108/75 | HR 95 | Temp 98.4°F | Ht 66.0 in | Wt 238.2 lb

## 2022-08-15 DIAGNOSIS — E6609 Other obesity due to excess calories: Secondary | ICD-10-CM

## 2022-08-15 DIAGNOSIS — Z6838 Body mass index (BMI) 38.0-38.9, adult: Secondary | ICD-10-CM | POA: Diagnosis not present

## 2022-08-15 DIAGNOSIS — Z7689 Persons encountering health services in other specified circumstances: Secondary | ICD-10-CM | POA: Diagnosis not present

## 2022-08-15 MED ORDER — PHENTERMINE HCL 37.5 MG PO TABS
37.5000 mg | ORAL_TABLET | Freq: Every day | ORAL | 0 refills | Status: DC
Start: 2022-08-15 — End: 2022-09-22

## 2022-08-15 NOTE — Progress Notes (Unsigned)
Patient is here to established care with provider today. Patient has many health concern they would like to discuss with provider today  Care gaps discuss at appointment today  

## 2022-08-18 ENCOUNTER — Encounter: Payer: Self-pay | Admitting: Family Medicine

## 2022-08-18 NOTE — Progress Notes (Signed)
New Patient Office Visit  Subjective    Patient ID: Wendy Gutierrez, female    DOB: 06-14-1995  Age: 27 y.o. MRN: [id number]  CC:  Chief Complaint  Patient presents with   Establish Care    HPI Wendy Gutierrez presents to establish care and for routine weight management. Patient denies acute complaints.    Outpatient Encounter Medications as of 08/15/2022  Medication Sig   phentermine (ADIPEX-P) 37.5 MG tablet Take 1 tablet (37.5 mg total) by mouth daily before breakfast.   Prenat-FePoly-Metf-FA-DHA-DSS (VITAFOL FE+) 90-1-200 & 50 MG CPPK Take 1 tablet by mouth daily.   [DISCONTINUED] acetaminophen (TYLENOL) 325 MG tablet Take 2 tablets (650 mg total) by mouth every 4 (four) hours as needed (for pain scale < 4).   [DISCONTINUED] ferrous sulfate 325 (65 FE) MG tablet Take 1 tablet (325 mg total) by mouth every other day.   No facility-administered encounter medications on file as of 08/15/2022.    Past Medical History:  Diagnosis Date   COVID 11/2019   fever x 2 days all symptoms resolved   Headache    Vitamin D deficiency     Past Surgical History:  Procedure Laterality Date   DILATION AND EVACUATION N/A 08/21/2020   Procedure: DILATATION AND EVACUATION;  Surgeon: Sherian Rein, MD;  Location: [facility];  Service: Gynecology;  Laterality: N/A;    Family History  Problem Relation Age of Onset   Healthy Mother    Breast cancer Maternal Grandmother    Diabetes Maternal Uncle     Social History   Socioeconomic History   Marital status: Married    Spouse name: Not on file   Number of children: 0   Years of education: 13   Highest education level: Not on file  Occupational History   Occupation: Bakery   Occupation: Hostess  Tobacco Use   Smoking status: Never   Smokeless tobacco: Never  Building services engineer Use: Never used  Substance and Sexual Activity   Alcohol use: Not Currently   Drug use: Not Currently   Sexual activity: Yes     Partners: Male    Birth control/protection: None  Other Topics Concern   Not on file  Social History Narrative   ** Merged History Encounter **       Lives at home with mother. Right-handed. 2-3 cups caffeine per day.   Social Determinants of Health   Financial Resource Strain: Not on file  Food Insecurity: Not on file  Transportation Needs: Not on file  Physical Activity: Not on file  Stress: Not on file  Social Connections: Not on file  Intimate Partner Violence: Not on file    Review of Systems  All other systems reviewed and are negative.       Objective    BP 108/75   Pulse 95   Temp 98.4 F (36.9 C) (Oral)   Ht 5\' 6"  (1.676 m)   Wt 238 lb 3.2 oz (108 kg)   LMP 02/24/2022 (Exact Date)   Breastfeeding Unknown   BMI 38.45 kg/m   Physical Exam Vitals and nursing note reviewed.  Constitutional:      General: She is not in acute distress.    Appearance: She is obese.  Cardiovascular:     Rate and Rhythm: Normal rate and regular rhythm.  Pulmonary:     Effort: Pulmonary effort is normal.     Breath sounds: Normal breath sounds.  Neurological:     General:  No focal deficit present.     Mental Status: She is alert and oriented to person, place, and time.         Assessment & Plan:   1. Encounter for weight management Phentermine prescribed. Goal is 4-6lbs/mo wt loss.   2. Class 2 obesity due to excess calories without serious comorbidity with body mass index (BMI) of 38.0 to 38.9 in adult Discussed dietary and activity options.   3. Encounter to establish care   Return in about 4 weeks (around 09/12/2022) for follow up.   [name], MD

## 2022-09-22 ENCOUNTER — Ambulatory Visit (INDEPENDENT_AMBULATORY_CARE_PROVIDER_SITE_OTHER): Payer: BC Managed Care – PPO | Admitting: Family Medicine

## 2022-09-22 ENCOUNTER — Encounter: Payer: Self-pay | Admitting: Family Medicine

## 2022-09-22 VITALS — BP 117/77 | HR 81 | Temp 98.1°F | Resp 16 | Wt 231.0 lb

## 2022-09-22 DIAGNOSIS — E6609 Other obesity due to excess calories: Secondary | ICD-10-CM

## 2022-09-22 DIAGNOSIS — Z6837 Body mass index (BMI) 37.0-37.9, adult: Secondary | ICD-10-CM

## 2022-09-22 DIAGNOSIS — Z7689 Persons encountering health services in other specified circumstances: Secondary | ICD-10-CM | POA: Diagnosis not present

## 2022-09-22 MED ORDER — PHENTERMINE HCL 37.5 MG PO TABS
37.5000 mg | ORAL_TABLET | Freq: Every day | ORAL | 0 refills | Status: DC
Start: 2022-09-22 — End: 2022-09-22

## 2022-09-22 MED ORDER — PHENTERMINE HCL 37.5 MG PO TABS
37.5000 mg | ORAL_TABLET | Freq: Every day | ORAL | 0 refills | Status: DC
Start: 2022-09-22 — End: 2022-10-27

## 2022-09-22 NOTE — Progress Notes (Signed)
Patient came in for monthly weight check. Patient has no other concerns today  

## 2022-09-23 ENCOUNTER — Encounter: Payer: Self-pay | Admitting: Family Medicine

## 2022-09-23 NOTE — Progress Notes (Signed)
Established Patient Office Visit  Subjective    Patient ID: Wendy Gutierrez, female    DOB: Apr 04, 1996  Age: 27 y.o. MRN: [id number]  CC:  Chief Complaint  Patient presents with   Weight Check    HPI Wendy Gutierrez presents for routine weight management. She denies acute complaints or concerns.    Outpatient Encounter Medications as of 09/22/2022  Medication Sig   Prenat-FePoly-Metf-FA-DHA-DSS (VITAFOL FE+) 90-1-200 & 50 MG CPPK Take 1 tablet by mouth daily.   [DISCONTINUED] phentermine (ADIPEX-P) 37.5 MG tablet Take 1 tablet (37.5 mg total) by mouth daily before breakfast.   phentermine (ADIPEX-P) 37.5 MG tablet Take 1 tablet (37.5 mg total) by mouth daily before breakfast.   [DISCONTINUED] phentermine (ADIPEX-P) 37.5 MG tablet Take 1 tablet (37.5 mg total) by mouth daily before breakfast.   No facility-administered encounter medications on file as of 09/22/2022.    Past Medical History:  Diagnosis Date   COVID 11/2019   fever x 2 days all symptoms resolved   Headache    Vitamin D deficiency     Past Surgical History:  Procedure Laterality Date   DILATION AND EVACUATION N/A 08/21/2020   Procedure: DILATATION AND EVACUATION;  Surgeon: Sherian Rein, MD;  Location: [facility];  Service: Gynecology;  Laterality: N/A;    Family History  Problem Relation Age of Onset   Healthy Mother    Breast cancer Maternal Grandmother    Diabetes Maternal Uncle     Social History   Socioeconomic History   Marital status: Married    Spouse name: Not on file   Number of children: 0   Years of education: 13   Highest education level: Not on file  Occupational History   Occupation: Bakery   Occupation: Hostess  Tobacco Use   Smoking status: Never   Smokeless tobacco: Never  Building services engineer Use: Never used  Substance and Sexual Activity   Alcohol use: Not Currently   Drug use: Not Currently   Sexual activity: Yes    Partners: Male    Birth  control/protection: None  Other Topics Concern   Not on file  Social History Narrative   ** Merged History Encounter **       Lives at home with mother. Right-handed. 2-3 cups caffeine per day.   Social Determinants of Health   Financial Resource Strain: Not on file  Food Insecurity: Not on file  Transportation Needs: Not on file  Physical Activity: Not on file  Stress: Not on file  Social Connections: Not on file  Intimate Partner Violence: Not on file    Review of Systems  All other systems reviewed and are negative.       Objective    BP 117/77   Pulse 81   Temp 98.1 F (36.7 C) (Oral)   Resp 16   Wt 231 lb (104.8 kg)   LMP 02/24/2022 (Exact Date)   SpO2 98%   BMI 37.28 kg/m   Physical Exam Vitals and nursing note reviewed.  Constitutional:      General: She is not in acute distress.    Appearance: She is obese.  Cardiovascular:     Rate and Rhythm: Normal rate and regular rhythm.  Pulmonary:     Effort: Pulmonary effort is normal.     Breath sounds: Normal breath sounds.  Neurological:     General: No focal deficit present.     Mental Status: She is alert and oriented to person,  place, and time.         Assessment & Plan:   1. Encounter for weight management Doing well with present management. Goal lis 3-5lbs/mo weight loss. Phentermine refilled.   2. Class 2 obesity due to excess calories without serious comorbidity with body mass index (BMI) of 38.0 to 38.9 in adult     Return in about 4 weeks (around 10/20/2022) for follow up.   Wendy Raymond, MD

## 2022-10-26 ENCOUNTER — Ambulatory Visit: Payer: BC Managed Care – PPO | Admitting: Family Medicine

## 2022-10-27 ENCOUNTER — Encounter: Payer: Self-pay | Admitting: Family Medicine

## 2022-10-27 ENCOUNTER — Ambulatory Visit (INDEPENDENT_AMBULATORY_CARE_PROVIDER_SITE_OTHER): Payer: BC Managed Care – PPO | Admitting: Family Medicine

## 2022-10-27 VITALS — BP 109/77 | HR 96 | Temp 98.1°F | Resp 16 | Wt 226.8 lb

## 2022-10-27 DIAGNOSIS — Z6836 Body mass index (BMI) 36.0-36.9, adult: Secondary | ICD-10-CM | POA: Diagnosis not present

## 2022-10-27 DIAGNOSIS — E6609 Other obesity due to excess calories: Secondary | ICD-10-CM | POA: Diagnosis not present

## 2022-10-27 DIAGNOSIS — Z7689 Persons encountering health services in other specified circumstances: Secondary | ICD-10-CM | POA: Diagnosis not present

## 2022-10-27 MED ORDER — PHENTERMINE HCL 37.5 MG PO TABS
37.5000 mg | ORAL_TABLET | Freq: Every day | ORAL | 0 refills | Status: DC
Start: 2022-10-27 — End: 2022-11-28

## 2022-10-27 NOTE — Progress Notes (Signed)
Patient came in for monthly weight check. Patient has no other concerns today

## 2022-10-28 ENCOUNTER — Encounter: Payer: Self-pay | Admitting: Family Medicine

## 2022-10-28 NOTE — Progress Notes (Signed)
Established Patient Office Visit  Subjective    Patient ID: Wendy Gutierrez, female    DOB: 04-Oct-1995  Age: 27 y.o. MRN: [id number]  CC: No chief complaint on file.   HPI Wendy Gutierrez presents for routine weight management. Patient denies acute complaints or concerns.    Outpatient Encounter Medications as of 10/27/2022  Medication Sig   Prenat-FePoly-Metf-FA-DHA-DSS (VITAFOL FE+) 90-1-200 & 50 MG CPPK Take 1 tablet by mouth daily.   [DISCONTINUED] phentermine (ADIPEX-P) 37.5 MG tablet Take 1 tablet (37.5 mg total) by mouth daily before breakfast.   phentermine (ADIPEX-P) 37.5 MG tablet Take 1 tablet (37.5 mg total) by mouth daily before breakfast.   No facility-administered encounter medications on file as of 10/27/2022.    Past Medical History:  Diagnosis Date   COVID 11/2019   fever x 2 days all symptoms resolved   Headache    Vitamin D deficiency     Past Surgical History:  Procedure Laterality Date   DILATION AND EVACUATION N/A 08/21/2020   Procedure: DILATATION AND EVACUATION;  Surgeon: Wendy Rein, MD;  Location: [facility];  Service: Gynecology;  Laterality: N/A;    Family History  Problem Relation Age of Onset   Healthy Mother    Breast cancer Maternal Grandmother    Diabetes Maternal Uncle     Social History   Socioeconomic History   Marital status: Married    Spouse name: Not on file   Number of children: 0   Years of education: 13   Highest education level: Not on file  Occupational History   Occupation: Bakery   Occupation: Hostess  Tobacco Use   Smoking status: Never   Smokeless tobacco: Never  Vaping Use   Vaping status: Never Used  Substance and Sexual Activity   Alcohol use: Not Currently   Drug use: Not Currently   Sexual activity: Yes    Partners: Male    Birth control/protection: None  Other Topics Concern   Not on file  Social History Narrative   ** Merged History Encounter **       Lives at home  with mother. Right-handed. 2-3 cups caffeine per day.   Social Determinants of Health   Financial Resource Strain: Not on file  Food Insecurity: Not on file  Transportation Needs: Not on file  Physical Activity: Not on file  Stress: Not on file  Social Connections: Not on file  Intimate Partner Violence: Not on file    Review of Systems  All other systems reviewed and are negative.       Objective    BP 109/77   Pulse 96   Temp 98.1 F (36.7 C) (Oral)   Resp 16   Wt 226 lb 12.8 oz (102.9 kg)   LMP 02/24/2022 (Exact Date)   SpO2 100%   BMI 36.61 kg/m   Physical Exam Vitals and nursing note reviewed.  Constitutional:      General: She is not in acute distress.    Appearance: She is obese.  Cardiovascular:     Rate and Rhythm: Normal rate and regular rhythm.  Pulmonary:     Effort: Pulmonary effort is normal.     Breath sounds: Normal breath sounds.  Neurological:     General: No focal deficit present.     Mental Status: She is alert and oriented to person, place, and time.          Assessment & Plan:   1. Encounter for weight management Doing well.  Phentermine refilled.   2. Class 2 obesity due to excess calories without serious comorbidity with body mass index (BMI) of 36.0 to 36.9 in adult    No follow-ups on file.   Wendy Raymond, MD

## 2022-11-25 DIAGNOSIS — U071 COVID-19: Secondary | ICD-10-CM | POA: Diagnosis not present

## 2022-11-25 DIAGNOSIS — R059 Cough, unspecified: Secondary | ICD-10-CM | POA: Diagnosis not present

## 2022-11-28 ENCOUNTER — Encounter: Payer: Self-pay | Admitting: Family Medicine

## 2022-11-28 ENCOUNTER — Ambulatory Visit (INDEPENDENT_AMBULATORY_CARE_PROVIDER_SITE_OTHER): Payer: BC Managed Care – PPO | Admitting: Family Medicine

## 2022-11-28 DIAGNOSIS — Z6835 Body mass index (BMI) 35.0-35.9, adult: Secondary | ICD-10-CM

## 2022-11-28 DIAGNOSIS — E6609 Other obesity due to excess calories: Secondary | ICD-10-CM | POA: Diagnosis not present

## 2022-11-28 DIAGNOSIS — Z7689 Persons encountering health services in other specified circumstances: Secondary | ICD-10-CM

## 2022-11-28 MED ORDER — PHENTERMINE HCL 37.5 MG PO TABS: 37.5000 mg | ORAL_TABLET | Freq: Every day | ORAL | 0 refills | Status: DC

## 2022-11-29 ENCOUNTER — Encounter: Payer: Self-pay | Admitting: Family Medicine

## 2022-11-29 NOTE — Progress Notes (Signed)
Established Patient Office Visit  Subjective    Patient ID: Wendy Gutierrez, female    DOB: 10/10/1995  Age: 27 y.o. MRN: [id number]  CC:  Chief Complaint  Patient presents with   Weight Check    HPI Wendy Gutierrez presents for routine annual exam. Patient denies acute complaints.    Outpatient Encounter Medications as of 11/28/2022  Medication Sig   [DISCONTINUED] phentermine (ADIPEX-P) 37.5 MG tablet Take 1 tablet (37.5 mg total) by mouth daily before breakfast.   phentermine (ADIPEX-P) 37.5 MG tablet Take 1 tablet (37.5 mg total) by mouth daily before breakfast.   Prenat-FePoly-Metf-FA-DHA-DSS (VITAFOL FE+) 90-1-200 & 50 MG CPPK Take 1 tablet by mouth daily. (Patient not taking: Reported on 11/28/2022)   No facility-administered encounter medications on file as of 11/28/2022.    Past Medical History:  Diagnosis Date   COVID 11/2019   fever x 2 days all symptoms resolved   Headache    Vitamin D deficiency     Past Surgical History:  Procedure Laterality Date   DILATION AND EVACUATION N/A 08/21/2020   Procedure: DILATATION AND EVACUATION;  Surgeon: Wendy Rein, MD;  Location: [facility];  Service: Gynecology;  Laterality: N/A;    Family History  Problem Relation Age of Onset   Healthy Mother    Breast cancer Maternal Grandmother    Diabetes Maternal Uncle     Social History   Socioeconomic History   Marital status: Married    Spouse name: Not on file   Number of children: 0   Years of education: 13   Highest education level: Not on file  Occupational History   Occupation: Bakery   Occupation: Hostess  Tobacco Use   Smoking status: Never   Smokeless tobacco: Never  Vaping Use   Vaping status: Never Used  Substance and Sexual Activity   Alcohol use: Not Currently   Drug use: Not Currently   Sexual activity: Yes    Partners: Male    Birth control/protection: None  Other Topics Concern   Not on file  Social History Narrative    ** Merged History Encounter **       Lives at home with mother. Right-handed. 2-3 cups caffeine per day.   Social Determinants of Health   Financial Resource Strain: Not on file  Food Insecurity: Not on file  Transportation Needs: Not on file  Physical Activity: Not on file  Stress: Not on file  Social Connections: Unknown (11/28/2022)   Social Connection and Isolation Panel [NHANES]    Frequency of Communication with Friends and Family: Not on file    Frequency of Social Gatherings with Friends and Family: Not on file    Attends Religious Services: Not on file    Active Member of Clubs or Organizations: No    Attends Banker Meetings: Not on file    Marital Status: Not on file  Intimate Partner Violence: Not on file    Review of Systems  All other systems reviewed and are negative.       Objective    BP 132/84   Pulse 83   Temp 98.7 F (37.1 C) (Oral)   Ht 5\' 7"  (1.702 m)   Wt 225 lb (102.1 kg)   LMP 11/04/2022 (Exact Date)   SpO2 98%   BMI 35.24 kg/m   Physical Exam Vitals and nursing note reviewed.  Constitutional:      General: She is not in acute distress.    Appearance: She  is obese.  Cardiovascular:     Rate and Rhythm: Normal rate and regular rhythm.  Pulmonary:     Effort: Pulmonary effort is normal.     Breath sounds: Normal breath sounds.  Neurological:     General: No focal deficit present.     Mental Status: She is alert and oriented to person, place, and time.         Assessment & Plan:   1. Encounter for weight management Discussed compliance.   2. Class 2 obesity due to excess calories without serious comorbidity with body mass index (BMI) of 35.0 to 35.9 in adult     Return in about 4 weeks (around 12/26/2022) for follow up.   Wendy Raymond, MD

## 2022-12-16 ENCOUNTER — Ambulatory Visit: Payer: Self-pay

## 2022-12-16 NOTE — Telephone Encounter (Signed)
Chief Complaint: Medication question  Symptoms: Mild to Moderate chest pain  Frequency: comes and goes  Pertinent Negatives: Patient denies current symptoms  Disposition: [] ED /[] Urgent Care (no appt availability in office) / [] Appointment(In office/virtual)/ []  [facility]/ [] Home Care/ [] Refused Recommended Disposition /[] [facility]/ [x]  Follow-up with PCP Additional Notes: Patient states that she was prescribed Phentermine 11/28/22 by PCP, when she went to pick it up from the pharmacy they did not have it ready so she started taking the Phentermine that she had left over from a previous Rx. Patient stated that she took it at night because she works at night. Patient reports have mild to moderate chest pain x 3 days in a row while taking the medication. Patient stopped taking the medication and has not had any chest pain since discontinuing the medication. Patient reports never picking up the new Rx that Dr. Andrey Campanile prescribed. Patient wants to know if she should try taking the medication again or discontinue since it is a short-term therapy. Care advice given advising the patient hold mediation until provider gives additional recommendations. Patient verbalized understanding.   Summary: med reaction   Pt called in , states the med, phentermine (ADIPEX-P) 37.5 MG tablet , gives her chest pains, so she stopped taking it     Reason for Disposition  [1] Caller has NON-URGENT medicine question about med that PCP prescribed AND [2] triager unable to answer question  Answer Assessment - Initial Assessment Questions 1. NAME of MEDICINE: "What medicine(s) are you calling about?"     phentermine (ADIPEX-P) 37.5 MG tablet  2. QUESTION: "What is your question?" (e.g., double dose of medicine, side effect)     Should I continue taking the medication at this time? I had chest pains the when I started taking it recently.  3. PRESCRIBER: "Who prescribed the medicine?" Reason: if  prescribed by specialist, call should be referred to that group.     Dr. Andrey Campanile  4. SYMPTOMS: "Do you have any symptoms?" If Yes, ask: "What symptoms are you having?"  "How bad are the symptoms (e.g., mild, moderate, severe)     Mild-Moderate chest pain  Protocols used: Medication Question Call-A-AH

## 2022-12-30 ENCOUNTER — Ambulatory Visit: Payer: BC Managed Care – PPO | Admitting: Family Medicine

## 2023-02-15 ENCOUNTER — Ambulatory Visit: Payer: Self-pay

## 2023-02-15 NOTE — Telephone Encounter (Signed)
Chief Complaint: Chest pain  Symptoms: mild chest pain in the center, stress Frequency: comes and goes started in September  Pertinent Negatives: Patient denies coughing, sweating, nausea, vomiting, Disposition: [] ED /[x] Urgent Care (no appt availability in office) / [] Appointment(In office/virtual)/ []  [facility]/ [] Home Care/ [] Refused Recommended Disposition /[] [facility]/ []  Follow-up with PCP Additional Notes: Patient stated she has been experiencing mild chest pain in the center on her chest for about 2 months now, it only last a few seconds and happens on average about twice a day. Patient states she is unsure if it is stress related but it started around the same time she stopped taking weight loss medicine. Patient states she works overnight and does a lot of moving and lifting so it could have something to do with her chest discomfort as well. Care advice was given and patient has been scheduled at urgent care tomorrow at 10am due to no appointment availability in  the office this week. Patient has also been scheduled for a follow-up with PCP at first available appointment 03/20/23.  Reason for Disposition  [1] Chest pain(s) lasting a few seconds AND [2] persists > 3 days  Answer Assessment - Initial Assessment Questions 1. LOCATION: "Where does it hurt?"       Middle of my chest  2. RADIATION: "Does the pain go anywhere else?" (e.g., into neck, jaw, arms, back)     No  3. ONSET: "When did the chest pain begin?" (Minutes, hours or days)      Around September 4. PATTERN: "Does the pain come and go, or has it been constant since it started?"  "Does it get worse with exertion?"      Come and go 5. DURATION: "How long does it last" (e.g., seconds, minutes, hours)     Few seconds twice a day  6. SEVERITY: "How bad is the pain?"  (e.g., Scale 1-10; mild, moderate, or severe)    - MILD (1-3): doesn't interfere with normal activities     - MODERATE (4-7): interferes  with normal activities or awakens from sleep    - SEVERE (8-10): excruciating pain, unable to do any normal activities       Mild chest pain  7. CARDIAC RISK FACTORS: "Do you have any history of heart problems or risk factors for heart disease?" (e.g., angina, prior heart attack; diabetes, high blood pressure, high cholesterol, smoker, or strong family history of heart disease)     No  8. PULMONARY RISK FACTORS: "Do you have any history of lung disease?"  (e.g., blood clots in lung, asthma, emphysema, birth control pills)     No  9. CAUSE: "What do you think is causing the chest pain?"     I'm not sure 10. OTHER SYMPTOMS: "Do you have any other symptoms?" (e.g., dizziness, nausea, vomiting, sweating, fever, difficulty breathing, cough)       Stress  Protocols used: Chest Pain-A-AH

## 2023-02-16 ENCOUNTER — Other Ambulatory Visit: Payer: Self-pay

## 2023-02-16 ENCOUNTER — Ambulatory Visit
Admission: RE | Admit: 2023-02-16 | Discharge: 2023-02-16 | Disposition: A | Discharge status: Home / Self Care | Payer: BC Managed Care – PPO | Source: Ambulatory Visit | Attending: Internal Medicine | Admitting: Internal Medicine

## 2023-02-16 VITALS — BP 104/74 | HR 69 | Temp 98.4°F | Resp 16

## 2023-02-16 DIAGNOSIS — R079 Chest pain, unspecified: Secondary | ICD-10-CM | POA: Diagnosis not present

## 2023-02-16 DIAGNOSIS — K921 Melena: Secondary | ICD-10-CM

## 2023-02-16 NOTE — ED Triage Notes (Addendum)
Pt states CP on and off since September.  Pt also states blood in stool on off.  States she tried to make and appointment with her primary  care doctor but they didn't have anything for a while.

## 2023-02-16 NOTE — Discharge Instructions (Signed)
Blood work is pending.  Your EKG was normal.  I have placed referrals to both stomach doctor and heart doctor.  If they did not call you within the next few days, please call them yourself at provided phone numbers.  Please keep scheduled appointment with primary care doctor.  Go to the ER if symptoms persist or worsen.

## 2023-02-16 NOTE — ED Provider Notes (Signed)
[facility]    CSN: [id number] Arrival date & time: 02/16/23  0948      History   Chief Complaint Chief Complaint  Patient presents with   Chest Pain    HPI Wendy Gutierrez is a 27 y.o. female.   Patient presents with 2 different chief complaints today.  Reports that she has been having chest pain intermittently for the past 2 to 3 months.  Reports that it is not a daily occurrence but it does happen multiple times a day when it does occur.  Reports that it is described as a "aching pain" that only lasts less than a minute.  Denies any aggravating or relieving factors to chest pain.  She has scheduled appointment with PCP for evaluation of this on 12/9.  Denies any previous history of cardiac or pulmonary problems.  Reports that the pain started after she started taking phentermine for weight loss.  She told her PCP about this and they advised her to discontinue it but chest pain has been persistent and seems to be occurring more frequently.  Denies any associated dizziness, blurred vision, headache, nausea, vomiting, shortness of breath.  She did get COVID prior to chest pain starting but is not sure if this is related.  Denies any recent or persistent coughing.  Denies any fever.  She does not use any form of birth control.  Patient also reporting intermittent noticeable blood in stool.  Reports that she noticed it a few months ago and then yesterday.  It is not bright red blood per patient report and it is only a small amount.  She does report previous history of hemorrhoids but denies any current hemorrhoid at this time.  States that she has been straining to have a bowel movement and was told by nursing during triage that this could be related.  Denies nausea, vomiting, abdominal pain, diarrhea.  Denies any recent constipation. Having normal bowel movements.    Chest Pain   Past Medical History:  Diagnosis Date   COVID 11/2019   fever x 2 days all symptoms resolved    Headache    Vitamin D deficiency     Patient Active Problem List   Diagnosis Date Noted   Alpha thalassemia silent carrier 03/24/2021   Arthropathy of lumbar facet joint 07/09/2019   Low grade squamous intraepithelial lesion (LGSIL) on cervicovaginal cytologic smear 10/10/2016   Pregnancy 09/29/2016   Migraine without aura and without status migrainosus, not intractable 07/24/2014    Past Surgical History:  Procedure Laterality Date   DILATION AND EVACUATION N/A 08/21/2020   Procedure: DILATATION AND EVACUATION;  Surgeon: Sherian Rein, MD;  Location: [facility];  Service: Gynecology;  Laterality: N/A;    OB History     Gravida  5   Para  3   Term  3   Preterm  0   AB  1   Living  3      SAB  0   IAB  1   Ectopic  0   Multiple  0   Live Births  3            Home Medications    Prior to Admission medications   Medication Sig Start Date End Date Taking? Authorizing Provider  phentermine (ADIPEX-P) 37.5 MG tablet Take 1 tablet (37.5 mg total) by mouth daily before breakfast. 11/28/22   Georganna Skeans, MD  Prenat-FePoly-Metf-FA-DHA-DSS (VITAFOL FE+) 90-1-200 & 50 MG CPPK Take 1 tablet by mouth daily.  Patient not taking: Reported on 11/28/2022 01/11/21   Constant, Peggy, MD    Family History Family History  Problem Relation Age of Onset   Healthy Mother    Breast cancer Maternal Grandmother    Diabetes Maternal Uncle     Social History Social History   Tobacco Use   Smoking status: Never   Smokeless tobacco: Never  Vaping Use   Vaping status: Never Used  Substance Use Topics   Alcohol use: Not Currently   Drug use: Not Currently     Allergies   Amoxicillin   Review of Systems Review of Systems Per HPI  Physical Exam Triage Vital Signs ED Triage Vitals  Encounter Vitals Group     BP 02/16/23 1108 104/74     Systolic BP Percentile --      Diastolic BP Percentile --      Pulse Rate 02/16/23 1108 69     Resp  02/16/23 1108 16     Temp 02/16/23 1108 98.4 F (36.9 C)     Temp Source 02/16/23 1108 Oral     SpO2 02/16/23 1108 98 %     Weight --      Height --      Head Circumference --      Peak Flow --      Pain Score 02/16/23 1109 0     Pain Loc --      Pain Education --      Exclude from Growth Chart --    No data found.  Updated Vital Signs BP 104/74 (BP Location: Left Arm)   Pulse 69   Temp 98.4 F (36.9 C) (Oral)   Resp 16   LMP 01/30/2023 (Exact Date)   SpO2 98%   Breastfeeding No   Visual Acuity Right Eye Distance:   Left Eye Distance:   Bilateral Distance:    Right Eye Near:   Left Eye Near:    Bilateral Near:     Physical Exam Constitutional:      General: She is not in acute distress.    Appearance: Normal appearance. She is not toxic-appearing or diaphoretic.  HENT:     Head: Normocephalic and atraumatic.  Eyes:     Extraocular Movements: Extraocular movements intact.     Conjunctiva/sclera: Conjunctivae normal.  Cardiovascular:     Rate and Rhythm: Normal rate and regular rhythm.     Pulses: Normal pulses.     Heart sounds: Normal heart sounds.  Pulmonary:     Effort: Pulmonary effort is normal. No respiratory distress.     Breath sounds: Normal breath sounds.  Abdominal:     General: Bowel sounds are normal. There is no distension.     Palpations: Abdomen is soft.     Tenderness: There is no abdominal tenderness.  Genitourinary:    Comments: Patient declined rectal examination.  Neurological:     General: No focal deficit present.     Mental Status: She is alert and oriented to person, place, and time. Mental status is at baseline.  Psychiatric:        Mood and Affect: Mood normal.        Behavior: Behavior normal.        Thought Content: Thought content normal.        Judgment: Judgment normal.      UC Treatments / Results  Labs (all labs ordered are listed, but only abnormal results are displayed) Labs Reviewed  CBC  BASIC METABOLIC  PANEL  EKG   Radiology No results found.  Procedures Procedures (including critical care time)  Medications Ordered in UC Medications - No data to display  Initial Impression / Assessment and Plan / UC Course  I have reviewed the triage vital signs and the nursing notes.  Pertinent labs & imaging results that were available during my care of the patient were reviewed by me and considered in my medical decision making (see chart for details).     1.  Chest pain  EKG is normal sinus rhythm. HEAR score is 0 and given duration of time since chest pain started, I have a low concern for any acute cardiopulmonary process at this time.  Therefore, do not think that emergent evaluation is necessary.  There are no adventitious lung sounds on exam, no shortness of breath, no tachypnea, and oxygen is normal so do not think that chest imaging is necessary at this time.  Will obtain BMP and CBC.  Ambulatory referral to cardiology was placed for further evaluation and management.  Patient also has follow-up appointment with PCP on 12/9.  Encouraged strict ER precautions regarding chest pain.  Differential diagnoses include medication related chest pain versus anxiety versus stress versus musculoskeletal chest pain.  2.  Blood in stool  Patient reports only small amount of blood in stool which has been intermittent for the past few months.  Vital signs are stable and patient is not having any associated symptoms, so do not think that emergent evaluation is necessary.  Patient declined rectal evaluation.  Will obtain CBC.  Ambulatory referral to gastroenterology was placed.  Advised strict follow-up precautions and ER precautions.  Patient verbalized understanding and was agreeable with plan. Final Clinical Impressions(s) / UC Diagnoses   Final diagnoses:  Chest pain, unspecified type  Blood in stool     Discharge Instructions      Blood work is pending.  Your EKG was normal.  I have placed  referrals to both stomach doctor and heart doctor.  If they did not call you within the next few days, please call them yourself at provided phone numbers.  Please keep scheduled appointment with primary care doctor.  Go to the ER if symptoms persist or worsen.    ED Prescriptions   None    PDMP not reviewed this encounter.   Gustavus Bryant, [place] 02/16/23 1215

## 2023-02-17 LAB — CBC
Hematocrit: 37.8 % (ref 34.0–46.6)
Hemoglobin: 11.7 g/dL (ref 11.1–15.9)
MCH: 24.5 pg — ABNORMAL LOW (ref 26.6–33.0)
MCHC: 31 g/dL — ABNORMAL LOW (ref 31.5–35.7)
MCV: 79 fL (ref 79–97)
Platelets: 243 10*3/uL (ref 150–450)
RBC: 4.78 x10E6/uL (ref 3.77–5.28)
RDW: 14.2 % (ref 11.7–15.4)
WBC: 6.1 10*3/uL (ref 3.4–10.8)

## 2023-02-17 LAB — BASIC METABOLIC PANEL
BUN/Creatinine Ratio: 18 (ref 9–23)
BUN: 14 mg/dL (ref 6–20)
CO2: 17 mmol/L — ABNORMAL LOW (ref 20–29)
Calcium: 9.1 mg/dL (ref 8.7–10.2)
Chloride: 104 mmol/L (ref 96–106)
Creatinine, Ser: 0.77 mg/dL (ref 0.57–1.00)
Glucose: 82 mg/dL (ref 70–99)
Potassium: 4.4 mmol/L (ref 3.5–5.2)
Sodium: 138 mmol/L (ref 134–144)
eGFR: 107 mL/min/{1.73_m2} (ref >59)

## 2023-02-28 ENCOUNTER — Ambulatory Visit: Payer: BC Managed Care – PPO | Admitting: Family Medicine

## 2023-02-28 VITALS — BP 109/68 | HR 77 | Temp 98.5°F | Resp 16 | Wt 226.0 lb

## 2023-02-28 DIAGNOSIS — F419 Anxiety disorder, unspecified: Secondary | ICD-10-CM

## 2023-02-28 DIAGNOSIS — R0789 Other chest pain: Secondary | ICD-10-CM | POA: Diagnosis not present

## 2023-02-28 DIAGNOSIS — E66812 Obesity, class 2: Secondary | ICD-10-CM

## 2023-02-28 DIAGNOSIS — F32A Depression, unspecified: Secondary | ICD-10-CM | POA: Diagnosis not present

## 2023-02-28 DIAGNOSIS — E6609 Other obesity due to excess calories: Secondary | ICD-10-CM

## 2023-02-28 DIAGNOSIS — Z6835 Body mass index (BMI) 35.0-35.9, adult: Secondary | ICD-10-CM

## 2023-02-28 MED ORDER — SERTRALINE HCL 50 MG PO TABS: 50.0000 mg | ORAL_TABLET | Freq: Every day | ORAL | 0 refills | Status: DC

## 2023-02-28 NOTE — Progress Notes (Unsigned)
Patient is here for chest discomfort due to taking weight loss medication. Patient is also stressed which she does not know if it has anything to do with her discomfort in  her chest

## 2023-03-02 ENCOUNTER — Encounter: Payer: Self-pay | Admitting: Family Medicine

## 2023-03-02 NOTE — Progress Notes (Signed)
Established Patient Office Visit  Subjective    Patient ID: Wendy Gutierrez, female    DOB: 1995/08/20  Age: 27 y.o. MRN: [id number]  CC:  Chief Complaint  Patient presents with   Medical Management of Chronic Issues    HPI Wendy Gutierrez presents for complaint of chest discomfort She has been evaluated and workup was unremarkable for cardiac. Patient related symptoms to use of weight loss med. She reports a significant increase of social stressors.   Outpatient Encounter Medications as of 02/28/2023  Medication Sig   sertraline (ZOLOFT) 50 MG tablet Take 1 tablet (50 mg total) by mouth daily.   Prenat-FePoly-Metf-FA-DHA-DSS (VITAFOL FE+) 90-1-200 & 50 MG CPPK Take 1 tablet by mouth daily. (Patient not taking: Reported on 11/28/2022)   [DISCONTINUED] phentermine (ADIPEX-P) 37.5 MG tablet Take 1 tablet (37.5 mg total) by mouth daily before breakfast. (Patient not taking: Reported on 02/28/2023)   No facility-administered encounter medications on file as of 02/28/2023.    Past Medical History:  Diagnosis Date   COVID 11/2019   fever x 2 days all symptoms resolved   Headache    Vitamin D deficiency     Past Surgical History:  Procedure Laterality Date   DILATION AND EVACUATION N/A 08/21/2020   Procedure: DILATATION AND EVACUATION;  Surgeon: Wendy Rein, MD;  Location: [facility];  Service: Gynecology;  Laterality: N/A;    Family History  Problem Relation Age of Onset   Healthy Mother    Breast cancer Maternal Grandmother    Diabetes Maternal Uncle     Social History   Socioeconomic History   Marital status: Married    Spouse name: Not on file   Number of children: 0   Years of education: 13   Highest education level: Bachelor's degree (e.g., BA, AB, BS)  Occupational History   Occupation: Bakery   Occupation: Hostess  Tobacco Use   Smoking status: Never   Smokeless tobacco: Never  Vaping Use   Vaping status: Never Used  Substance  and Sexual Activity   Alcohol use: Not Currently   Drug use: Not Currently   Sexual activity: Yes    Partners: Male    Birth control/protection: None  Other Topics Concern   Not on file  Social History Narrative   ** Merged History Encounter **       Lives at home with mother. Right-handed. 2-3 cups caffeine per day.   Social Determinants of Health   Financial Resource Strain: Low Risk  (02/28/2023)   Overall Financial Resource Strain (CARDIA)    Difficulty of Paying Living Expenses: Not very hard  Food Insecurity: No Food Insecurity (02/28/2023)   Hunger Vital Sign    Worried About Running Out of Food in the Last Year: Never true    Ran Out of Food in the Last Year: Never true  Transportation Needs: No Transportation Needs (02/28/2023)   PRAPARE - Administrator, Civil Service (Medical): No    Lack of Transportation (Non-Medical): No  Physical Activity: Sufficiently Active (02/28/2023)   Exercise Vital Sign    Days of Exercise per Week: 5 days    Minutes of Exercise per Session: 50 min  Stress: Stress Concern Present (02/28/2023)   [employer] of Occupational Health - Occupational Stress Questionnaire    Feeling of Stress : Rather much  Social Connections: Moderately Integrated (02/28/2023)   Social Connection and Isolation Panel [NHANES]    Frequency of Communication with Friends and Family: Three times  a week    Frequency of Social Gatherings with Friends and Family: Once a week    Attends Religious Services: More than 4 times per year    Active Member of [employer] or Organizations: No    Attends Engineer, structural: Not on file    Marital Status: Married  Catering manager Violence: Not on file    Review of Systems  Psychiatric/Behavioral:  Negative for suicidal ideas. The patient is nervous/anxious and has insomnia.   All other systems reviewed and are negative.       Objective    BP 109/68   Pulse 77   Temp 98.5 F (36.9 C) (Oral)    Resp 16   Wt 226 lb (102.5 kg)   LMP 01/30/2023 (Exact Date)   SpO2 96%   BMI 35.40 kg/m   Physical Exam Vitals and nursing note reviewed.  Constitutional:      General: She is not in acute distress.    Appearance: She is obese.  Cardiovascular:     Rate and Rhythm: Normal rate and regular rhythm.  Pulmonary:     Effort: Pulmonary effort is normal.     Breath sounds: Normal breath sounds.  Abdominal:     Palpations: Abdomen is soft.     Tenderness: There is no abdominal tenderness.  Neurological:     General: No focal deficit present.     Mental Status: She is alert and oriented to person, place, and time.         Assessment & Plan:  1. Anxiety and depression Will start zoloft 50 mg daily.   2. Atypical chest pain ? 2/2 above  3. Class 2 obesity due to excess calories without serious comorbidity with body mass index (BMI) of 35.0 to 35.9 in adult      Return in about 2 months (around 04/30/2023) for physical.   Wendy Raymond, MD

## 2023-03-15 ENCOUNTER — Ambulatory Visit: Payer: Self-pay

## 2023-03-15 NOTE — Telephone Encounter (Signed)
Chief Complaint: Bumps on vagina Symptoms: None Frequency: Noticed today Pertinent Negatives: Patient denies other vaginal symptoms Disposition: [] ED /[] Urgent Care (no appt availability in office) / [] Appointment(In office/virtual)/ []  [facility]/ [] Home Care/ [] Refused Recommended Disposition /[x] [facility]/ []  Follow-up with PCP Additional Notes: Patient says she had vaginal discharge with odor 2 weeks ago. Advised no availability in the office with any provider, advised Mobile [facility] location/time tomorrow, she verbalized understanding.    Summary: Possible STI/STD   Pt requesting to be tested for STI's/STD's. Pt states she notices an odor coming from vaginal area. Pt also has noticed a bump on vaginal area on the outside. Pt noticed odor 2 weeks ago and bump this afternoon.  Pt seeking clinical advice.     Reason for Disposition  Patient is worried they have a sexually transmitted infection (STI)  Answer Assessment - Initial Assessment Questions 1. SYMPTOM: "What's the main symptom you're concerned about?" (e.g., pain, itching, dryness)     Bumps to the vaginal area 2. LOCATION: "Where is the  bumps located?" (e.g., inside/outside, left/right)     Top of vagina, 2-3 bumps 3. ONSET: "When did the bumps start?"     Noticed it 3 hours ago 4. PAIN: "Is there any pain?" If Yes, ask: "How bad is it?" (Scale: 1-10; mild, moderate, severe)   -  MILD (1-3): Doesn't interfere with normal activities.    -  MODERATE (4-7): Interferes with normal activities (e.g., work or school) or awakens from sleep.     -  SEVERE (8-10): Excruciating pain, unable to do any normal activities.     No 5. ITCHING: "Is there any itching?" If Yes, ask: "How bad is it?" (Scale: 1-10; mild, moderate, severe)     No 6. CAUSE: "What do you think is causing the discharge?" "Have you had the same problem before? What happened then?"     No discharge now, had discharge 2 weeks ago with odor 7.  OTHER SYMPTOMS: "Do you have any other symptoms?" (e.g., fever, itching, vaginal bleeding, pain with urination, injury to genital area, vaginal foreign body)     No 8. PREGNANCY: "Is there any chance you are pregnant?" "When was your last menstrual period?"     No, LMP 02/25/23  Protocols used: Vaginal Symptoms-A-AH

## 2023-03-16 ENCOUNTER — Ambulatory Visit: Payer: BC Managed Care – PPO | Admitting: Physician Assistant

## 2023-03-16 ENCOUNTER — Other Ambulatory Visit (HOSPITAL_COMMUNITY)
Admission: RE | Admit: 2023-03-16 | Discharge: 2023-03-16 | Disposition: A | Discharge status: Home / Self Care | Payer: BC Managed Care – PPO | Source: Ambulatory Visit | Attending: Physician Assistant | Admitting: Physician Assistant

## 2023-03-16 ENCOUNTER — Encounter: Payer: Self-pay | Admitting: Physician Assistant

## 2023-03-16 VITALS — BP 115/68 | HR 83 | Ht 67.0 in | Wt 222.0 lb

## 2023-03-16 DIAGNOSIS — Z113 Encounter for screening for infections with a predominantly sexual mode of transmission: Secondary | ICD-10-CM

## 2023-03-16 DIAGNOSIS — N898 Other specified noninflammatory disorders of vagina: Secondary | ICD-10-CM

## 2023-03-16 DIAGNOSIS — B009 Herpesviral infection, unspecified: Secondary | ICD-10-CM | POA: Diagnosis not present

## 2023-03-16 DIAGNOSIS — B9689 Other specified bacterial agents as the cause of diseases classified elsewhere: Secondary | ICD-10-CM | POA: Diagnosis not present

## 2023-03-16 MED ORDER — VALACYCLOVIR HCL 500 MG PO TABS: 500.0000 mg | ORAL_TABLET | Freq: Two times a day (BID) | ORAL | 0 refills | Status: DC

## 2023-03-16 NOTE — Telephone Encounter (Signed)
I have attempted without success to contact this patient by phone to return their call and I left a message on answering machine.

## 2023-03-16 NOTE — Progress Notes (Signed)
Established Patient Office Visit  Subjective   Patient ID: Wendy Gutierrez, female    DOB: 01-21-1996  Age: 27 y.o. MRN: [id number]  Chief Complaint  Patient presents with   STD screening     Patient states she noticed bumps around her vaginal area yesterday.     States that she noticed a small cluster of bumps on her labium major yesterday.  Describes it feeling an irritation from them, but not burning.  States that she has previously not had any type of lesions.  Denies tenderness, or purulence.  Has not tried anything for relief.  States that 2 weeks ago she had some vaginal discharge along with a slight odor, but does state that has resolved on its own.  No known exposure to STIs.        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 Right-handed. 2-3 cups caffeine per day.   Social Determinants of Health   Financial Resource Strain: Low Risk  (02/28/2023)   Overall Financial Resource Strain (CARDIA)    Difficulty of Paying Living Expenses: Not very hard  Food Insecurity: No Food Insecurity (02/28/2023)   Hunger Vital Sign    Worried About Running Out of Food in the Last Year: Never true    Ran Out of Food in  the Last Year: Never true  Transportation Needs: No Transportation Needs (02/28/2023)   PRAPARE - Administrator, Civil Service (Medical): No    Lack of Transportation (Non-Medical): No  Physical Activity: Sufficiently Active (02/28/2023)   Exercise Vital Sign    Days of Exercise per Week: 5 days    Minutes of Exercise per Session: 50 min  Stress: Stress Concern Present (02/28/2023)   [employer] of Occupational Health - Occupational Stress Questionnaire    Feeling of Stress : Rather much  Social Connections: Moderately Integrated (02/28/2023)   Social Connection and Isolation Panel [NHANES]    Frequency of Communication with Friends and Family: Three times a week    Frequency of Social Gatherings with Friends and Family: Once a week    Attends Religious Services: More than 4 times per year    Active Member of [employer] or Organizations: No    Attends Engineer, structural: Not on file    Marital Status: Married  Catering manager Violence: Not on file   Family History  Problem Relation Age of Onset   Healthy Mother    Breast cancer Maternal Grandmother    Diabetes Maternal Uncle    Allergies  Allergen Reactions  Amoxicillin Rash    Review of Systems  Constitutional:  Negative for chills and fever.  HENT: Negative.    Eyes: Negative.   Respiratory:  Negative for shortness of breath.   Cardiovascular:  Negative for chest pain.  Gastrointestinal: Negative.   Genitourinary:  Negative for dysuria, frequency, hematuria and urgency.  Musculoskeletal: Negative.   Skin: Negative.   Neurological: Negative.   Endo/Heme/Allergies: Negative.   Psychiatric/Behavioral: Negative.        Objective:     BP 115/68 (BP Location: Left Arm, Patient Position: Sitting, Cuff Size: Large)   Pulse 83   Ht 5\' 7"  (1.702 m)   Wt 222 lb (100.7 kg)   LMP 02/25/2023 (Exact Date)   SpO2 95%   BMI 34.77 kg/m  BP Readings from Last 3 Encounters:  03/16/23 115/68   02/28/23 109/68  02/16/23 104/74   Wt Readings from Last 3 Encounters:  03/16/23 222 lb (100.7 kg)  02/28/23 226 lb (102.5 kg)  11/28/22 225 lb (102.1 kg)    Physical Exam Vitals and nursing note reviewed.  Constitutional:      Appearance: Normal appearance.  HENT:     Head: Normocephalic and atraumatic.     Right Ear: External ear normal.     Left Ear: External ear normal.     Mouth/Throat:     Mouth: Mucous membranes are moist.     Pharynx: Oropharynx is clear.  Eyes:     Extraocular Movements: Extraocular movements intact.     Conjunctiva/sclera: Conjunctivae normal.     Pupils: Pupils are equal, round, and reactive to light.  Cardiovascular:     Rate and Rhythm: Normal rate and regular rhythm.     Pulses: Normal pulses.     Heart sounds: Normal heart sounds.  Pulmonary:     Effort: Pulmonary effort is normal.     Breath sounds: Normal breath sounds.  Genitourinary:   Musculoskeletal:     Cervical back: Normal range of motion and neck supple.  Skin:    General: Skin is warm and dry.  Neurological:     General: No focal deficit present.     Mental Status: She is alert and oriented to person, place, and time.  Psychiatric:        Mood and Affect: Mood normal.        Behavior: Behavior normal.        Thought Content: Thought content normal.        Judgment: Judgment normal.         Assessment & Plan:   Problem List Items Addressed This Visit   None Visit Diagnoses     Vaginal lesion    -  Primary   Relevant Medications   valACYclovir (VALTREX) 500 MG tablet   Other Relevant Orders   HSV 1 and 2 Ab, IgG   Screening examination for STI       Relevant Orders   RPR   HIV antibody (with reflex)   Cervicovaginal ancillary only      1. Vaginal lesion Will treat based on clinical presentation, trial Valtrex, patient education given on supportive care.  Red flags given for prompt reevaluation - HSV 1 and 2 Ab, IgG - valACYclovir (VALTREX) 500 MG  tablet; Take 1 tablet (500 mg total) by mouth 2 (two) times daily for 3 days.  Dispense: 6 tablet; Refill: 0  2. Screening examination for STI  - RPR - HIV antibody (with reflex) - Cervicovaginal ancillary only   I  have reviewed the patient's medical history (PMH, PSH, Social History, Family History, Medications, and allergies) , and have been updated if relevant. I spent 30 minutes reviewing chart and  face to face time with patient.    Return if symptoms worsen or fail to improve.    Kasandra Knudsen Mayers, PA-C

## 2023-03-16 NOTE — Patient Instructions (Signed)
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  This will be confirmed with a blood test, however I do encourage you to begin treatment.  You will take Valtrex twice daily for 3 days.  We will call you with today's lab results.    Roney Jaffe, PA-C Physician Assistant [facility] [email or web address]   Genital Herpes Genital herpes is a common sexually transmitted infection (STI) that is caused by a virus. The virus spreads from person to person through contact with a sore, infected saliva, or infected skin. The virus can cause itching, blisters, and sores around the genitals or rectum. During an outbreak of infection, symptoms may last for several days and then go away. However, the virus remains in the body, so more outbreaks may happen in the future. The time between outbreaks varies and can be from months to years. Genital herpes can affect anyone. It is particularly concerning for pregnant women because the virus can be passed to the baby during delivery. Genital herpes is also a concern for people who have a weak disease-fighting system (immune system). What are the causes? This condition is caused by the herpes simplex virus, type 1 or type 2 (HSV-1 or HSV-2). The virus may spread through: Sexual contact with an infected person, including vaginal, anal, and oral sex. Contact with a herpes sore. The skin. This means that you can get herpes from an infected partner even if there are no blisters or sores present. Your partner may not know that he or she is infected. What increases the risk? You are more likely to develop this condition if: You have sex with many partners. You do not use latex or polyurethane condoms during sex. What are the signs or symptoms? Most people do not have symptoms or they have mild symptoms that may be mistaken for other skin problems. Symptoms may include: Small, red bumps near the genitals,  rectum, or mouth. These bumps turn into blisters and then sores. Flu-like (influenza-like) symptoms, including: Fever. Body aches. Swollen lymph nodes. Headache. Painful urination. Pain and itching in the genital area or rectal area. Vaginal discharge. Tingling or shooting pain in the legs and buttocks. Generally, symptoms are more severe and last longer during the first (primary) outbreak. Influenza-like symptoms are also more common during the primary outbreak. How is this diagnosed? This condition may be diagnosed based on: A physical exam. Your medical history. Blood tests. A test of a fluid sample (culture) from an open sore. How is this treated? There is no cure for this condition, but treatment with antiviral medicines can do the following: Speed up healing and relieve symptoms. Help to reduce the spread of the virus to sexual partners. Limit the chance of future outbreaks, or make future outbreaks shorter. Lessen symptoms of future outbreaks. Your health care provider may also recommend over-the-counter medicines to help with pain and itching. Follow these instructions at home: If you have an outbreak:  Keep the affected areas dry and clean. Avoid rubbing or touching blisters and sores. If you do touch blisters or sores: Wash your hands thoroughly with soap and water for at least 20 seconds. If soap and water are not available, use an alcohol-based hand sanitizer. Do not touch your eyes afterward. Sexual activity Do not have sexual contact during active outbreaks. Practice safe sex. Herpes can spread even if your partner does not have blisters or sores. Latex or polyurethane condoms and female condoms may help prevent the spread of the herpes virus.  Managing pain and discomfort If directed, put ice on the painful area. To do this: Put ice in a plastic bag. Place a towel between your skin and the bag. Leave the ice on for 20 minutes, 2-3 times a day. Remove the ice if  your skin turns bright red. This is very important. If you cannot feel pain, heat, or cold, you have a greater risk of damage to the area. If told, take a cool sitz bath to help relieve pain or itching. A sitz bath is a water bath that you take while sitting down in water that is deep enough to cover your hips and buttocks. General instructions Take over-the-counter and prescription medicines only as told by your health care provider. If you were prescribed an antiviral medicine, use it as told by your health care provider. Do not stop using the antiviral even if you start to feel better. Keep all follow-up visits. This is important. How is this prevented? Use condoms. Although you can get genital herpes during sexual contact even with the use of a condom, a condom can provide some protection. Avoid having multiple sexual partners. Talk with your sexual partner about any symptoms either of you may have. Also, talk with your partner about any history of STIs. Do not have sexual contact if you have active symptoms of genital herpes. Contact a health care provider if: Your symptoms are not improving with medicine. Your symptoms return, or you have new symptoms. You have a fever. You have abdominal pain. You have redness, swelling, or pain in your eye. You notice new sores on other parts of your body. You have had herpes and you become pregnant or plan to become pregnant. Get help right away if: You have symptoms of viral meningitis. This is rare but may happen if the virus spreads to the brain. Symptoms may include: Severe headache or stiff neck. Muscle aches. Nausea and vomiting. Sensitivity to light. Summary Genital herpes is a common sexually transmitted infection (STI) that is caused by the herpes simplex virus, type 1 or type 2 (HSV-1 or HSV-2). These viruses are most often spread through sexual contact with an infected person. You are more likely to develop this condition if you have  sex with many partners or you do not use condoms during sex. Most people do not have symptoms or have mild symptoms that may be mistaken for other skin problems. Symptoms occur as outbreaks that may happen months or years apart. There is no cure for this condition, but treatment with oral antiviral medicines can reduce symptoms, reduce the chance of spreading the virus to a partner, prevent future outbreaks, or shorten future outbreaks. This information is not intended to replace advice given to you by your health care provider. Make sure you discuss any questions you have with your health care provider. Document Revised: 12/31/2020 Document Reviewed: 12/31/2020 Elsevier Patient Education  2024 [employer].

## 2023-03-17 LAB — CERVICOVAGINAL ANCILLARY ONLY
Bacterial Vaginitis (gardnerella): POSITIVE — AB
Candida Glabrata: NEGATIVE
Candida Vaginitis: NEGATIVE
Chlamydia: NEGATIVE
Comment: NEGATIVE
Comment: NEGATIVE
Comment: NEGATIVE
Comment: NEGATIVE
Comment: NEGATIVE
Comment: NORMAL
Neisseria Gonorrhea: NEGATIVE
Trichomonas: NEGATIVE

## 2023-03-17 LAB — HSV 1 AND 2 AB, IGG
HSV 1 Glycoprotein G Ab, IgG: NONREACTIVE
HSV 2 IgG, Type Spec: REACTIVE — AB

## 2023-03-17 LAB — RPR: RPR Ser Ql: NONREACTIVE

## 2023-03-17 LAB — HIV ANTIBODY (ROUTINE TESTING W REFLEX): HIV Screen 4th Generation wRfx: NONREACTIVE

## 2023-03-20 ENCOUNTER — Ambulatory Visit: Payer: BC Managed Care – PPO | Admitting: Family Medicine

## 2023-03-20 MED ORDER — METRONIDAZOLE 500 MG PO TABS: 500.0000 mg | ORAL_TABLET | Freq: Two times a day (BID) | ORAL | 0 refills | Status: AC

## 2023-03-20 NOTE — Addendum Note (Signed)
Addended by: Roney Jaffe on: 03/20/2023 08:33 AM   Modules accepted: Orders

## 2023-03-30 ENCOUNTER — Telehealth: Payer: Self-pay | Admitting: Family Medicine

## 2023-03-30 NOTE — Telephone Encounter (Signed)
Called patient to reschedule missed appointment no answer left voicemail

## 2023-04-10 ENCOUNTER — Ambulatory Visit: Payer: BC Managed Care – PPO | Admitting: Gastroenterology

## 2023-04-28 ENCOUNTER — Ambulatory Visit (INDEPENDENT_AMBULATORY_CARE_PROVIDER_SITE_OTHER): Payer: BC Managed Care – PPO | Admitting: Gastroenterology

## 2023-04-28 ENCOUNTER — Encounter: Payer: Self-pay | Admitting: Gastroenterology

## 2023-04-28 VITALS — BP 102/80 | HR 78 | Ht 67.0 in | Wt 231.0 lb

## 2023-04-28 DIAGNOSIS — K625 Hemorrhage of anus and rectum: Secondary | ICD-10-CM | POA: Diagnosis not present

## 2023-04-28 NOTE — Patient Instructions (Signed)
If you are interested please contact our office at [phone] should you decide to have the procedure completed. You will be scheduled for a pre-visit and procedure at that time.    If your blood pressure at your visit was 140/90 or greater, please contact your primary care physician to follow up on this.  _______________________________________________________  If you are age 30 or older, your body mass index should be between 23-30. Your Body mass index is 36.18 kg/m. If this is out of the aforementioned range listed, please consider follow up with your Primary Care Provider.  If you are age 51 or younger, your body mass index should be between 19-25. Your Body mass index is 36.18 kg/m. If this is out of the aformentioned range listed, please consider follow up with your Primary Care Provider.   ________________________________________________________  The [facility] providers would like to encourage you to use [facility] to communicate with providers for non-urgent requests or questions.  Due to long hold times on the telephone, sending your provider a message by [facility] may be a faster and more efficient way to get a response.  Please allow 48 business hours for a response.  Please remember that this is for non-urgent requests.  _______________________________________________________    Thank you for entrusting me with your care and choosing [facility].  Bayley Leanna Sato, PA-C

## 2023-04-28 NOTE — Progress Notes (Signed)
Chief Complaint: rectal bleeding Primary GI MD: Gentry Fitz  HPI: 28 year old female with medical history as listed below presents for evaluation of rectal bleeding  Labs 02/16/2023 Hgb 11.7, MCV 79 Normal BMP  Discussed the use of AI scribe software for clinical note transcription with the patient, who gave verbal consent to proceed.  History of Present Illness   The patient, with a history of recent childbirth, presented with concerns about noticing a red streak in her stool twice over the past year, with the most recent occurrence within the last six months. The patient reported that she was constipated at the time of the most recent episode. She denied any family history of colon cancer, unintentional weight loss, abdominal pain, nausea, vomiting, or changes in bowel habits. The patient expressed a desire for further investigation to ensure there were no serious underlying conditions. She was considering undergoing a colonoscopy or flexible sigmoidoscopy for further evaluation.       Past Medical History:  Diagnosis Date   COVID 11/2019   fever x 2 days all symptoms resolved   Headache    Vitamin D deficiency     Past Surgical History:  Procedure Laterality Date   DILATION AND EVACUATION N/A 08/21/2020   Procedure: DILATATION AND EVACUATION;  Surgeon: Sherian Rein, MD;  Location: [facility];  Service: Gynecology;  Laterality: N/A;    Current Outpatient Medications  Medication Sig Dispense Refill   Prenat-FePoly-Metf-FA-DHA-DSS (VITAFOL FE+) 90-1-200 & 50 MG CPPK Take 1 tablet by mouth daily. (Patient not taking: Reported on 04/28/2023) 30 each 12   sertraline (ZOLOFT) 50 MG tablet Take 1 tablet (50 mg total) by mouth daily. (Patient not taking: Reported on 04/28/2023) 90 tablet 0   No current facility-administered medications for this visit.    Allergies as of 04/28/2023 - Review Complete 04/28/2023  Allergen Reaction Noted   Amoxicillin Rash  01/17/2019    Family History  Problem Relation Age of Onset   Healthy Mother    Breast cancer Maternal Grandmother    Diabetes Maternal Uncle    Colon cancer Neg Hx    Stomach cancer Neg Hx    Esophageal cancer Neg Hx     Social History   Socioeconomic History   Marital status: Married    Spouse name: Not on file   Number of children: 0   Years of education: 13   Highest education level: Bachelor's degree (e.g., BA, AB, BS)  Occupational History   Occupation: Bakery   Occupation: Hostess   Occupation: Statistician  Tobacco Use   Smoking status: Never   Smokeless tobacco: Never  Vaping Use   Vaping status: Never Used  Substance and Sexual Activity   Alcohol use: Not Currently   Drug use: Not Currently   Sexual activity: Yes    Partners: Male    Birth control/protection: None  Other Topics Concern   Not on file  Social History Narrative   ** Merged History Encounter **       Lives at home with mother. Right-handed. 2-3 cups caffeine per day.   Social Drivers of Corporate investment banker Strain: Low Risk  (02/28/2023)   Overall Financial Resource Strain (CARDIA)    Difficulty of Paying Living Expenses: Not very hard  Food Insecurity: No Food Insecurity (02/28/2023)   Hunger Vital Sign    Worried About Running Out of Food in the Last Year: Never true    Ran Out of Food in the Last Year: Never true  Transportation Needs: No Transportation Needs (02/28/2023)   PRAPARE - Administrator, Civil Service (Medical): No    Lack of Transportation (Non-Medical): No  Physical Activity: Sufficiently Active (02/28/2023)   Exercise Vital Sign    Days of Exercise per Week: 5 days    Minutes of Exercise per Session: 50 min  Stress: Stress Concern Present (02/28/2023)   [employer] of Occupational Health - Occupational Stress Questionnaire    Feeling of Stress : Rather much  Social Connections: Moderately Integrated (02/28/2023)   Social Connection and  Isolation Panel [NHANES]    Frequency of Communication with Friends and Family: Three times a week    Frequency of Social Gatherings with Friends and Family: Once a week    Attends Religious Services: More than 4 times per year    Active Member of [employer] or Organizations: No    Attends Engineer, structural: Not on file    Marital Status: Married  Catering manager Violence: Not on file    Review of Systems:    Constitutional: No weight loss, fever, chills, weakness or fatigue HEENT: Eyes: No change in vision               Ears, Nose, Throat:  No change in hearing or congestion Skin: No rash or itching Cardiovascular: No chest pain, chest pressure or palpitations   Respiratory: No SOB or cough Gastrointestinal: See HPI and otherwise negative Genitourinary: No dysuria or change in urinary frequency Neurological: No headache, dizziness or syncope Musculoskeletal: No new muscle or joint pain Hematologic: No bleeding or bruising Psychiatric: No history of depression or anxiety    Physical Exam:  Vital signs: BP 102/80   Pulse 78   Ht 5\' 7"  (1.702 m)   Wt 231 lb (104.8 kg)   BMI 36.18 kg/m   Constitutional: NAD, Well developed, Well nourished, alert and cooperative Head:  Normocephalic and atraumatic. Eyes:   PEERL, EOMI. No icterus. Conjunctiva pink. Respiratory: Respirations even and unlabored. Lungs clear to auscultation bilaterally.   No wheezes, crackles, or rhonchi.  Cardiovascular:  Regular rate and rhythm. No peripheral edema, cyanosis or pallor.  Gastrointestinal:  Soft, nondistended, nontender. No rebound or guarding. Normal bowel sounds. No appreciable masses or hepatomegaly. Rectal:  Not performed. Declined. Deferred to procedure Msk:  Symmetrical without gross deformities. Without edema, no deformity or joint abnormality.  Neurologic:  Alert and  oriented x4;  grossly normal neurologically.  Skin:   Dry and intact without significant lesions or  rashes. Psychiatric: Oriented to person, place and time. Demonstrates good judgement and reason without abnormal affect or behaviors.   RELEVANT LABS AND IMAGING: CBC    Component Value Date/Time   WBC 6.1 02/16/2023 1201   WBC 8.2 04/16/2022 1810   RBC 4.78 02/16/2023 1201   RBC 4.63 04/16/2022 1810   HGB 11.7 02/16/2023 1201   HCT 37.8 02/16/2023 1201   PLT 243 02/16/2023 1201   MCV 79 02/16/2023 1201   MCH 24.5 (L) 02/16/2023 1201   MCH 24.0 (L) 04/16/2022 1810   MCHC 31.0 (L) 02/16/2023 1201   MCHC 31.9 04/16/2022 1810   RDW 14.2 02/16/2023 1201   LYMPHSABS 1.5 02/25/2021 1118   MONOABS 0.6 02/06/2018 0439   EOSABS 0.1 02/25/2021 1118   BASOSABS 0.0 02/25/2021 1118    CMP     Component Value Date/Time   NA 138 02/16/2023 1201   K 4.4 02/16/2023 1201   CL 104 02/16/2023 1201   CO2 17 (  L) 02/16/2023 1201   GLUCOSE 82 02/16/2023 1201   GLUCOSE 96 04/02/2021 0845   BUN 14 02/16/2023 1201   CREATININE 0.77 02/16/2023 1201   CALCIUM 9.1 02/16/2023 1201   PROT 6.4 (L) 04/02/2021 0845   PROT 6.8 02/25/2021 1118   ALBUMIN 3.0 (L) 04/02/2021 0845   ALBUMIN 4.1 02/25/2021 1118   AST 18 04/02/2021 0845   ALT 21 04/02/2021 0845   ALKPHOS 45 04/02/2021 0845   BILITOT 0.2 (L) 04/02/2021 0845   BILITOT <0.2 02/25/2021 1118   GFRNONAA >60 04/02/2021 0845   GFRAA >60 02/06/2018 0439     Assessment/Plan:      Rectal Bleeding Two episodes of bright red blood in stool over the past year, associated with constipation and straining. Small bright red streak. Likely secondary to hemorrhoids or anal fissure. No family history of colon cancer, no unintentional weight loss, no abdominal pain, nausea, vomiting, or change in bowel habits. -- no red flag symptoms. Likely hemorrhoidal. Provided reassurance. Patient would like endoscopic evaluation and it makes her nervous. Discussed endoscopic options in detail including colonoscopy and flexible sigmoidoscopy. Patient declined  DRE/anoscopy today. --Consider colonoscopy or flexible sigmoidoscopy for further evaluation. Patient to decide and schedule if desired. She states she will call back on Monday. --Notify provider if bleeding recurs. Consider suppositories or cream for potential hemorrhoids.      Assigned to Dr. Barron Alvine today  Boone Master, PA-C [facility] 04/28/2023, 2:41 PM  Cc: Georganna Skeans, MD

## 2023-05-02 ENCOUNTER — Encounter: Payer: BC Managed Care – PPO | Admitting: Family Medicine

## 2023-05-03 ENCOUNTER — Ambulatory Visit (HOSPITAL_BASED_OUTPATIENT_CLINIC_OR_DEPARTMENT_OTHER): Payer: BC Managed Care – PPO | Admitting: Cardiology

## 2023-05-04 ENCOUNTER — Telehealth: Payer: BC Managed Care – PPO | Admitting: Family Medicine

## 2023-05-04 ENCOUNTER — Encounter: Payer: Self-pay | Admitting: Physician Assistant

## 2023-05-04 ENCOUNTER — Other Ambulatory Visit: Payer: Self-pay | Admitting: Physician Assistant

## 2023-05-04 DIAGNOSIS — N898 Other specified noninflammatory disorders of vagina: Secondary | ICD-10-CM

## 2023-05-04 DIAGNOSIS — Z91199 Patient's noncompliance with other medical treatment and regimen due to unspecified reason: Secondary | ICD-10-CM

## 2023-05-04 MED ORDER — VALACYCLOVIR HCL 500 MG PO TABS: 500.0000 mg | ORAL_TABLET | Freq: Two times a day (BID) | ORAL | 0 refills | Status: AC

## 2023-05-04 NOTE — Progress Notes (Signed)
The patient no-showed for appointment despite this provider sending direct link, reaching out via phone with no response and waiting for at least 10 minutes from appointment time for patient to join. They will be marked as a NS for this appointment/time.    M , NP    

## 2023-05-05 ENCOUNTER — Encounter: Payer: Self-pay | Admitting: Physician Assistant

## 2023-05-07 ENCOUNTER — Telehealth: Payer: BC Managed Care – PPO | Admitting: Family

## 2023-05-07 DIAGNOSIS — N898 Other specified noninflammatory disorders of vagina: Secondary | ICD-10-CM

## 2023-05-07 MED ORDER — FLUCONAZOLE 150 MG PO TABS: 150.0000 mg | ORAL_TABLET | ORAL | 0 refills | Status: DC | PRN

## 2023-05-07 NOTE — Progress Notes (Signed)
Virtual Visit Consent   Wendy Gutierrez, you are scheduled for a virtual visit with a [facility] provider today. Just as with appointments in the office, your consent must be obtained to participate. Your consent will be active for this visit and any virtual visit you may have with one of our providers in the next 365 days. If you have a MyChart account, a copy of this consent can be sent to you electronically.  As this is a virtual visit, video technology does not allow for your provider to perform a traditional examination. This may limit your provider's ability to fully assess your condition. If your provider identifies any concerns that need to be evaluated in person or the need to arrange testing (such as labs, EKG, etc.), we will make arrangements to do so. Although advances in technology are sophisticated, we cannot ensure that it will always work on either your end or our end. If the connection with a video visit is poor, the visit may have to be switched to a telephone visit. With either a video or telephone visit, we are not always able to ensure that we have a secure connection.  By engaging in this virtual visit, you consent to the provision of healthcare and authorize for your insurance to be billed (if applicable) for the services provided during this visit. Depending on your insurance coverage, you may receive a charge related to this service.  I need to obtain your verbal consent now. Are you willing to proceed with your visit today? Wendy Gutierrez has provided verbal consent on 05/07/2023 for a virtual visit (video or telephone). Wendy Rodney, FNP  Date: 05/07/2023 6:13 PM  Virtual Visit via Video Note   I, Wendy Gutierrez, connected with  Wendy Gutierrez  ([id number], 10/10/1995) on 05/07/23 at  6:45 PM EST by a video-enabled telemedicine application and verified that I am speaking with the correct person using two identifiers.  Location: Patient: Virtual Visit Location Patient:  Home Provider: Virtual Visit Location Provider: Home Office   I discussed the limitations of evaluation and management by telemedicine and the availability of in person appointments. The patient expressed understanding and agreed to proceed.    History of Present Illness: Wendy Gutierrez is a 28 y.o. who identifies as a female who was assigned female at birth, and is being seen today for vaginal discharge and odor that started three days ago.  HPI: Vaginal Discharge The patient's primary symptoms include a genital odor and vaginal discharge. The patient's pertinent negatives include no genital itching. This is a new problem. The pain is mild. Pertinent negatives include no constipation, diarrhea, dysuria, fever, flank pain or frequency. The vaginal discharge was white. The treatment provided mild relief.    Problems:  Patient Active Problem List   Diagnosis Date Noted   Alpha thalassemia silent carrier 03/24/2021   Arthropathy of lumbar facet joint 07/09/2019   Low grade squamous intraepithelial lesion (LGSIL) on cervicovaginal cytologic smear 10/10/2016   Pregnancy 09/29/2016   Migraine without aura and without status migrainosus, not intractable 07/24/2014    Allergies:  Allergies  Allergen Reactions   Amoxicillin Rash   Medications:  Current Outpatient Medications:    fluconazole (DIFLUCAN) 150 MG tablet, Take 1 tablet (150 mg total) by mouth every three (3) days as needed., Disp: 2 tablet, Rfl: 0   Prenat-FePoly-Metf-FA-DHA-DSS (VITAFOL FE+) 90-1-200 & 50 MG CPPK, Take 1 tablet by mouth daily. (Patient not taking: Reported on 04/28/2023), Disp: 30 each, Rfl: 12   sertraline (  ZOLOFT) 50 MG tablet, Take 1 tablet (50 mg total) by mouth daily. (Patient not taking: Reported on 04/28/2023), Disp: 90 tablet, Rfl: 0   valACYclovir (VALTREX) 500 MG tablet, Take 1 tablet (500 mg total) by mouth 2 (two) times daily for 3 days., Disp: 6 tablet, Rfl: 0  Observations/Objective: Patient is  well-developed, well-nourished in no acute distress.  Resting comfortably  at home.  Head is normocephalic, atraumatic.  No labored breathing.  Speech is clear and coherent with logical content.  Patient is alert and oriented at baseline.    Assessment and Plan: 1. Vaginal discharge (Primary) - fluconazole (DIFLUCAN) 150 MG tablet; Take 1 tablet (150 mg total) by mouth every three (3) days as needed.  Dispense: 2 tablet; Refill: 0  Keep clean and dry Avoid scratching  Probiotic as needed Follow up if symptoms worsen or do not improve   Follow Up Instructions: I discussed the assessment and treatment plan with the patient. The patient was provided an opportunity to ask questions and all were answered. The patient agreed with the plan and demonstrated an understanding of the instructions.  A copy of instructions were sent to the patient via MyChart unless otherwise noted below.     The patient was advised to call back or seek an in-person evaluation if the symptoms worsen or if the condition fails to improve as anticipated.    Wendy Rodney, FNP

## 2023-05-07 NOTE — Progress Notes (Signed)
Agree with the assessment and plan as outlined by Boone Master, PA-C.  Nimrat Woolworth, DO, [facility]

## 2023-05-23 ENCOUNTER — Encounter: Payer: BC Managed Care – PPO | Admitting: Family Medicine

## 2023-05-23 ENCOUNTER — Inpatient Hospital Stay (HOSPITAL_COMMUNITY): Payer: BC Managed Care – PPO

## 2023-05-23 ENCOUNTER — Inpatient Hospital Stay (HOSPITAL_COMMUNITY)
Admission: AD | Admit: 2023-05-23 | Discharge: 2023-05-23 | Disposition: A | Discharge status: Home / Self Care | Payer: BC Managed Care – PPO | Attending: Obstetrics and Gynecology | Admitting: Obstetrics and Gynecology

## 2023-05-23 ENCOUNTER — Other Ambulatory Visit: Payer: Self-pay

## 2023-05-23 ENCOUNTER — Encounter (HOSPITAL_COMMUNITY): Payer: Self-pay | Admitting: Obstetrics and Gynecology

## 2023-05-23 DIAGNOSIS — O034 Incomplete spontaneous abortion without complication: Secondary | ICD-10-CM

## 2023-05-23 DIAGNOSIS — J101 Influenza due to other identified influenza virus with other respiratory manifestations: Secondary | ICD-10-CM | POA: Diagnosis not present

## 2023-05-23 DIAGNOSIS — B349 Viral infection, unspecified: Secondary | ICD-10-CM

## 2023-05-23 DIAGNOSIS — O98511 Other viral diseases complicating pregnancy, first trimester: Secondary | ICD-10-CM | POA: Insufficient documentation

## 2023-05-23 DIAGNOSIS — Z3A01 Less than 8 weeks gestation of pregnancy: Secondary | ICD-10-CM | POA: Diagnosis not present

## 2023-05-23 LAB — RESP PANEL BY RT-PCR (RSV, FLU A&B, COVID)  RVPGX2
Influenza A by PCR: POSITIVE — AB
Influenza B by PCR: NEGATIVE
Resp Syncytial Virus by PCR: NEGATIVE
SARS Coronavirus 2 by RT PCR: NEGATIVE

## 2023-05-23 LAB — CBC
HCT: 37.3 % (ref 36.0–46.0)
Hemoglobin: 11.8 g/dL — ABNORMAL LOW (ref 12.0–15.0)
MCH: 24.5 pg — ABNORMAL LOW (ref 26.0–34.0)
MCHC: 31.6 g/dL (ref 30.0–36.0)
MCV: 77.5 fL — ABNORMAL LOW (ref 80.0–100.0)
Platelets: 205 10*3/uL (ref 150–400)
RBC: 4.81 MIL/uL (ref 3.87–5.11)
RDW: 15.9 % — ABNORMAL HIGH (ref 11.5–15.5)
WBC: 5.2 10*3/uL (ref 4.0–10.5)
nRBC: 0 % (ref 0.0–0.2)

## 2023-05-23 LAB — URINALYSIS, ROUTINE W REFLEX MICROSCOPIC
Bilirubin Urine: NEGATIVE
Glucose, UA: NEGATIVE mg/dL
Ketones, ur: NEGATIVE mg/dL
Nitrite: NEGATIVE
Protein, ur: NEGATIVE mg/dL
Specific Gravity, Urine: 1.018 (ref 1.005–1.030)
pH: 5 (ref 5.0–8.0)

## 2023-05-23 LAB — POCT PREGNANCY, URINE: Preg Test, Ur: POSITIVE — AB

## 2023-05-23 LAB — HCG, QUANTITATIVE, PREGNANCY: hCG, Beta Chain, Quant, S: 214 m[IU]/mL — ABNORMAL HIGH (ref <5)

## 2023-05-23 MED ORDER — MISOPROSTOL 200 MCG PO TABS
800.0000 ug | ORAL_TABLET | Freq: Once | ORAL | Status: AC
  Administered 2023-05-23: 800 ug via BUCCAL
  Filled 2023-05-23: qty 4

## 2023-05-23 MED ORDER — OSELTAMIVIR PHOSPHATE 75 MG PO CAPS: 75.0000 mg | ORAL_CAPSULE | Freq: Two times a day (BID) | ORAL | 0 refills | Status: AC

## 2023-05-23 NOTE — MAU Note (Signed)
Wendy Gutierrez is a 28 y.o. at Unknown here in MAU reporting: she took pills last Tuesday for an abortion, states she's unsure if the pregnancy has been completely aborted.  States she's having abdominal cramping and spotting, cramping began yesterday and spotting since Tuesday.  States continues to have breast tenderness and nausea.  Repports last took Ibuprofen last night @ 2100  LMP: 03/30/2023 Onset of complaint: ongoing Pain score: 8 Vitals:   05/23/23 1025  BP: 132/85  Pulse: (!) 106  Resp: 18  Temp: (!) 100.7 F (38.2 C)  SpO2: 99%     FHT: NA  Lab orders placed from triage: None, will ask provider

## 2023-05-23 NOTE — MAU Provider Note (Signed)
Chief Complaint: Abdominal Pain and Vaginal Bleeding   Event Date/Time   First Provider Initiated Contact with Patient 05/23/23 1046      SUBJECTIVE HPI: Wendy Gutierrez is a 28 y.o. [id number] at 1 week s/p medical AB who presents to maternity admissions reporting continued bleeding, cramping after AB.  Patient notes taking medications for abortion 2/4, 2/5 (presumably mifepristone and misoprostol per description). Notes being [redacted]w[redacted]d pregnant. Has only had light spotting and some cramping since then. No bleeding like a period or any clots. Continues to have breast tenderness and nausea. Yesterday began to feel chilled and overall feeling poorly. Came in for evaluation today for same. Negative COVID test at home. Starting to have cough/congestion.  HPI  Past Medical History:  Diagnosis Date   COVID 11/2019   fever x 2 days all symptoms resolved   Headache    Vitamin D deficiency    Past Surgical History:  Procedure Laterality Date   DILATION AND EVACUATION N/A 08/21/2020   Procedure: DILATATION AND EVACUATION;  Surgeon: Sherian Rein, MD;  Location: [facility];  Service: Gynecology;  Laterality: N/A;   Social History   Socioeconomic History   Marital status: Married    Spouse name: Not on file   Number of children: 0   Years of education: 13   Highest education level: Bachelor's degree (e.g., BA, AB, BS)  Occupational History   Occupation: Bakery   Occupation: Hostess   Occupation: Statistician  Tobacco Use   Smoking status: Never   Smokeless tobacco: Never  Vaping Use   Vaping status: Never Used  Substance and Sexual Activity   Alcohol use: Not Currently   Drug use: Not Currently   Sexual activity: Not Currently    Partners: Male    Birth control/protection: None  Other Topics Concern   Not on file  Social History Narrative   ** Merged History Encounter **       Lives at home with mother. Right-handed. 2-3 cups caffeine per day.   Social Drivers  of Corporate investment banker Strain: Low Risk  (02/28/2023)   Overall Financial Resource Strain (CARDIA)    Difficulty of Paying Living Expenses: Not very hard  Food Insecurity: No Food Insecurity (02/28/2023)   Hunger Vital Sign    Worried About Running Out of Food in the Last Year: Never true    Ran Out of Food in the Last Year: Never true  Transportation Needs: No Transportation Needs (02/28/2023)   PRAPARE - Administrator, Civil Service (Medical): No    Lack of Transportation (Non-Medical): No  Physical Activity: Sufficiently Active (02/28/2023)   Exercise Vital Sign    Days of Exercise per Week: 5 days    Minutes of Exercise per Session: 50 min  Stress: Stress Concern Present (02/28/2023)   [employer] of Occupational Health - Occupational Stress Questionnaire    Feeling of Stress : Rather much  Social Connections: Moderately Integrated (02/28/2023)   Social Connection and Isolation Panel [NHANES]    Frequency of Communication with Friends and Family: Three times a week    Frequency of Social Gatherings with Friends and Family: Once a week    Attends Religious Services: More than 4 times per year    Active Member of [employer] or Organizations: No    Attends Engineer, structural: Not on file    Marital Status: Married  Catering manager Violence: Not on file   No current facility-administered medications on file prior  to encounter.   Current Outpatient Medications on File Prior to Encounter  Medication Sig Dispense Refill   ibuprofen (ADVIL) 200 MG tablet Take 200 mg by mouth every 6 (six) hours as needed.     fluconazole (DIFLUCAN) 150 MG tablet Take 1 tablet (150 mg total) by mouth every three (3) days as needed. 2 tablet 0   Prenat-FePoly-Metf-FA-DHA-DSS (VITAFOL FE+) 90-1-200 & 50 MG CPPK Take 1 tablet by mouth daily. (Patient not taking: Reported on 04/28/2023) 30 each 12   sertraline (ZOLOFT) 50 MG tablet Take 1 tablet (50 mg total) by mouth  daily. (Patient not taking: Reported on 04/28/2023) 90 tablet 0   Allergies  Allergen Reactions   Amoxicillin Rash    ROS:  Pertinent positives/negatives listed above.  I have reviewed patient's Past Medical Hx, Surgical Hx, Family Hx, Social Hx, medications and allergies.   Physical Exam  Patient Vitals for the past 24 hrs:  BP Temp Temp src Pulse Resp SpO2 Height Weight  05/23/23 1025 132/85 (!) 100.7 F (38.2 C) Oral (!) 106 18 99 % -- --  05/23/23 1018 -- -- -- -- -- -- 5\' 8"  (1.727 m) 107 kg   Constitutional: Well-developed, well-nourished female in no acute distress. Appears mildly ill  Cardiovascular: mild tachycardia Respiratory: normal effort GI: Abd soft, non-tender. Warm to touch lower quadrants/pelvis MS: Extremities nontender, no edema, normal ROM Neurologic: Alert and oriented x 4  GU: Neg CVAT  LAB RESULTS Results for orders placed or performed during the hospital encounter of 05/23/23 (from the past 24 hours)  Urinalysis, Routine w reflex microscopic -Urine, Clean Catch     Status: Abnormal   Collection Time: 05/23/23 10:33 AM  Result Value Ref Range   Color, Urine YELLOW YELLOW   APPearance HAZY (A) CLEAR   Specific Gravity, Urine 1.018 1.005 - 1.030   pH 5.0 5.0 - 8.0   Glucose, UA NEGATIVE NEGATIVE mg/dL   Hgb urine dipstick LARGE (A) NEGATIVE   Bilirubin Urine NEGATIVE NEGATIVE   Ketones, ur NEGATIVE NEGATIVE mg/dL   Protein, ur NEGATIVE NEGATIVE mg/dL   Nitrite NEGATIVE NEGATIVE   Leukocytes,Ua TRACE (A) NEGATIVE   RBC / HPF 11-20 0 - 5 RBC/hpf   WBC, UA 11-20 0 - 5 WBC/hpf   Bacteria, UA FEW (A) NONE SEEN   Squamous Epithelial / HPF 11-20 0 - 5 /HPF   Mucus PRESENT   Pregnancy, urine POC     Status: Abnormal   Collection Time: 05/23/23 10:35 AM  Result Value Ref Range   Preg Test, Ur POSITIVE (A) NEGATIVE  CBC     Status: Abnormal   Collection Time: 05/23/23 11:12 AM  Result Value Ref Range   WBC 5.2 4.0 - 10.5 K/uL   RBC 4.81 3.87 - 5.11  MIL/uL   Hemoglobin 11.8 (L) 12.0 - 15.0 g/dL   HCT 19.1 47.8 - 29.5 %   MCV 77.5 (L) 80.0 - 100.0 fL   MCH 24.5 (L) 26.0 - 34.0 pg   MCHC 31.6 30.0 - 36.0 g/dL   RDW 62.1 (H) 30.8 - 65.7 %   Platelets 205 150 - 400 K/uL   nRBC 0.0 0.0 - 0.2 %      IMAGING No results found.  Ultrasound reviewed with Dr. Alvester Morin. Shows possible small clot vs retained POC and mildly thickened endometrial lining.  MAU Management/MDM: Orders Placed This Encounter  Procedures   Resp panel by RT-PCR (RSV, Flu A&B, Covid) Anterior Nasal Swab   US OB  LESS THAN 14 WEEKS WITH OB TRANSVAGINAL   Urinalysis, Routine w reflex microscopic -Urine, Clean Catch   CBC   hCG, quantitative, pregnancy   Pregnancy, urine POC   Discharge patient    Meds ordered this encounter  Medications   misoprostol (CYTOTEC) tablet 800 mcg   oseltamivir (TAMIFLU) 75 MG capsule    Sig: Take 1 capsule (75 mg total) by mouth 2 (two) times daily for 5 days.    Dispense:  10 capsule    Refill:  0    Patient presents for continued bleeding and general malaise after medical abortion 2/4 at [redacted]w[redacted]d per patient report. Does have a fever upon arrival. Is hemodynamically stable. Findings are concerning for septic ab, however also does appear to possibly have a viral illness. I do have concern for retained POC given patient has not bled more than spotting for past week. Will obtain CBC, quant, UA, respiratory swab, TVUS for further evaluation. Patient declines any symptomatic treatment at this time.  Results returned. Reassuringly WBC is wnl which makes septic AB incredibly unlikely at this time. Will treat with cytotec 800 mcg buccal prior to discharge from MAU for clot vs retained POC. Respiratory swab pending. Tamiflu sent in case she returns flu positive. Has follow-up appointment in 2 weeks which I encouraged her to keep. All questions answered to patient's satisfaction.  ASSESSMENT 1. Incomplete abortion   2. Acute viral syndrome      PLAN Discharge home with strict return precautions. Allergies as of 05/23/2023       Reactions   Amoxicillin Rash        Medication List     STOP taking these medications    sertraline 50 MG tablet Commonly known as: Zoloft   Vitafol FE+ 90-1-200 & 50 MG Cppk       TAKE these medications    fluconazole 150 MG tablet Commonly known as: DIFLUCAN Take 1 tablet (150 mg total) by mouth every three (3) days as needed.   ibuprofen 200 MG tablet Commonly known as: ADVIL Take 200 mg by mouth every 6 (six) hours as needed.   oseltamivir 75 MG capsule Commonly known as: Tamiflu Take 1 capsule (75 mg total) by mouth 2 (two) times daily for 5 days.         Wylene Simmer, MD OB Fellow 05/23/2023  11:44 AM

## 2023-06-09 ENCOUNTER — Ambulatory Visit (INDEPENDENT_AMBULATORY_CARE_PROVIDER_SITE_OTHER): Payer: BC Managed Care – PPO | Admitting: Family Medicine

## 2023-06-09 VITALS — BP 109/75 | HR 67 | Temp 98.4°F | Resp 18 | Ht 68.0 in | Wt 232.4 lb

## 2023-06-09 DIAGNOSIS — Z130 Encounter for screening for diseases of the blood and blood-forming organs and certain disorders involving the immune mechanism: Secondary | ICD-10-CM

## 2023-06-09 DIAGNOSIS — Z0000 Encounter for general adult medical examination without abnormal findings: Secondary | ICD-10-CM

## 2023-06-09 DIAGNOSIS — Z13220 Encounter for screening for lipoid disorders: Secondary | ICD-10-CM | POA: Diagnosis not present

## 2023-06-10 LAB — CMP14+EGFR
ALT: 19 IU/L (ref 0–32)
AST: 19 IU/L (ref 0–40)
Albumin: 4.2 g/dL (ref 4.0–5.0)
Alkaline Phosphatase: 70 IU/L (ref 44–121)
BUN/Creatinine Ratio: 16 (ref 9–23)
BUN: 13 mg/dL (ref 6–20)
Bilirubin Total: 0.4 mg/dL (ref 0.0–1.2)
CO2: 21 mmol/L (ref 20–29)
Calcium: 9.2 mg/dL (ref 8.7–10.2)
Chloride: 104 mmol/L (ref 96–106)
Creatinine, Ser: 0.8 mg/dL (ref 0.57–1.00)
Globulin, Total: 3 g/dL (ref 1.5–4.5)
Glucose: 82 mg/dL (ref 70–99)
Potassium: 4.5 mmol/L (ref 3.5–5.2)
Sodium: 138 mmol/L (ref 134–144)
Total Protein: 7.2 g/dL (ref 6.0–8.5)
eGFR: 102 mL/min/{1.73_m2} (ref >59)

## 2023-06-10 LAB — CBC WITH DIFFERENTIAL/PLATELET
Basophils Absolute: 0 10*3/uL (ref 0.0–0.2)
Basos: 1 %
EOS (ABSOLUTE): 0.1 10*3/uL (ref 0.0–0.4)
Eos: 1 %
Hematocrit: 39.4 % (ref 34.0–46.6)
Hemoglobin: 11.9 g/dL (ref 11.1–15.9)
Immature Grans (Abs): 0 10*3/uL (ref 0.0–0.1)
Immature Granulocytes: 0 %
Lymphocytes Absolute: 1.9 10*3/uL (ref 0.7–3.1)
Lymphs: 30 %
MCH: 23.9 pg — ABNORMAL LOW (ref 26.6–33.0)
MCHC: 30.2 g/dL — ABNORMAL LOW (ref 31.5–35.7)
MCV: 79 fL (ref 79–97)
Monocytes Absolute: 0.4 10*3/uL (ref 0.1–0.9)
Monocytes: 6 %
Neutrophils Absolute: 4 10*3/uL (ref 1.4–7.0)
Neutrophils: 62 %
Platelets: 352 10*3/uL (ref 150–450)
RBC: 4.97 x10E6/uL (ref 3.77–5.28)
RDW: 14.9 % (ref 11.7–15.4)
WBC: 6.4 10*3/uL (ref 3.4–10.8)

## 2023-06-10 LAB — LIPID PANEL
Chol/HDL Ratio: 3.7 ratio (ref 0.0–4.4)
Cholesterol, Total: 171 mg/dL (ref 100–199)
HDL: 46 mg/dL (ref >39)
LDL Chol Calc (NIH): 115 mg/dL — ABNORMAL HIGH (ref 0–99)
Triglycerides: 49 mg/dL (ref 0–149)
VLDL Cholesterol Cal: 10 mg/dL (ref 5–40)

## 2023-06-12 ENCOUNTER — Encounter: Payer: Self-pay | Admitting: Family Medicine

## 2023-06-12 NOTE — Progress Notes (Signed)
 Established Patient Office Visit  Subjective    Patient ID: Wendy Gutierrez, female    DOB: 01-17-1996  Age: 28 y.o. MRN: [id number]  CC:  Chief Complaint  Patient presents with  . Annual Exam    HPI Wendy Gutierrez presents for routine annual exam. Patient denies acute complaints or concerns.   Outpatient Encounter Medications as of 06/09/2023  Medication Sig  . fluconazole (DIFLUCAN) 150 MG tablet Take 1 tablet (150 mg total) by mouth every three (3) days as needed. (Patient not taking: Reported on 06/09/2023)  . ibuprofen (ADVIL) 200 MG tablet Take 200 mg by mouth every 6 (six) hours as needed. (Patient not taking: Reported on 06/09/2023)   No facility-administered encounter medications on file as of 06/09/2023.    Past Medical History:  Diagnosis Date  . COVID 11/2019   fever x 2 days all symptoms resolved  . Headache   . Vitamin D deficiency     Past Surgical History:  Procedure Laterality Date  . DILATION AND EVACUATION N/A 08/21/2020   Procedure: DILATATION AND EVACUATION;  Surgeon: Sherian Rein, MD;  Location: [facility];  Service: Gynecology;  Laterality: N/A;    Family History  Problem Relation Age of Onset  . Healthy Mother   . Breast cancer Maternal Grandmother   . Diabetes Maternal Uncle   . Colon cancer Neg Hx   . Stomach cancer Neg Hx   . Esophageal cancer Neg Hx     Social History   Socioeconomic History  . Marital status: Married    Spouse name: Not on file  . Number of children: 0  . Years of education: 58  . Highest education level: Bachelor's degree (e.g., BA, AB, BS)  Occupational History  . Occupation: Brewing technologist  . Occupation: Hostess  . Occupation: [employer]  Tobacco Use  . Smoking status: Never  . Smokeless tobacco: Never  Vaping Use  . Vaping status: Never Used  Substance and Sexual Activity  . Alcohol use: Not Currently  . Drug use: Not Currently  . Sexual activity: Not Currently    Partners: Male     Birth control/protection: None  Other Topics Concern  . Not on file  Social History Narrative   ** Merged History Encounter **       Lives at home with mother. Right-handed. 2-3 cups caffeine per day.   Social Drivers of Health   Financial Resource Strain: Low Risk  (06/09/2023)   Overall Financial Resource Strain (CARDIA)   . Difficulty of Paying Living Expenses: Not very hard  Food Insecurity: No Food Insecurity (06/09/2023)   Hunger Vital Sign   . Worried About Programme researcher, broadcasting/film/video in the Last Year: Never true   . Ran Out of Food in the Last Year: Never true  Transportation Needs: No Transportation Needs (06/09/2023)   PRAPARE - Transportation   . Lack of Transportation (Medical): No   . Lack of Transportation (Non-Medical): No  Physical Activity: Insufficiently Active (06/09/2023)   Exercise Vital Sign   . Days of Exercise per Week: 2 days   . Minutes of Exercise per Session: 20 min  Stress: No Stress Concern Present (06/09/2023)   [employer] of Occupational Health - Occupational Stress Questionnaire   . Feeling of Stress : Not at all  Social Connections: Moderately Integrated (06/09/2023)   Social Connection and Isolation Panel [NHANES]   . Frequency of Communication with Friends and Family: More than three times a week   . Frequency of  Social Gatherings with Friends and Family: Once a week   . Attends Religious Services: More than 4 times per year   . Active Member of Clubs or Organizations: No   . Attends Banker Meetings: Not on file   . Marital Status: Married  Catering manager Violence: Not on file    Review of Systems  All other systems reviewed and are negative.       Objective    BP 109/75   Pulse 67   Temp 98.4 F (36.9 C) (Oral)   Resp 18   Ht 5\' 8"  (1.727 m)   Wt 232 lb 6.4 oz (105.4 kg)   LMP 03/27/2023   SpO2 97%   BMI 35.34 kg/m   Physical Exam Vitals and nursing note reviewed.  Constitutional:      General: She is not  in acute distress. HENT:     Head: Normocephalic and atraumatic.     Right Ear: Tympanic membrane, ear canal and external ear normal.     Left Ear: Tympanic membrane, ear canal and external ear normal.     Nose: Nose normal.     Mouth/Throat:     Mouth: Mucous membranes are moist.     Pharynx: Oropharynx is clear.  Eyes:     Conjunctiva/sclera: Conjunctivae normal.     Pupils: Pupils are equal, round, and reactive to light.  Neck:     Thyroid: No thyromegaly.  Cardiovascular:     Rate and Rhythm: Normal rate and regular rhythm.     Heart sounds: Normal heart sounds. No murmur heard. Pulmonary:     Effort: Pulmonary effort is normal. No respiratory distress.     Breath sounds: Normal breath sounds.  Abdominal:     General: There is no distension.     Palpations: Abdomen is soft. There is no mass.     Tenderness: There is no abdominal tenderness.  Musculoskeletal:        General: Normal range of motion.     Cervical back: Normal range of motion and neck supple.  Skin:    General: Skin is warm and dry.  Neurological:     General: No focal deficit present.     Mental Status: She is alert and oriented to person, place, and time.  Psychiatric:        Mood and Affect: Mood normal.        Behavior: Behavior normal.        Assessment & Plan:   Annual physical exam -     CMP14+EGFR  Screening for lipid disorders -     Lipid panel  Screening for deficiency anemia -     CBC with Differential/Platelet     No follow-ups on file.   [name], MD

## 2023-07-08 ENCOUNTER — Encounter: Payer: Self-pay | Admitting: *Deleted

## 2023-07-08 ENCOUNTER — Ambulatory Visit
Admission: EM | Admit: 2023-07-08 | Discharge: 2023-07-08 | Disposition: A | Discharge status: Home / Self Care | Attending: Internal Medicine | Admitting: Internal Medicine

## 2023-07-08 DIAGNOSIS — R0789 Other chest pain: Secondary | ICD-10-CM

## 2023-07-08 MED ORDER — METHOCARBAMOL 500 MG PO TABS: 500.0000 mg | ORAL_TABLET | Freq: Two times a day (BID) | ORAL | 0 refills | Status: DC

## 2023-07-08 MED ORDER — PREDNISONE 20 MG PO TABS: 40.0000 mg | ORAL_TABLET | Freq: Every day | ORAL | 0 refills | Status: AC

## 2023-07-08 NOTE — ED Triage Notes (Signed)
 States started with very intermittent chest pains over the past few months, but recently has been occurring more frequently. 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 States she is now occasionally getting sharp pains in LUE. States at present left upper arm is painful and c/o left upper chest "achiness". Denies SOB, n/v. No change in pain with deep breathing or movement.

## 2023-07-08 NOTE — ED Provider Notes (Signed)
 [facility]    CSN: [id number] Arrival date & time: 07/08/23  1334      History   Chief Complaint Chief Complaint  Patient presents with   Chest Pain    HPI Wendy Gutierrez is a 28 y.o. female.   28 year old female who presents urgent care with complaints of left sided chest pain and left arm pain.  This has been going on for 2 to 3 weeks.  She reports that it is not constant and comes and goes.  Sometimes it is more significant than others.  In the last several days it has been more frequent and a little bit more prolonged.  She denies any shortness of breath, cough, fevers, palpitations.  She reports that it does tend to be positional in nature.  She has recently changed positions at work and is now doing a lot more heavy lifting.  She has talked to her primary care doctor about this and is scheduled to see cardiology as well.    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/09/2019   Low grade squamous intraepithelial lesion (LGSIL) on cervicovaginal cytologic smear 10/10/2016   Pregnancy 09/29/2016   Migraine without aura and without status migrainosus, not intractable 07/24/2014    Past Surgical History:  Procedure Laterality Date   DILATION AND EVACUATION N/A 08/21/2020   Procedure: DILATATION AND EVACUATION;  Surgeon: Sherian Rein, MD;  Location: [facility];  Service: Gynecology;  Laterality: N/A;    OB History     Gravida  5   Para  3   Term  3   Preterm  0   AB  1   Living  3      SAB  0   IAB  1   Ectopic  0   Multiple  0   Live Births   3            Home Medications    Prior to Admission medications   Medication Sig Start Date End Date Taking? Authorizing Provider  metroNIDAZOLE (FLAGYL) 500 MG tablet Take 500 mg by mouth as directed. 07/06/23 07/13/23 Yes [provider]  fluconazole (DIFLUCAN) 150 MG tablet Take 1 tablet (150 mg total) by mouth every three (3) days as needed. Patient not taking: Reported on 06/09/2023 05/07/23   Jannifer Rodney A, FNP  ibuprofen (ADVIL) 200 MG tablet Take 200 mg by mouth every 6 (six) hours as needed. Patient not taking: Reported on 06/09/2023    [provider]  norgestimate-ethinyl estradiol (SPRINTEC 28) 0.25-35 MG-MCG tablet Take 1 tablet by mouth daily.    [provider]    Family History Family History  Problem Relation Age of Onset   Healthy Mother    Breast cancer Maternal Grandmother    Diabetes Maternal Uncle    Colon cancer Neg Hx    Stomach cancer Neg Hx    Esophageal cancer Neg Hx     Social History Social History   Tobacco Use   Smoking status: Never   Smokeless tobacco: Never  Vaping  Use   Vaping status: Never Used  Substance Use Topics   Alcohol use: Not Currently   Drug use: Not Currently     Allergies   Amoxicillin   Review of Systems Review of Systems  Constitutional:  Negative for chills and fever.  HENT:  Negative for ear pain and sore throat.   Eyes:  Negative for pain and visual disturbance.  Respiratory:  Negative for cough, chest tightness and shortness of breath.   Cardiovascular:  Positive for chest pain. Negative for palpitations.  Gastrointestinal:  Negative for abdominal pain and vomiting.  Genitourinary:  Negative for dysuria and hematuria.  Musculoskeletal:  Negative for arthralgias and back pain.       Left arm pain  Skin:  Negative for color change and rash.  Neurological:  Negative for seizures and syncope.  All other systems reviewed and are negative.    Physical Exam Triage Vital Signs ED  Triage Vitals  Encounter Vitals Group     BP 07/08/23 1342 120/75     Systolic BP Percentile --      Diastolic BP Percentile --      Pulse Rate 07/08/23 1342 88     Resp 07/08/23 1342 16     Temp 07/08/23 1342 98.4 F (36.9 C)     Temp Source 07/08/23 1342 Oral     SpO2 07/08/23 1342 97 %     Weight --      Height --      Head Circumference --      Peak Flow --      Pain Score 07/08/23 1343 8     Pain Loc --      Pain Education --      Exclude from Growth Chart --    No data found.  Updated Vital Signs BP 120/75   Pulse 88   Temp 98.4 F (36.9 C) (Oral)   Resp 16   LMP 06/30/2023 (Exact Date)   SpO2 97%   Breastfeeding No   Visual Acuity Right Eye Distance:   Left Eye Distance:   Bilateral Distance:    Right Eye Near:   Left Eye Near:    Bilateral Near:     Physical Exam Vitals and nursing note reviewed.  Constitutional:      General: She is not in acute distress.    Appearance: She is well-developed.  HENT:     Head: Normocephalic and atraumatic.  Eyes:     Conjunctiva/sclera: Conjunctivae normal.  Cardiovascular:     Rate and Rhythm: Normal rate and regular rhythm.     Heart sounds: No murmur heard. Pulmonary:     Effort: Pulmonary effort is normal. No respiratory distress.     Breath sounds: Normal breath sounds.  Chest:    Abdominal:     Palpations: Abdomen is soft.     Tenderness: There is no abdominal tenderness.  Musculoskeletal:        General: No swelling.     Cervical back: Neck supple.  Skin:    General: Skin is warm and dry.     Capillary Refill: Capillary refill takes less than 2 seconds.  Neurological:     Mental Status: She is alert.  Psychiatric:        Mood and Affect: Mood normal.      UC Treatments / Results  Labs (all labs ordered are listed, but only abnormal results are displayed) Labs Reviewed - No data to display  EKG   Radiology No  results found.  Procedures Procedures (including critical care  time)  Medications Ordered in UC Medications - No data to display  Initial Impression / Assessment and Plan / UC Course  I have reviewed the triage vital signs and the nursing notes.  Pertinent labs & imaging results that were available during my care of the patient were reviewed by me and considered in my medical decision making (see chart for details).     Musculoskeletal chest pain   Chest pain symptoms are most consistent with a musculoskeletal source.  The pain is reproducible with movement of the arm and palpitation on the chest.  EKG done today was normal.  Although we cannot 100% rule out an arrhythmia, it is reassuring that the EKG is normal and the symptoms are reproducible with movement.  Would keep the cardiology appointment as scheduled.  We will treat with the following: Prednisone 40 mg (2 tablets) once daily for 5 days. Take this in the morning.  This is a steroid to help with inflammation and pain.  Robaxin 500 mg every 12 hours as needed for muscle spasms.  Use caution as this medication can cause drowsiness. Light stretching.  May use moist heat on the area to help with pain. If symptoms worsen despite treatment or become more constant then recommend going to the emergency room for further evaluation May return to urgent care as needed.  Final Clinical Impressions(s) / UC Diagnoses   Final diagnoses:  None   Discharge Instructions   None    ED Prescriptions   None    PDMP not reviewed this encounter.   Landis Martins, [place] 07/08/23 [phone]

## 2023-07-08 NOTE — Discharge Instructions (Addendum)
 Chest pain symptoms are most consistent with a musculoskeletal source.  The pain is reproducible with movement of the arm and palpitation on the chest.  EKG done today was normal.  Although we cannot 100% rule out an arrhythmia, it is reassuring that the EKG is normal and the symptoms are reproducible with movement.  Would keep the cardiology appointment as scheduled.  We will treat with the following: Prednisone 40 mg (2 tablets) once daily for 5 days. Take this in the morning.  This is a steroid to help with inflammation and pain.  Robaxin 500 mg every 12 hours as needed for muscle spasms.  Use caution as this medication can cause drowsiness. Light stretching.  May use moist heat on the area to help with pain. If symptoms worsen despite treatment or become more constant then recommend going to the emergency room for further evaluation May return to urgent care as needed.

## 2023-07-11 ENCOUNTER — Other Ambulatory Visit (HOSPITAL_COMMUNITY)
Admission: RE | Admit: 2023-07-11 | Discharge: 2023-07-11 | Disposition: A | Discharge status: Home / Self Care | Source: Ambulatory Visit | Attending: Family Medicine | Admitting: Family Medicine

## 2023-07-11 ENCOUNTER — Ambulatory Visit (INDEPENDENT_AMBULATORY_CARE_PROVIDER_SITE_OTHER): Admitting: Family Medicine

## 2023-07-11 ENCOUNTER — Encounter: Payer: Self-pay | Admitting: Family Medicine

## 2023-07-11 VITALS — BP 110/74 | HR 89 | Temp 98.7°F | Resp 16 | Ht 67.0 in | Wt 234.0 lb

## 2023-07-11 DIAGNOSIS — E66812 Obesity, class 2: Secondary | ICD-10-CM | POA: Diagnosis not present

## 2023-07-11 DIAGNOSIS — Z113 Encounter for screening for infections with a predominantly sexual mode of transmission: Secondary | ICD-10-CM | POA: Diagnosis present

## 2023-07-11 DIAGNOSIS — N898 Other specified noninflammatory disorders of vagina: Secondary | ICD-10-CM

## 2023-07-11 DIAGNOSIS — E6609 Other obesity due to excess calories: Secondary | ICD-10-CM

## 2023-07-11 DIAGNOSIS — B3731 Acute candidiasis of vulva and vagina: Secondary | ICD-10-CM | POA: Diagnosis not present

## 2023-07-11 DIAGNOSIS — Z124 Encounter for screening for malignant neoplasm of cervix: Secondary | ICD-10-CM | POA: Insufficient documentation

## 2023-07-11 DIAGNOSIS — Z6836 Body mass index (BMI) 36.0-36.9, adult: Secondary | ICD-10-CM

## 2023-07-11 DIAGNOSIS — Z7689 Persons encountering health services in other specified circumstances: Secondary | ICD-10-CM | POA: Diagnosis not present

## 2023-07-11 MED ORDER — PHENTERMINE HCL 37.5 MG PO TABS: 37.5000 mg | ORAL_TABLET | Freq: Every day | ORAL | 0 refills | Status: DC

## 2023-07-12 LAB — CERVICOVAGINAL ANCILLARY ONLY
Bacterial Vaginitis (gardnerella): NEGATIVE
Candida Glabrata: NEGATIVE
Candida Vaginitis: POSITIVE — AB
Chlamydia: NEGATIVE
Comment: NEGATIVE
Comment: NEGATIVE
Comment: NEGATIVE
Comment: NEGATIVE
Comment: NEGATIVE
Comment: NORMAL
Neisseria Gonorrhea: NEGATIVE
Trichomonas: NEGATIVE

## 2023-07-13 ENCOUNTER — Encounter: Payer: Self-pay | Admitting: Family Medicine

## 2023-07-13 LAB — CYTOLOGY - PAP
Adequacy: ABSENT
Diagnosis: NEGATIVE

## 2023-07-13 MED ORDER — FLUCONAZOLE 150 MG PO TABS
150.0000 mg | ORAL_TABLET | Freq: Once | ORAL | 0 refills | Status: AC
Start: 2023-07-13 — End: 2023-07-13

## 2023-07-13 NOTE — Progress Notes (Signed)
 Established Patient Office Visit  Subjective    Patient ID: Wendy Gutierrez, female    DOB: October 16, 1995  Age: 28 y.o. MRN: [id number]  CC:  Chief Complaint  Patient presents with   Gynecologic Exam    Pap smear, weight loss medication    HPI Wendy Gutierrez presents for routine follow up of weight management. Patient also would like pap with STD screen.   Outpatient Encounter Medications as of 07/11/2023  Medication Sig   phentermine (ADIPEX-P) 37.5 MG tablet Take 1 tablet (37.5 mg total) by mouth daily before breakfast.   predniSONE (DELTASONE) 20 MG tablet Take 2 tablets (40 mg total) by mouth daily with breakfast for 5 days.   fluconazole (DIFLUCAN) 150 MG tablet Take 1 tablet (150 mg total) by mouth every three (3) days as needed. (Patient not taking: Reported on 06/09/2023)   ibuprofen (ADVIL) 200 MG tablet Take 200 mg by mouth every 6 (six) hours as needed. (Patient not taking: Reported on 06/09/2023)   methocarbamol (ROBAXIN) 500 MG tablet Take 1 tablet (500 mg total) by mouth 2 (two) times daily. (Patient not taking: Reported on 07/11/2023)   metroNIDAZOLE (FLAGYL) 500 MG tablet Take 500 mg by mouth as directed. (Patient not taking: Reported on 07/11/2023)   norgestimate-ethinyl estradiol (SPRINTEC 28) 0.25-35 MG-MCG tablet Take 1 tablet by mouth daily. (Patient not taking: Reported on 07/11/2023)   No facility-administered encounter medications on file as of 07/11/2023.    Past Medical History:  Diagnosis Date   COVID 11/2019   fever x 2 days all symptoms resolved   Headache    Vitamin D deficiency     Past Surgical History:  Procedure Laterality Date   DILATION AND EVACUATION N/A 08/21/2020   Procedure: DILATATION AND EVACUATION;  Surgeon: Wendy Rein, MD;  Location: [facility];  Service: Gynecology;  Laterality: N/A;    Family History  Problem Relation Age of Onset   Healthy Mother    Breast cancer Maternal Grandmother    Diabetes Maternal  Uncle    Colon cancer Neg Hx    Stomach cancer Neg Hx    Esophageal cancer Neg Hx     Social History   Socioeconomic History   Marital status: Married    Spouse name: Not on file   Number of children: 0   Years of education: 13   Highest education level: Bachelor's degree (e.g., BA, AB, BS)  Occupational History   Occupation: Bakery   Occupation: Hostess   Occupation: Statistician  Tobacco Use   Smoking status: Never   Smokeless tobacco: Never  Vaping Use   Vaping status: Never Used  Substance and Sexual Activity   Alcohol use: Not Currently   Drug use: Not Currently   Sexual activity: Yes    Partners: Male    Birth control/protection: Condom    Comment: occasionally  Other Topics Concern   Not on file  Social History Narrative   ** Merged History Encounter **       Lives at home with mother. Right-handed. 2-3 cups caffeine per day.   Social Drivers of Corporate investment banker Strain: Low Risk  (06/09/2023)   Overall Financial Resource Strain (CARDIA)    Difficulty of Paying Living Expenses: Not very hard  Food Insecurity: No Food Insecurity (06/09/2023)   Hunger Vital Sign    Worried About Running Out of Food in the Last Year: Never true    Ran Out of Food in the Last Year: Never true  Transportation Needs: No Transportation Needs (06/09/2023)   PRAPARE - Administrator, Civil Service (Medical): No    Lack of Transportation (Non-Medical): No  Physical Activity: Insufficiently Active (06/09/2023)   Exercise Vital Sign    Days of Exercise per Week: 2 days    Minutes of Exercise per Session: 20 min  Stress: No Stress Concern Present (06/09/2023)   [employer] of Occupational Health - Occupational Stress Questionnaire    Feeling of Stress : Not at all  Social Connections: Moderately Integrated (06/09/2023)   Social Connection and Isolation Panel [NHANES]    Frequency of Communication with Friends and Family: More than three times a week    Frequency  of Social Gatherings with Friends and Family: Once a week    Attends Religious Services: More than 4 times per year    Active Member of [employer] or Organizations: No    Attends Banker Meetings: Not on file    Marital Status: Married  Catering manager Violence: Not At Risk (07/11/2023)   Humiliation, Afraid, Rape, and Kick questionnaire    Fear of Current or Ex-Partner: No    Emotionally Abused: No    Physically Abused: No    Sexually Abused: No    Review of Systems  All other systems reviewed and are negative.       Objective    BP 110/74   Pulse 89   Temp 98.7 F (37.1 C) (Oral)   Resp 16   Ht 5\' 7"  (1.702 m)   Wt 234 lb (106.1 kg)   LMP 06/30/2023 (Exact Date)   SpO2 96%   BMI 36.65 kg/m   Physical Exam Vitals and nursing note reviewed.  Constitutional:      General: She is not in acute distress.    Appearance: She is obese.  Cardiovascular:     Rate and Rhythm: Normal rate and regular rhythm.  Pulmonary:     Effort: Pulmonary effort is normal.     Breath sounds: Normal breath sounds.  Abdominal:     Palpations: Abdomen is soft.     Tenderness: There is no abdominal tenderness.  Neurological:     General: No focal deficit present.     Mental Status: She is alert and oriented to person, place, and time.         Assessment & Plan:   Screening for cervical cancer -     Cytology - PAP  Screening for STDs (sexually transmitted diseases) -     Cervicovaginal ancillary only  Encounter for weight management  Class 2 obesity due to excess calories without serious comorbidity with body mass index (BMI) of 36.0 to 36.9 in adult  Other orders -     Phentermine HCl; Take 1 tablet (37.5 mg total) by mouth daily before breakfast.  Dispense: 30 tablet; Refill: 0     Return in about 4 weeks (around 08/08/2023) for follow up, weight management.   Wendy Raymond, MD

## 2023-07-24 ENCOUNTER — Ambulatory Visit (HOSPITAL_BASED_OUTPATIENT_CLINIC_OR_DEPARTMENT_OTHER): Payer: BC Managed Care – PPO | Admitting: Cardiology

## 2023-07-24 ENCOUNTER — Encounter (HOSPITAL_BASED_OUTPATIENT_CLINIC_OR_DEPARTMENT_OTHER): Payer: Self-pay | Admitting: Cardiology

## 2023-07-24 VITALS — BP 120/70 | HR 86 | Ht 67.0 in | Wt 233.2 lb

## 2023-07-24 DIAGNOSIS — R0789 Other chest pain: Secondary | ICD-10-CM | POA: Diagnosis not present

## 2023-07-24 DIAGNOSIS — Z7189 Other specified counseling: Secondary | ICD-10-CM

## 2023-07-24 DIAGNOSIS — Z712 Person consulting for explanation of examination or test findings: Secondary | ICD-10-CM

## 2023-07-24 NOTE — Patient Instructions (Signed)
 Medication Instructions:  No Changes *If you need a refill on your cardiac medications before your next appointment, please call your pharmacy*  Lab Work: No Labs If you have labs (blood work) drawn today and your tests are completely normal, you will receive your results only by: MyChart Message (if you have MyChart) OR A paper copy in the mail If you have any lab test that is abnormal or we need to change your treatment, we will call you to review the results.  Testing/Procedures: No Testing  Follow-Up: At [facility], you and your health needs are our priority.  As part of our continuing mission to provide you with exceptional heart care, our providers are all part of one team.  This team includes your primary Cardiologist (physician) and Advanced Practice Providers or APPs (Physician Assistants and Nurse Practitioners) who all work together to provide you with the care you need, when you need it.  Your next appointment:   Follow Up As Needed  Provider:   Sheryle Donning, MD   We recommend signing up for the patient portal called "MyChart".  Sign up information is provided on this After Visit Summary.  MyChart is used to connect with patients for Virtual Visits (Telemedicine).  Patients are able to view lab/test results, encounter notes, upcoming appointments, etc.  Non-urgent messages can be sent to your provider as well.   To learn more about what you can do with MyChart, go to [email or web address].

## 2023-07-24 NOTE — Progress Notes (Signed)
  Cardiology Office Note:  .   Date:  07/24/2023  ID:  Wendy Gutierrez, DOB 02-05-1996, MRN [id number] PCP: Abraham Abo, MD  [facility] Providers Cardiologist:  Sheryle Donning, MD {  History of Present Illness: .   Wendy Gutierrez is a 28 y.o. female without prior cardiac history seen as a new patient consultation for chest pain.   Today: Referred after urgent care visit for chest pain. Has been recurrent for the last several months. Can be right or left sided. Pain is sharp, focal, can then become more of a dull ache. Worse with lifting. Went away for a time and then recently returned. Se has changed positions at work, now doing more lifting throughout her shift. Went to urgent care 07/08/23 with constant chest pain. ECG unremarkable. Exam noted reproducible tenderness. She was given steroids and a muscle relaxer. She didn't try the prednisone but did try the muscle relaxer.  No personal history of heart issues. No family history of heart issues. No smoking.   ROS: Denies shortness of breath at rest or with normal exertion. No PND, orthopnea, LE edema or unexpected weight gain. No syncope or palpitations. ROS otherwise negative except as noted.   Studies Reviewed: Aaron Aas    EKG:       Physical Exam:   VS:  BP 120/70   Pulse 86   Ht 5\' 7"  (1.702 m)   Wt 233 lb 3.2 oz (105.8 kg)   LMP 06/30/2023 (Exact Date)   SpO2 96%   BMI 36.52 kg/m    Wt Readings from Last 3 Encounters:  07/24/23 233 lb 3.2 oz (105.8 kg)  07/11/23 234 lb (106.1 kg)  06/09/23 232 lb 6.4 oz (105.4 kg)    GEN: Well nourished, well developed in no acute distress HEENT: Normal, moist mucous membranes NECK: No JVD CARDIAC: regular rhythm, normal S1 and S2, no rubs or gallops. No murmur. Tender to palpation with muscle knots focally across anterior chest VASCULAR: Radial and DP pulses 2+ bilaterally. No carotid bruits RESPIRATORY:  Clear to auscultation without rales, wheezing or rhonchi  ABDOMEN:  Soft, non-tender, non-distended MUSCULOSKELETAL:  Ambulates independently SKIN: Warm and dry, no edema NEUROLOGIC:  Alert and oriented x 3. No focal neuro deficits noted. PSYCHIATRIC:  Normal affect    ASSESSMENT AND PLAN: .    Chest pain, atypical -reviewed ER testing/evaluation -most consistent with MSK given symptoms, exam. Discussed conservative management of this -no high risk CV factors -reviewed red flag warning signs that need immediate medical attention  CV risk counseling and prevention -recommend heart healthy/Mediterranean diet, with whole grains, fruits, vegetable, fish, lean meats, nuts, and olive oil. Limit salt. -recommend moderate walking, 3-5 times/week for 30-50 minutes each session. Aim for at least 150 minutes.week. Goal should be pace of 3 miles/hours, or walking 1.5 miles in 30 minutes -recommend avoidance of tobacco products. Avoid excess alcohol.  Dispo: I would be happy to see her back as needed  Signed, Sheryle Donning, MD   Sheryle Donning, MD, PhD, [facility] [place]  [facility]  [place]  Heart & Vascular at [facility] at [facility] [address] [place], [place] [address] [phone]

## 2023-08-10 ENCOUNTER — Ambulatory Visit: Admitting: Family Medicine

## 2023-08-30 ENCOUNTER — Ambulatory Visit: Admitting: Family Medicine

## 2023-09-26 ENCOUNTER — Ambulatory Visit: Admitting: Family Medicine

## 2023-10-03 ENCOUNTER — Telehealth: Admitting: Family Medicine

## 2023-10-03 DIAGNOSIS — B9689 Other specified bacterial agents as the cause of diseases classified elsewhere: Secondary | ICD-10-CM

## 2023-10-03 DIAGNOSIS — N760 Acute vaginitis: Secondary | ICD-10-CM | POA: Diagnosis not present

## 2023-10-03 MED ORDER — METRONIDAZOLE 500 MG PO TABS: 500.0000 mg | ORAL_TABLET | Freq: Two times a day (BID) | ORAL | 0 refills | Status: AC

## 2023-10-03 NOTE — Progress Notes (Signed)
 Virtual Visit Consent   Wendy Gutierrez, you are scheduled for a virtual visit with a [facility] provider today. Just as with appointments in the office, your consent must be obtained to participate. Your consent will be active for this visit and any virtual visit you may have with one of our providers in the next 365 days. If you have a MyChart account, a copy of this consent can be sent to you electronically.  As this is a virtual visit, video technology does not allow for your provider to perform a traditional examination. This may limit your provider's ability to fully assess your condition. If your provider identifies any concerns that need to be evaluated in person or the need to arrange testing (such as labs, EKG, etc.), we will make arrangements to do so. Although advances in technology are sophisticated, we cannot ensure that it will always work on either your end or our end. If the connection with a video visit is poor, the visit may have to be switched to a telephone visit. With either a video or telephone visit, we are not always able to ensure that we have a secure connection.  By engaging in this virtual visit, you consent to the provision of healthcare and authorize for your insurance to be billed (if applicable) for the services provided during this visit. Depending on your insurance coverage, you may receive a charge related to this service.  I need to obtain your verbal consent now. Are you willing to proceed with your visit today? Wendy Gutierrez has provided verbal consent on 10/03/2023 for a virtual visit (video or telephone). Wendy CHRISTELLA Barefoot, NP  Date: 10/03/2023 8:12 AM   Virtual Visit via Video Note   I, Wendy Gutierrez, connected with  Wendy Gutierrez  ([id number], 18-Jan-1996) on 10/03/23 at  8:15 AM EDT by a video-enabled telemedicine application and verified that I am speaking with the correct person using two identifiers.  Location: Patient: Virtual Visit Location Patient:  Home Provider: Virtual Visit Location Provider: Home Office   I discussed the limitations of evaluation and management by telemedicine and the availability of in person appointments. The patient expressed understanding and agreed to proceed.    History of Present Illness: Wendy Gutierrez is a 28 y.o. who identifies as a female who was assigned female at birth, and is being seen today for changes in smell and discharge after cycle  Onset was after her cycle ended Saturday with odor and then thin white discharge  Associated symptoms are none Modifying factors are nothing Denies chest pain, shortness of breath, fevers, chills  Exposure to STI: no, and no new partners.  Thinks this was triggered by tampon use and cycle.   Problems:  Patient Active Problem List   Diagnosis Date Noted   Alpha thalassemia silent carrier 03/24/2021   Arthropathy of lumbar facet joint 07/09/2019   Low grade squamous intraepithelial lesion (LGSIL) on cervicovaginal cytologic smear 10/10/2016   Pregnancy 09/29/2016   Migraine without aura and without status migrainosus, not intractable 07/24/2014    Allergies:  Allergies  Allergen Reactions   Amoxicillin Rash   Medications:  Current Outpatient Medications:    phentermine  (ADIPEX-P ) 37.5 MG tablet, Take 1 tablet (37.5 mg total) by mouth daily before breakfast., Disp: 30 tablet, Rfl: 0  Observations/Objective: Patient is well-developed, well-nourished in no acute distress.  Resting comfortably  at home.  Head is normocephalic, atraumatic.  No labored breathing.  Speech is clear and coherent with logical content.  Patient  is alert and oriented at baseline.    Assessment and Plan:  1. Bacterial vaginosis (Primary)  - metroNIDAZOLE  (FLAGYL ) 500 MG tablet; Take 1 tablet (500 mg total) by mouth 2 (two) times daily for 7 days.  Dispense: 14 tablet; Refill: 0   -discussed causes and prevention -info on AVS -follow up with pcp if not  improving   Reviewed side effects, risks and benefits of medication.    Patient acknowledged agreement and understanding of the plan.   Past Medical, Surgical, Social History, Allergies, and Medications have been Reviewed.   Follow Up Instructions: I discussed the assessment and treatment plan with the patient. The patient was provided an opportunity to ask questions and all were answered. The patient agreed with the plan and demonstrated an understanding of the instructions.  A copy of instructions were sent to the patient via MyChart unless otherwise noted below.    The patient was advised to call back or seek an in-person evaluation if the symptoms worsen or if the condition fails to improve as anticipated.    Wendy CHRISTELLA Barefoot, NP

## 2023-10-03 NOTE — Patient Instructions (Addendum)
 [name], thank you for joining [name], NP for today's virtual visit.  While this provider is not your primary care provider (PCP), if your PCP is located in our provider database this encounter information will be shared with them immediately following your visit.   A [facility] MyChart account gives you access to today's visit and all your visits, tests, and labs performed at [facility]  click here if you don't have a [facility] MyChart account or go to [email or web address]  Consent: (Patient) Wendy Gutierrez provided verbal consent for this virtual visit at the beginning of the encounter.  Current Medications:  Current Outpatient Medications:    phentermine  (ADIPEX-P ) 37.5 MG tablet, Take 1 tablet (37.5 mg total) by mouth daily before breakfast., Disp: 30 tablet, Rfl: 0   Medications ordered in this encounter:  No orders of the defined types were placed in this encounter.    *If you need refills on other medications prior to your next appointment, please contact your pharmacy*  Follow-Up: Call back or seek an in-person evaluation if the symptoms worsen or if the condition fails to improve as anticipated.  [facility] [phone]  Other Instructions   Boric Acid Vaginal Suppositories What is this medication? BORIC ACID (BOHR ik AS id) may support vaginal health. It may relieve the symptoms of a yeast infection, such as itching, burning, and odor. This medicine may be used for other purposes; ask your health care provider or pharmacist if you have questions. COMMON BRAND NAME(S): AZO Boric Acid with Aloe Vera, Hylafem What should I tell my care team before I take this medication? They need to know if you have any of these conditions: Diabetes Frequent infections HIV or AIDS Immune system problems An unusual or allergic reaction to boric acid, other medications, foods, dyes, or preservatives Pregnant or trying to get  pregnant Breast-feeding How should I use this medication? This medication is for use in the vagina. Do not take by mouth. Follow the directions on the prescription label. Read package directions carefully before using. Wash hands before and after use. Use this medication at bedtime, unless otherwise directed by your care team. Do not use your medication more often than directed. Do not stop using this medication except on your care team's advice. Talk to your care team about the use of this medication in children. This medication is not approved for use in children. Overdosage: If you think you have taken too much of this medicine contact a poison control center or emergency room at once. NOTE: This medicine is only for you. Do not share this medicine with others. What if I miss a dose? If you miss a dose, use it as soon as you can. If it is almost time for your next dose, use only that dose. Do not use double or extra doses. What may interact with this medication? Interactions are not expected. Do not use any other vaginal products without telling your care team. This list may not describe all possible interactions. Give your health care provider a list of all the medicines, herbs, non-prescription drugs, or dietary supplements you use. Also tell them if you smoke, drink alcohol, or use illegal drugs. Some items may interact with your medicine. What should I watch for while using this medication? Tell your care team if your symptoms do not start to get better within a few days. It is better not to have sex until you have finished your treatment. This medication may  cause condoms, diaphragms, and spermicides to not work as well. Do not rely on any of these methods to prevent sexually transmitted infections (STIs) or pregnancy while you are using this medication. Vaginal medications may come out of the vagina during treatment. To keep the medication from getting on your clothing, wear a panty liner.  The use of tampons is not recommended. To help clear up the infection, wear freshly washed cotton, not synthetic, underwear. What side effects may I notice from receiving this medication? Side effects that you should report to your care team as soon as possible: Allergic reactions--skin rash, itching, hives, swelling of the face, lips, tongue, or throat Unusual vaginal discharge, itching, or odor Side effects that usually do not require medical attention (report to your care team if they continue or are bothersome): Vaginal irritation at the application site This list may not describe all possible side effects. Call your doctor for medical advice about side effects. You may report side effects to FDA at 1-800-FDA-1088. Where should I keep my medication? Keep out of the reach of children and pets. Store in a cool, dry place between 15 and 30 degrees C (59 and 86 degrees F). Keep away from sunlight. Throw away any unused medication after the expiration date. NOTE: This sheet is a summary. It may not cover all possible information. If you have questions about this medicine, talk to your doctor, pharmacist, or health care provider.  2024 Elsevier/Gold Standard (2021-03-15 00:00:00)   If you have been instructed to have an in-person evaluation today at a local Urgent Care facility, please use the link below. It will take you to a list of all of our available [facility], including address, phone number and hours of operation. Please do not delay care.  [facility]  If you or a family member do not have a primary care provider, use the link below to schedule a visit and establish care. When you choose a [facility] primary care physician or advanced practice provider, you gain a long-term partner in health. Find a Primary Care Provider  Learn more about [facility]'s in-office and virtual care options: [facility] - Get Care Now

## 2023-10-18 ENCOUNTER — Telehealth: Admitting: Physician Assistant

## 2023-10-18 DIAGNOSIS — B3731 Acute candidiasis of vulva and vagina: Secondary | ICD-10-CM

## 2023-10-18 MED ORDER — FLUCONAZOLE 150 MG PO TABS
ORAL_TABLET | ORAL | 0 refills | Status: DC
Start: 2023-10-18 — End: 2023-12-30

## 2023-10-18 NOTE — Patient Instructions (Signed)
 Wendy Gutierrez, thank you for joining Wendy Velma Lunger, PA-C for today's virtual visit.  While this provider is not your primary care provider (PCP), if your PCP is located in our provider database this encounter information will be shared with them immediately following your visit.   A [facility] MyChart account gives you access to today's visit and all your visits, tests, and labs performed at [facility]  click here if you don't have a [facility] MyChart account or go to [email or web address]  Consent: (Patient) Wendy Gutierrez provided verbal consent for this virtual visit at the beginning of the encounter.  Current Medications:  Current Outpatient Medications:    phentermine  (ADIPEX-P ) 37.5 MG tablet, Take 1 tablet (37.5 mg total) by mouth daily before breakfast., Disp: 30 tablet, Rfl: 0   Medications ordered in this encounter:  No orders of the defined types were placed in this encounter.    *If you need refills on other medications prior to your next appointment, please contact your pharmacy*  Follow-Up: Call back or seek an in-person evaluation if the symptoms worsen or if the condition fails to improve as anticipated.  [facility] [phone]  Other Instructions Vaginal Yeast Infection, Adult  Vaginal yeast infection is a condition that causes vaginal discharge as well as soreness, swelling, and redness (inflammation) of the vagina. This is a common condition. Some women get this infection frequently. What are the causes? This condition is caused by a change in the normal balance of the yeast (Candida) and normal bacteria that live in the vagina. This change causes an overgrowth of yeast, which causes the inflammation. What increases the risk? The condition is more likely to develop in women who: Take antibiotic medicines. Have diabetes. Take birth control pills. Are pregnant. Douche often. Have a weak body defense system (immune  system). Have been taking steroid medicines for a long time. Frequently wear tight clothing. What are the signs or symptoms? Symptoms of this condition include: White, thick, creamy vaginal discharge. Swelling, itching, redness, and irritation of the vagina. The lips of the vagina (labia) may be affected as well. Pain or a burning feeling while urinating. Pain during sex. How is this diagnosed? This condition is diagnosed based on: Your medical history. A physical exam. A pelvic exam. Your health care provider will examine a sample of your vaginal discharge under a microscope. Your health care provider may send this sample for testing to confirm the diagnosis. How is this treated? This condition is treated with medicine. Medicines may be over-the-counter or prescription. You may be told to use one or more of the following: Medicine that is taken by mouth (orally). Medicine that is applied as a cream (topically). Medicine that is inserted directly into the vagina (suppository). Follow these instructions at home: Take or apply over-the-counter and prescription medicines only as told by your health care provider. Do not use tampons until your health care provider approves. Do not have sex until your infection has cleared. Sex can prolong or worsen your symptoms of infection. Ask your health care provider when it is safe to resume sexual activity. Keep all follow-up visits. This is important. How is this prevented?  Do not wear tight clothes, such as pantyhose or tight pants. Wear breathable cotton underwear. Do not use douches, perfumed soap, creams, or powders. Wipe from front to back after using the toilet. If you have diabetes, keep your blood sugar levels under control. Ask your health care provider for other ways to  prevent yeast infections. Contact a health care provider if: You have a fever. Your symptoms go away and then return. Your symptoms do not get better with  treatment. Your symptoms get worse. You have new symptoms. You develop blisters in or around your vagina. You have blood coming from your vagina and it is not your menstrual period. You develop pain in your abdomen. Summary Vaginal yeast infection is a condition that causes discharge as well as soreness, swelling, and redness (inflammation) of the vagina. This condition is treated with medicine. Medicines may be over-the-counter or prescription. Take or apply over-the-counter and prescription medicines only as told by your health care provider. Do not douche. Resume sexual activity or use of tampons as instructed by your health care provider. Contact a health care provider if your symptoms do not get better with treatment or your symptoms go away and then return. This information is not intended to replace advice given to you by your health care provider. Make sure you discuss any questions you have with your health care provider. Document Revised: 06/15/2020 Document Reviewed: 06/15/2020 Elsevier Patient Education  2024 Elsevier Inc.   If you have been instructed to have an in-person evaluation today at a local Urgent Care facility, please use the link below. It will take you to a list of all of our available [facility], including address, phone number and hours of operation. Please do not delay care.  [facility]  If you or a family member do not have a primary care provider, use the link below to schedule a visit and establish care. When you choose a [facility] primary care physician or advanced practice provider, you gain a long-term partner in health. Find a Primary Care Provider  Learn more about [facility] in-office and virtual care options:  - Get Care Now

## 2023-10-18 NOTE — Progress Notes (Signed)
 Virtual Visit Consent   Wendy Gutierrez, you are scheduled for a virtual visit with a  provider today. Just as with appointments in the office, your consent must be obtained to participate. Your consent will be active for this visit and any virtual visit you may have with one of our providers in the next 365 days. If you have a MyChart account, a copy of this consent can be sent to you electronically.  As this is a virtual visit, video technology does not allow for your provider to perform a traditional examination. This may limit your provider's ability to fully assess your condition. If your provider identifies any concerns that need to be evaluated in person or the need to arrange testing (such as labs, EKG, etc.), we will make arrangements to do so. Although advances in technology are sophisticated, we cannot ensure that it will always work on either your end or our end. If the connection with a video visit is poor, the visit may have to be switched to a telephone visit. With either a video or telephone visit, we are not always able to ensure that we have a secure connection.  By engaging in this virtual visit, you consent to the provision of healthcare and authorize for your insurance to be billed (if applicable) for the services provided during this visit. Depending on your insurance coverage, you may receive a charge related to this service.  I need to obtain your verbal consent now. Are you willing to proceed with your visit today? Wendy Gutierrez has provided verbal consent on 10/18/2023 for a virtual visit (video or telephone). Wendy Gutierrez, [place]  Date: 10/18/2023 9:00 AM   Virtual Visit via Video Note   I, Wendy Gutierrez, connected with  Emer Onnen  ([id number], 15-Aug-1995) on 10/18/23 at  8:45 AM EDT by a video-enabled telemedicine application and verified that I am speaking with the correct person using two identifiers.  Location: Patient: Virtual Visit Location  Patient: Home Provider: Virtual Visit Location Provider: Home Office   I discussed the limitations of evaluation and management by telemedicine and the availability of in person appointments. The patient expressed understanding and agreed to proceed.    History of Present Illness: Wendy Gutierrez is a 28 y.o. who identifies as a female who was assigned female at birth, and is being seen today for concern of yeast infection. Was treated for suspected BV two weeks ago but only took a couple of days of Flagyl  as she did not feel was helping. Notes no vaginal odor. Notes thick, white discharge, itch and irritation. Discharge slightly improved now but continued itch. Denies concern for pregnancy or STI. States this feels like prior yeast infections she has had.  HPI: HPI  Problems:  Patient Active Problem List   Diagnosis Date Noted   Alpha thalassemia silent carrier 03/24/2021   Arthropathy of lumbar facet joint 07/09/2019   Low grade squamous intraepithelial lesion (LGSIL) on cervicovaginal cytologic smear 10/10/2016   Pregnancy 09/29/2016   Migraine without aura and without status migrainosus, not intractable 07/24/2014    Allergies:  Allergies  Allergen Reactions   Amoxicillin Rash   Medications:  Current Outpatient Medications:    fluconazole  (DIFLUCAN ) 150 MG tablet, Take 1 tablet PO once. Repeat in 3 days if needed., Disp: 2 tablet, Rfl: 0   phentermine  (ADIPEX-P ) 37.5 MG tablet, Take 1 tablet (37.5 mg total) by mouth daily before breakfast., Disp: 30 tablet, Rfl: 0  Observations/Objective: Patient is well-developed, well-nourished in  no acute distress.  Resting comfortably at home.  Head is normocephalic, atraumatic.  No labored breathing. Speech is clear and coherent with logical content.  Patient is alert and oriented at baseline.   Assessment and Plan: 1. Yeast vaginitis (Primary) - fluconazole  (DIFLUCAN ) 150 MG tablet; Take 1 tablet PO once. Repeat in 3 days if needed.   Dispense: 2 tablet; Refill: 0  Supportive measures reviewed. Diflucan  x 2 per orders. Will need in person evaluation for any non-resolving, new or worsening symptoms at this point.   Follow Up Instructions: I discussed the assessment and treatment plan with the patient. The patient was provided an opportunity to ask questions and all were answered. The patient agreed with the plan and demonstrated an understanding of the instructions.  A copy of instructions were sent to the patient via MyChart unless otherwise noted below.   The patient was advised to call back or seek an in-person evaluation if the symptoms worsen or if the condition fails to improve as anticipated.    Wendy Velma Lunger, PA-C

## 2023-10-27 ENCOUNTER — Ambulatory Visit: Admitting: Family Medicine

## 2023-10-27 VITALS — BP 117/71 | HR 64 | Ht 67.0 in | Wt 229.2 lb

## 2023-10-27 DIAGNOSIS — E66812 Obesity, class 2: Secondary | ICD-10-CM

## 2023-10-27 DIAGNOSIS — Z7689 Persons encountering health services in other specified circumstances: Secondary | ICD-10-CM | POA: Diagnosis not present

## 2023-10-27 DIAGNOSIS — Z6835 Body mass index (BMI) 35.0-35.9, adult: Secondary | ICD-10-CM

## 2023-10-27 DIAGNOSIS — E6609 Other obesity due to excess calories: Secondary | ICD-10-CM | POA: Diagnosis not present

## 2023-10-27 MED ORDER — PHENTERMINE HCL 37.5 MG PO TABS: 37.5000 mg | ORAL_TABLET | Freq: Every day | ORAL | 0 refills | Status: AC

## 2023-10-27 NOTE — Progress Notes (Signed)
 Established Patient Office Visit  Subjective    Patient ID: Wendy Gutierrez, female    DOB: Oct 08, 1995  Age: 28 y.o. MRN: [id number]  CC:  Chief Complaint  Patient presents with   Medical Management of Chronic Issues    No concerns    HPI Wendy Gutierrez presents for routine weight management. She has started with a program of diet and exercise and is wanting to restart phentermine  after a 3 month hiatus.   Outpatient Encounter Medications as of 10/27/2023  Medication Sig   [DISCONTINUED] phentermine  (ADIPEX-P ) 37.5 MG tablet Take 1 tablet (37.5 mg total) by mouth daily before breakfast.   fluconazole  (DIFLUCAN ) 150 MG tablet Take 1 tablet PO once. Repeat in 3 days if needed. (Patient not taking: Reported on 10/27/2023)   phentermine  (ADIPEX-P ) 37.5 MG tablet Take 1 tablet (37.5 mg total) by mouth daily before breakfast.   No facility-administered encounter medications on file as of 10/27/2023.    Past Medical History:  Diagnosis Date   COVID 11/2019   fever x 2 days all symptoms resolved   Headache    Vitamin D deficiency     Past Surgical History:  Procedure Laterality Date   DILATION AND EVACUATION N/A 08/21/2020   Procedure: DILATATION AND EVACUATION;  Surgeon: Danielle Rom, MD;  Location: [facility];  Service: Gynecology;  Laterality: N/A;    Family History  Problem Relation Age of Onset   Healthy Mother    Breast cancer Maternal Grandmother    Diabetes Maternal Uncle    Colon cancer Neg Hx    Stomach cancer Neg Hx    Esophageal cancer Neg Hx     Social History   Socioeconomic History   Marital status: Married    Spouse name: Not on file   Number of children: 0   Years of education: 13   Highest education level: Bachelor's degree (e.g., BA, AB, BS)  Occupational History   Occupation: Bakery   Occupation: Hostess   Occupation: Statistician  Tobacco Use   Smoking status: Never   Smokeless tobacco: Never  Vaping Use   Vaping  status: Never Used  Substance and Sexual Activity   Alcohol use: Not Currently   Drug use: Not Currently   Sexual activity: Yes    Partners: Male    Birth control/protection: Condom    Comment: occasionally  Other Topics Concern   Not on file  Social History Narrative   ** Merged History Encounter **       Lives at home with mother. Right-handed. 2-3 cups caffeine per day.   Social Drivers of Health   Financial Resource Strain: Medium Risk (10/27/2023)   Overall Financial Resource Strain (CARDIA)    Difficulty of Paying Living Expenses: Somewhat hard  Food Insecurity: No Food Insecurity (10/27/2023)   Hunger Vital Sign    Worried About Running Out of Food in the Last Year: Never true    Ran Out of Food in the Last Year: Never true  Transportation Needs: No Transportation Needs (10/27/2023)   PRAPARE - Administrator, Civil Service (Medical): No    Lack of Transportation (Non-Medical): No  Physical Activity: Insufficiently Active (10/27/2023)   Exercise Vital Sign    Days of Exercise per Week: 2 days    Minutes of Exercise per Session: 20 min  Stress: No Stress Concern Present (10/27/2023)   [employer] of Occupational Health - Occupational Stress Questionnaire    Feeling of Stress: Not at all  Social Connections: Moderately Integrated (10/27/2023)   Social Connection and Isolation Panel    Frequency of Communication with Friends and Family: Once a week    Frequency of Social Gatherings with Friends and Family: Twice a week    Attends Religious Services: More than 4 times per year    Active Member of [employer] or Organizations: No    Attends Banker Meetings: Not on file    Marital Status: Married  Catering manager Violence: Not At Risk (07/11/2023)   Humiliation, Afraid, Rape, and Kick questionnaire    Fear of Current or Ex-Partner: No    Emotionally Abused: No    Physically Abused: No    Sexually Abused: No    Review of Systems  All other  systems reviewed and are negative.       Objective    BP 117/71   Pulse 64   Ht 5' 7 (1.702 m)   Wt 229 lb 3.2 oz (104 kg)   LMP 10/22/2023   SpO2 97%   BMI 35.90 kg/m   Physical Exam Vitals and nursing note reviewed.  Constitutional:      General: She is not in acute distress.    Appearance: She is obese.  Cardiovascular:     Rate and Rhythm: Normal rate and regular rhythm.  Pulmonary:     Effort: Pulmonary effort is normal.     Breath sounds: Normal breath sounds.  Abdominal:     Palpations: Abdomen is soft.     Tenderness: There is no abdominal tenderness.  Neurological:     General: No focal deficit present.     Mental Status: She is alert and oriented to person, place, and time.         Assessment & Plan:   Encounter for weight management  Class 2 obesity due to excess calories without serious comorbidity with body mass index (BMI) of 35.0 to 35.9 in adult  Other orders -     Phentermine  HCl; Take 1 tablet (37.5 mg total) by mouth daily before breakfast.  Dispense: 30 tablet; Refill: 0     Return in about 4 weeks (around 11/24/2023) for follow up, weight management.   Tanda Raguel SQUIBB, MD

## 2023-10-31 ENCOUNTER — Ambulatory Visit: Admitting: Family Medicine

## 2023-11-27 ENCOUNTER — Ambulatory Visit: Admitting: Family Medicine

## 2023-12-30 ENCOUNTER — Telehealth: Admitting: Nurse Practitioner

## 2023-12-30 DIAGNOSIS — B3731 Acute candidiasis of vulva and vagina: Secondary | ICD-10-CM | POA: Diagnosis not present

## 2023-12-30 MED ORDER — FLUCONAZOLE 150 MG PO TABS: ORAL_TABLET | ORAL | 0 refills | Status: AC

## 2023-12-30 NOTE — Progress Notes (Signed)
 Virtual Visit Consent   Wendy Gutierrez, you are scheduled for a virtual visit with a [facility] provider today. Just as with appointments in the office, your consent must be obtained to participate. Your consent will be active for this visit and any virtual visit you may have with one of our providers in the next 365 days. If you have a MyChart account, a copy of this consent can be sent to you electronically.  As this is a virtual visit, video technology does not allow for your provider to perform a traditional examination. This may limit your provider's ability to fully assess your condition. If your provider identifies any concerns that need to be evaluated in person or the need to arrange testing (such as labs, EKG, etc.), we will make arrangements to do so. Although advances in technology are sophisticated, we cannot ensure that it will always work on either your end or our end. If the connection with a video visit is poor, the visit may have to be switched to a telephone visit. With either a video or telephone visit, we are not always able to ensure that we have a secure connection.  By engaging in this virtual visit, you consent to the provision of healthcare and authorize for your insurance to be billed (if applicable) for the services provided during this visit. Depending on your insurance coverage, you may receive a charge related to this service.  I need to obtain your verbal consent now. Are you willing to proceed with your visit today? Lexee Brashears has provided verbal consent on 12/30/2023 for a virtual visit (video or telephone). Wendy LELON Servant, NP  Date: 12/30/2023 4:26 PM   Virtual Visit via Video Note   I, Wendy Gutierrez, connected with  Wendy Gutierrez  ([id number], May 26, 1995) on 12/30/23 at  4:15 PM EDT by a video-enabled telemedicine application and verified that I am speaking with the correct person using two identifiers.  Location: Patient: Virtual Visit Location  Patient: Home Provider: Virtual Visit Location Provider: Home Office   I discussed the limitations of evaluation and management by telemedicine and the availability of in person appointments. The patient expressed understanding and agreed to proceed.    History of Present Illness: Wendy Gutierrez is a 28 y.o. who identifies as a female who was assigned female at birth, and is being seen today for symptoms of yeast vaginitis.  Over the past few days Ms. Valent has been experiencing vaginal irritation and itching.  She recently changed her detergent and feels this may have contributed to the altering of the pH in her vagina.     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 Repeat in 3 days if needed., Disp: 2 tablet, Rfl: 0   phentermine  (ADIPEX-P ) 37.5 MG tablet, Take 1 tablet (37.5 mg total) by mouth daily before breakfast., Disp: 30 tablet, Rfl: 0  Observations/Objective: Patient is well-developed, well-nourished in no acute distress.  Resting comfortably at home.  Head is normocephalic, atraumatic.  No labored breathing.  Speech is clear and  coherent with logical content.  Patient is alert and oriented at baseline.    Assessment and Plan: 1. Yeast vaginitis - fluconazole  (DIFLUCAN ) 150 MG tablet; Take 1 tablet PO once. Repeat in 3 days if needed.  Dispense: 2 tablet; Refill: 0    Follow Up Instructions: I discussed the assessment and treatment plan with the patient. The patient was  provided an opportunity to ask questions and all were answered. The patient agreed with the plan and demonstrated an understanding of the instructions.  A copy of instructions were sent to the patient via MyChart unless otherwise noted below.    The patient was advised to call back or seek an in-person evaluation if the symptoms worsen or if the condition fails to improve as anticipated.    Angelia Hazell W Franca Stakes, NP

## 2023-12-30 NOTE — Patient Instructions (Signed)
  [name], thank you for joining Haze LELON Servant, NP for today's virtual visit.  While this provider is not your primary care provider (PCP), if your PCP is located in our provider database this encounter information will be shared with them immediately following your visit.   A [facility] MyChart account gives you access to today's visit and all your visits, tests, and labs performed at [facility]  click here if you don't have a [facility] MyChart account or go to [email or web address]  Consent: (Patient) Wendy Gutierrez provided verbal consent for this virtual visit at the beginning of the encounter.  Current Medications:  Current Outpatient Medications:    fluconazole  (DIFLUCAN ) 150 MG tablet, Take 1 tablet PO once. Repeat in 3 days if needed., Disp: 2 tablet, Rfl: 0   phentermine  (ADIPEX-P ) 37.5 MG tablet, Take 1 tablet (37.5 mg total) by mouth daily before breakfast., Disp: 30 tablet, Rfl: 0   Medications ordered in this encounter:  Meds ordered this encounter  Medications   fluconazole  (DIFLUCAN ) 150 MG tablet    Sig: Take 1 tablet PO once. Repeat in 3 days if needed.    Dispense:  2 tablet    Refill:  0    Supervising Provider:   LAMPTEY, PHILIP O [[id number]]     *If you need refills on other medications prior to your next appointment, please contact your pharmacy*  Follow-Up: Call back or seek an in-person evaluation if the symptoms worsen or if the condition fails to improve as anticipated.  [facility] [phone]    If you have been instructed to have an in-person evaluation today at a local Urgent Care facility, please use the link below. It will take you to a list of all of our available [facility], including address, phone number and hours of operation. Please do not delay care.  [facility]  If you or a family member do not have a primary care provider, use the link below to schedule a visit and  establish care. When you choose a [facility] primary care physician or advanced practice provider, you gain a long-term partner in health. Find a Primary Care Provider  Learn more about [facility] in-office and virtual care options: [facility] - Get Care Now

## 2024-01-26 ENCOUNTER — Ambulatory Visit: Admitting: Family Medicine

## 2024-03-28 ENCOUNTER — Ambulatory Visit: Admitting: Family Medicine

## 2024-06-17 ENCOUNTER — Ambulatory Visit: Admitting: Family Medicine
